# Patient Record
Sex: Female | Born: 1975
Health system: Southern US, Community
[De-identification: ages and names within clinical notes are randomized; demographics above are authoritative.]

## PROBLEM LIST (undated history)

## (undated) ENCOUNTER — Emergency Department (HOSPITAL_COMMUNITY): Disposition: A | Discharge status: Still In Hospital | Payer: BC Managed Care – PPO

## (undated) DIAGNOSIS — L709 Acne, unspecified: Secondary | ICD-10-CM

## (undated) DIAGNOSIS — M2550 Pain in unspecified joint: Secondary | ICD-10-CM

## (undated) DIAGNOSIS — F419 Anxiety disorder, unspecified: Secondary | ICD-10-CM

## (undated) DIAGNOSIS — R51 Headache: Secondary | ICD-10-CM

## (undated) DIAGNOSIS — Z975 Presence of (intrauterine) contraceptive device: Secondary | ICD-10-CM

## (undated) DIAGNOSIS — G4700 Insomnia, unspecified: Secondary | ICD-10-CM

## (undated) DIAGNOSIS — D259 Leiomyoma of uterus, unspecified: Secondary | ICD-10-CM

## (undated) DIAGNOSIS — E669 Obesity, unspecified: Secondary | ICD-10-CM

## (undated) DIAGNOSIS — I10 Essential (primary) hypertension: Secondary | ICD-10-CM

## (undated) DIAGNOSIS — Z8709 Personal history of other diseases of the respiratory system: Secondary | ICD-10-CM

## (undated) HISTORY — PX: HERNIA REPAIR: SHX51

## (undated) HISTORY — DX: Anxiety disorder, unspecified: F41.9

## (undated) HISTORY — PX: WISDOM TOOTH EXTRACTION: SHX21

## (undated) HISTORY — DX: Pain in unspecified joint: M25.50

## (undated) HISTORY — DX: Personal history of other diseases of the respiratory system: Z87.09

## (undated) HISTORY — DX: Headache: R51

## (undated) HISTORY — DX: Presence of (intrauterine) contraceptive device: Z97.5

## (undated) HISTORY — DX: Insomnia, unspecified: G47.00

## (undated) HISTORY — PX: BREAST REDUCTION SURGERY: SHX8

## (undated) HISTORY — DX: Acne, unspecified: L70.9

## (undated) HISTORY — DX: Leiomyoma of uterus, unspecified: D25.9

## (undated) HISTORY — DX: Obesity, unspecified: E66.9

## (undated) HISTORY — DX: Essential (primary) hypertension: I10

---

## 1997-10-03 ENCOUNTER — Inpatient Hospital Stay (HOSPITAL_COMMUNITY): Admission: AD | Admit: 1997-10-03 | Discharge: 1997-10-03 | Payer: Self-pay | Admitting: Obstetrics & Gynecology

## 1997-12-02 ENCOUNTER — Inpatient Hospital Stay (HOSPITAL_COMMUNITY): Admission: AD | Admit: 1997-12-02 | Discharge: 1997-12-02 | Payer: Self-pay

## 1998-01-08 ENCOUNTER — Inpatient Hospital Stay (HOSPITAL_COMMUNITY): Admission: AD | Admit: 1998-01-08 | Discharge: 1998-01-08 | Payer: Self-pay | Admitting: Obstetrics & Gynecology

## 1998-03-17 ENCOUNTER — Inpatient Hospital Stay (HOSPITAL_COMMUNITY): Admission: AD | Admit: 1998-03-17 | Discharge: 1998-03-17 | Payer: Self-pay | Admitting: *Deleted

## 1998-03-22 ENCOUNTER — Emergency Department (HOSPITAL_COMMUNITY): Admission: EM | Admit: 1998-03-22 | Discharge: 1998-03-22 | Payer: Self-pay | Admitting: Emergency Medicine

## 1998-04-09 ENCOUNTER — Inpatient Hospital Stay (HOSPITAL_COMMUNITY): Admission: AD | Admit: 1998-04-09 | Discharge: 1998-04-09 | Payer: Self-pay | Admitting: Obstetrics & Gynecology

## 1998-05-05 ENCOUNTER — Inpatient Hospital Stay (HOSPITAL_COMMUNITY): Admission: AD | Admit: 1998-05-05 | Discharge: 1998-05-05 | Payer: Self-pay | Admitting: *Deleted

## 1998-05-08 ENCOUNTER — Inpatient Hospital Stay (HOSPITAL_COMMUNITY): Admission: AD | Admit: 1998-05-08 | Discharge: 1998-05-08 | Payer: Self-pay | Admitting: Obstetrics

## 1998-05-09 ENCOUNTER — Inpatient Hospital Stay (HOSPITAL_COMMUNITY): Admission: AD | Admit: 1998-05-09 | Discharge: 1998-05-11 | Payer: Self-pay | Admitting: Obstetrics

## 1998-08-29 HISTORY — PX: REDUCTION MAMMAPLASTY: SUR839

## 1998-12-22 ENCOUNTER — Emergency Department (HOSPITAL_COMMUNITY): Admission: EM | Admit: 1998-12-22 | Discharge: 1998-12-22 | Payer: Self-pay | Admitting: Emergency Medicine

## 1998-12-30 ENCOUNTER — Encounter: Payer: Self-pay | Admitting: Emergency Medicine

## 1998-12-30 ENCOUNTER — Emergency Department (HOSPITAL_COMMUNITY): Admission: EM | Admit: 1998-12-30 | Discharge: 1998-12-30 | Payer: Self-pay | Admitting: Emergency Medicine

## 1999-08-12 ENCOUNTER — Inpatient Hospital Stay (HOSPITAL_COMMUNITY): Admission: EM | Admit: 1999-08-12 | Discharge: 1999-08-12 | Payer: Self-pay | Admitting: Obstetrics & Gynecology

## 1999-08-19 ENCOUNTER — Encounter: Admission: RE | Admit: 1999-08-19 | Discharge: 1999-08-19 | Payer: Self-pay | Admitting: Obstetrics

## 1999-08-19 ENCOUNTER — Other Ambulatory Visit: Admission: RE | Admit: 1999-08-19 | Discharge: 1999-08-19 | Payer: Self-pay | Admitting: Obstetrics

## 1999-09-16 ENCOUNTER — Encounter: Admission: RE | Admit: 1999-09-16 | Discharge: 1999-09-16 | Payer: Self-pay | Admitting: Obstetrics

## 1999-10-12 ENCOUNTER — Inpatient Hospital Stay (HOSPITAL_COMMUNITY): Admission: EM | Admit: 1999-10-12 | Discharge: 1999-10-12 | Payer: Self-pay | Admitting: *Deleted

## 2000-06-24 ENCOUNTER — Encounter (INDEPENDENT_AMBULATORY_CARE_PROVIDER_SITE_OTHER): Payer: Self-pay | Admitting: Specialist

## 2000-06-24 ENCOUNTER — Observation Stay (HOSPITAL_COMMUNITY): Admission: RE | Admit: 2000-06-24 | Discharge: 2000-06-25 | Payer: Self-pay | Admitting: Specialist

## 2001-09-09 ENCOUNTER — Emergency Department (HOSPITAL_COMMUNITY): Admission: EM | Admit: 2001-09-09 | Discharge: 2001-09-09 | Payer: Self-pay | Admitting: Emergency Medicine

## 2002-03-14 ENCOUNTER — Emergency Department (HOSPITAL_COMMUNITY): Admission: EM | Admit: 2002-03-14 | Discharge: 2002-03-15 | Payer: Self-pay | Admitting: Emergency Medicine

## 2002-04-09 ENCOUNTER — Inpatient Hospital Stay (HOSPITAL_COMMUNITY): Admission: AD | Admit: 2002-04-09 | Discharge: 2002-04-09 | Payer: Self-pay | Admitting: *Deleted

## 2002-05-29 ENCOUNTER — Inpatient Hospital Stay (HOSPITAL_COMMUNITY): Admission: AD | Admit: 2002-05-29 | Discharge: 2002-05-29 | Payer: Self-pay | Admitting: Obstetrics and Gynecology

## 2002-05-31 ENCOUNTER — Inpatient Hospital Stay (HOSPITAL_COMMUNITY): Admission: AD | Admit: 2002-05-31 | Discharge: 2002-05-31 | Payer: Self-pay | Admitting: Obstetrics and Gynecology

## 2003-01-04 ENCOUNTER — Inpatient Hospital Stay: Admission: AD | Admit: 2003-01-04 | Discharge: 2003-01-04 | Payer: Self-pay | Admitting: Family Medicine

## 2003-04-17 ENCOUNTER — Emergency Department (HOSPITAL_COMMUNITY): Admission: EM | Admit: 2003-04-17 | Discharge: 2003-04-17 | Payer: Self-pay | Admitting: Emergency Medicine

## 2003-06-10 ENCOUNTER — Emergency Department (HOSPITAL_COMMUNITY): Admission: EM | Admit: 2003-06-10 | Discharge: 2003-06-10 | Payer: Self-pay | Admitting: Emergency Medicine

## 2003-07-04 ENCOUNTER — Emergency Department (HOSPITAL_COMMUNITY): Admission: EM | Admit: 2003-07-04 | Discharge: 2003-07-04 | Payer: Self-pay | Admitting: Emergency Medicine

## 2003-11-23 ENCOUNTER — Emergency Department (HOSPITAL_COMMUNITY): Admission: EM | Admit: 2003-11-23 | Discharge: 2003-11-23 | Payer: Self-pay | Admitting: Emergency Medicine

## 2004-01-13 ENCOUNTER — Emergency Department (HOSPITAL_COMMUNITY): Admission: EM | Admit: 2004-01-13 | Discharge: 2004-01-13 | Payer: Self-pay | Admitting: Emergency Medicine

## 2004-01-28 ENCOUNTER — Emergency Department (HOSPITAL_COMMUNITY): Admission: EM | Admit: 2004-01-28 | Discharge: 2004-01-28 | Payer: Self-pay | Admitting: Family Medicine

## 2004-03-10 ENCOUNTER — Emergency Department (HOSPITAL_COMMUNITY): Admission: EM | Admit: 2004-03-10 | Discharge: 2004-03-10 | Payer: Self-pay | Admitting: Emergency Medicine

## 2004-07-24 ENCOUNTER — Emergency Department (HOSPITAL_COMMUNITY): Admission: EM | Admit: 2004-07-24 | Discharge: 2004-07-24 | Payer: Self-pay | Admitting: Emergency Medicine

## 2004-08-02 ENCOUNTER — Emergency Department (HOSPITAL_COMMUNITY): Admission: EM | Admit: 2004-08-02 | Discharge: 2004-08-02 | Payer: Self-pay

## 2005-01-01 ENCOUNTER — Emergency Department (HOSPITAL_COMMUNITY): Admission: EM | Admit: 2005-01-01 | Discharge: 2005-01-01 | Payer: Self-pay | Admitting: Family Medicine

## 2005-01-03 ENCOUNTER — Emergency Department (HOSPITAL_COMMUNITY): Admission: EM | Admit: 2005-01-03 | Discharge: 2005-01-03 | Payer: Self-pay | Admitting: Family Medicine

## 2006-08-21 ENCOUNTER — Inpatient Hospital Stay (HOSPITAL_COMMUNITY): Admission: AD | Admit: 2006-08-21 | Discharge: 2006-08-21 | Payer: Self-pay | Admitting: Obstetrics

## 2007-09-20 ENCOUNTER — Emergency Department (HOSPITAL_COMMUNITY): Admission: EM | Admit: 2007-09-20 | Discharge: 2007-09-20 | Payer: Self-pay | Admitting: Family Medicine

## 2007-09-25 ENCOUNTER — Emergency Department (HOSPITAL_COMMUNITY): Admission: EM | Admit: 2007-09-25 | Discharge: 2007-09-25 | Payer: Self-pay | Admitting: Emergency Medicine

## 2007-12-28 HISTORY — PX: REDUCTION MAMMAPLASTY: SUR839

## 2007-12-29 ENCOUNTER — Emergency Department (HOSPITAL_COMMUNITY): Admission: EM | Admit: 2007-12-29 | Discharge: 2007-12-29 | Payer: Self-pay | Admitting: Emergency Medicine

## 2008-01-08 ENCOUNTER — Ambulatory Visit (HOSPITAL_BASED_OUTPATIENT_CLINIC_OR_DEPARTMENT_OTHER): Admission: RE | Admit: 2008-01-08 | Discharge: 2008-01-08 | Payer: Self-pay | Admitting: Plastic Surgery

## 2008-01-08 ENCOUNTER — Encounter (INDEPENDENT_AMBULATORY_CARE_PROVIDER_SITE_OTHER): Payer: Self-pay | Admitting: Plastic Surgery

## 2008-12-18 ENCOUNTER — Inpatient Hospital Stay (HOSPITAL_COMMUNITY): Admission: AD | Admit: 2008-12-18 | Discharge: 2008-12-18 | Payer: Self-pay | Admitting: Obstetrics & Gynecology

## 2008-12-19 ENCOUNTER — Inpatient Hospital Stay (HOSPITAL_COMMUNITY): Admission: AD | Admit: 2008-12-19 | Discharge: 2008-12-19 | Payer: Self-pay | Admitting: Obstetrics and Gynecology

## 2009-03-18 ENCOUNTER — Inpatient Hospital Stay (HOSPITAL_COMMUNITY): Admission: RE | Admit: 2009-03-18 | Discharge: 2009-03-20 | Payer: Self-pay | Admitting: Obstetrics and Gynecology

## 2010-12-05 LAB — CBC
HCT: 36.2 % (ref 36.0–46.0)
HCT: 38.3 % (ref 36.0–46.0)
Hemoglobin: 12.2 g/dL (ref 12.0–15.0)
Hemoglobin: 13.1 g/dL (ref 12.0–15.0)
MCHC: 33.6 g/dL (ref 30.0–36.0)
MCHC: 34.1 g/dL (ref 30.0–36.0)
MCV: 93.9 fL (ref 78.0–100.0)
MCV: 94.5 fL (ref 78.0–100.0)
Platelets: 150 10*3/uL (ref 150–400)
Platelets: 168 10*3/uL (ref 150–400)
RBC: 3.83 MIL/uL — ABNORMAL LOW (ref 3.87–5.11)
RBC: 4.08 MIL/uL (ref 3.87–5.11)
RDW: 14.7 % (ref 11.5–15.5)
RDW: 15.3 % (ref 11.5–15.5)
WBC: 5.8 10*3/uL (ref 4.0–10.5)
WBC: 8.9 10*3/uL (ref 4.0–10.5)

## 2010-12-05 LAB — RPR: RPR Ser Ql: NONREACTIVE

## 2011-01-11 NOTE — Op Note (Signed)
NAME:  Alisha Coffey, Alisha Coffey                ACCOUNT NO.:  [id number]   MEDICAL RECORD NO.:  [id number]          PATIENT TYPE:  AMB   LOCATION:  DSC                          FACILITY:  [facility]   PHYSICIAN:  Mary Contogiannis, M.D.DATE OF BIRTH:  1976/03/31   DATE OF PROCEDURE:  01/08/2008  DATE OF DISCHARGE:                               OPERATIVE REPORT   PREOPERATIVE DIAGNOSIS:  Recurrent bilateral macromastia.   POSTOPERATIVE DIAGNOSIS:  Recurrent bilateral macromastia.   PROCEDURE:  Bilateral reduction mammoplasties.   ANESTHESIA:  General endotracheal.   ANESTHESIOLOGIST:  Guadalupe Maple, MD   ESTIMATED BLOOD LOSS:  250 mL.   FLUID REPLACEMENT:  3800 mL of crystalloid.   URINE OUTPUT:  220 mL.   COMPLICATIONS:  None.   INDICATIONS FOR PROCEDURE:  The patient is a 35 year old African  American female who has had a previous bilateral reduction mammoplasty  surgery.  She has recurrent bilateral macromastia that is clinically  symptomatic.  She presents to undergo a repeat bilateral reduction  mammoplasty.  Operative note indicates that she has had a previous  inferior pedicle technique and we will repeat the same technique with  this procedure.   PROCEDURE:  The patient was marked in the preop holding area for the  future bilateral reduction mammoplasties.  She was then taken back to  the OR and placed on the table in the supine position.  After adequate  general endotracheal anesthesia was obtained, the chest was prepped with  Techni-Care and draped in sterile fashion.  The base of the breasts were  injected with 1% lidocaine with epinephrine.  After adequate hemostasis,  anesthesia taken effect, the procedure was begun.   Both the breast reductions were performed in the following similar  manner.  The nipple-areolar complex was marked with a 45-mm nipple  marker.  This was then incised and the skin was then de-epithelialized  around the nipple-areolar complex down to the  inframammary crease in the  inferior pedicle pattern.  Next, the medial, superior, and lateral skin  flaps were elevated down to the chest wall.  Some of the skin and  dissection was limited due to the previous incisions.  The nipple-  areolar complex was examined and found to be pink and viable.  The wound  was irrigated with saline irrigation.  Meticulous hemostasis was  obtained with the Bovie electrocautery.  The inferior pedicle was  centralized using 3-0 Prolene suture.  A #10 JP flat fully fluted drain  was placed into the wound.  The skin flaps were brought together  inverted T junction with a 2-0 Prolene suture.  The incisions were  stapled for temporary closure.  The dermal layer was tacked together  using a 3-0 Monocryl suture.  The breasts were compared and found to  have good shape and symmetry.  The incision was closed from the medial  aspect of the JP drain to the medial aspect of the [facility] incision using a  quill 2-0 PDO barbed suture to close both the dermal and cuticular layer  in a single stitch.  Lateral to the JP drain  incision was closed with 3-  0 Monocryl in the dermal layer followed by a 3-0 Monocryl running  intracuticular stitch on the skin.   The patient was placed in the upright position.  She had had a mastopexy  after the previous bilateral reduction mammoplasty surgery in an attempt  to provide better symmetry to her breasts and nipples by the previous  surgeon and sewn to a certain extent.  Some of the preoperative markings  were limited by the previous abnormally high location of her nipple-  areolar complexes preop.  However, I adjusted this as best as possible  to give her a more normal positioning to the nipple-areolar complexes as  they were marked on the breast mounds.  The 42-mm nipple marker was used  for marking the future nipple-areolar complexes.  The patient was then  placed back into the recumbent position.   Both of the nipple-areolar  complexes were brought onto the breast mound  in the following similar manner.  The skin was incised as marked full  thickness into the subcutaneous tissues and removed.  The nipple-areolar  complex was examined and found to be pink and viable and brought out  through this aperture and sewn in place using 4-0 Monocryl in the dermal  layer followed by a 5-0 Monocryl running intracuticular stitch on the  skin.  The dermal layer of the vertical limb of the wise pattern  incision had been closed with a 3-0 Monocryl suture and then the  cuticular layer was closed with a 5-0 Monocryl suture in continuity with  the nipple-areolar closure.  The JP drain was sewn in place using 3-0  nylon suture.  The base of the breasts and skin and soft tissues were  then infiltrated with 0.5% Marcaine with epinephrine additionally into  the axillary areas.  There were no complications.  The patient tolerated  the procedure well.   The patient was then extubated and taken to the recovery room in stable  condition.  She was recovered without complications.  She does have the  option of staying vernight if she would like in the [facility] for aggressive  pain control along with ambulation and monitoring of the nipples and  breast flaps.  However, additionally, she also has the option of going  home if she feels like it.  The patient will decide on this and may  leave accordingly with her family if she would like.   Followup appointment will be on Friday in the office.           ______________________________  Brantley Persons, M.D.     MC/MEDQ  D:  01/08/2008  T:  01/09/2008  Job:  [id number]

## 2011-01-11 NOTE — Discharge Summary (Signed)
NAME:  Alisha Coffey, Alisha Coffey                ACCOUNT NO.:  [id number]   MEDICAL RECORD NO.:  [id number]          PATIENT TYPE:  INP   LOCATION:  9126                          FACILITY:  [facility]   PHYSICIAN:  Malachi Pro. Ambrose Mantle, M.D. DATE OF BIRTH:  1976/07/27   DATE OF ADMISSION:  03/18/2009  DATE OF DISCHARGE:  03/20/2009                               DISCHARGE SUMMARY   HISTORY:  This is a 35 year old black female, para 1-0-1-1 gravida 3,  estimated gestational age [redacted] weeks by 7-week ultrasound with EDC on March 24, 2009, presented for elective induction with a borderline favorable  cervix.  RPR nonreactive, hepatitis B surface antigen negative, rubella  equivocal, HIV negative, B positive blood group and type with a negative  antibody, GC negative, Chlamydia positive with a negative test of cure,  1-hour Glucola 86, and group B strep positive.  Prenatal care was  complicated by premature dilatation of the cervix with a negative fetal  fibronectin.  She received betamethasone at 26 weeks.   Her past medical history revealed no known allergies.  She had had a  breast reduction and wisdom teeth extraction.  She was on no medication.  She has a history of leiomyomata uteri.   OBSTETRICAL HISTORY:  In 1999, she had a spontaneous vaginal delivery at  40 weeks with a 7 pounds 13 ounces infant with no complications.  She  also had a spontaneous abortion.   On admission, her vital signs were normal.  Heart and lungs were normal.  The abdomen was soft.  Estimated fetal weight, by Dr. Jackelyn Knife, was  about 8 pounds, the cervix was 3 cm, 50%, and vertex at a -3.  The  patient was placed on penicillin and Pitocin by 1:25 p.m. she was  feeling some contractions.  Contractions every 3-5 minutes.  Cervix was  3-4 cm, 50%, and vertex at a -2 to -3.  Artificial rupture of membranes  produced clear fluid.  The patient progressed in active labor, received  an epidural, reached complete dilatation and pushed well  and had a  spontaneous vaginal delivery of a living female infant 7 pounds 0  ounces, Apgars of 9 at 1 and 9 at 5 minutes, Dr. Jackelyn Knife was in  attendance.  Placenta was spontaneous and intact.  Cord blood donation  was obtained.  Small periurethral laceration repaired with 3-0 Vicryl  for hemostasis.  Blood loss about 500 mL.  Postpartum, the patient did  well and was discharged on the second postpartum day.  RPR was  nonreactive.  Hemoglobin on admission 13.1, hematocrit 33, white count  58, 100, and platelet count 168,000.  Followup hemoglobin 12.2 and  platelet count 150,000.   FINAL DIAGNOSIS:  Intrauterine pregnancy at 39 weeks, delivered vertex.   OPERATION:  Spontaneous delivery vertex, repair of periurethral  laceration.   FINAL CONDITION:  Improved.   INSTRUCTIONS:  Our regular discharge instruction booklet.  The patient  was given Darvocet-N 100, 30 tablets 1 every 4-6 hours as needed for  pain and Motrin 600 mg 30 tablets 1 every 6 hours as needed for  pain at  discharge.  She was advised to return to the office in 6 weeks for  followup examination.      Malachi Pro. Ambrose Mantle, M.D.  Electronically Signed     TFH/MEDQ  D:  03/20/2009  T:  03/20/2009  Job:  [id number]

## 2011-01-14 NOTE — Op Note (Signed)
[facility]  Patient:    Alisha Coffey, Alisha Coffey                       MRN: [id number] Proc. Date: 06/24/00 Adm. Date:  [id number] Disc. Date: [id number] Attending:  Gustavus Messing                           Operative Report  INDICATIONS:                  The patient has increased macromastia, back and shoulder pain, secondary to large pendulous breasts.  She also has increased accessory breast tissue.  PROCEDURE PLANNED:            Bilateral breast reduction using the inferior pedicle technique.  SURGEON:                      Yaakov Guthrie. Shon Hough, M.D.  DESCRIPTION OF PROCEDURE:     Preoperatively the patient was set up and drawn for the inferior pedicle technique, remarking the areolar-nipple complexes back to 21.0 cm from the nipple-areolar complex, to the suprasternal notch. She then underwent general anesthesia and was intubated orally.  A prep was done to the chest and breast areas in a routine fashion, using Betadine soap and solution, and walled off with sterile towels and drapes, so as to make a sterile field.  Xylocaine 0.5% with epinephrine was injected locally, a total of 50 cc per side.  After this was placed in for vasoconstriction, the wounds were scored with a #15 blade.  The skin over the inferior pedicle was de-epithelialized with a #20 blade.  The medial and lateral fatty dermal pedicles were excised down to the underlying fascia.  After this the new key hole area was also debulked out laterally.  More accessory breast tissue was removed, and improved symmetry.  We removed over 1150 g on the left side, and approximately 950 g on the right side.  The wound were brought back together with #2-0 and #3-0 Vicryl subcutaneously, and the skin edges were reapproximated with a subcuticular stitch of #3-0 Vicryl and #5-0 Vicryl throughout the inverted T.  The wounds were drained with #10 Blake drains, which were placed in the depths of the  wound and brought out through the lateral-most portion of the incisions and secured with #3-0 Prolene.  She withstood the procedures very well and was taken to the recovery room in excellent condition.  Sterile dressings were applied to all the areas.  The estimated blood loss was 150 cc.  The patient had good nutrition on the end of her vasculature to her nipple-areolar complexes postoperatively. DD:  07/29/00 TD:  07/29/00 Job: [id number]

## 2011-01-14 NOTE — Discharge Summary (Signed)
[facility]  Patient:    ODETTA, FORNESS                       MRN: [id number] Adm. Date:  [id number] Disc. Date: [id number] Attending:  Gustavus Messing CC:         Yaakov Guthrie. Shon Hough, M.D. (2)   Discharge Summary  HOSPITAL COURSE:              This 35 year old lady underwent bilateral breast reductions on June 24, 2000, and did well postoperatively.  She was discharged from the hospital on June 25, 2000.  CONDITION ON DISCHARGE:  Improved.   DISCHARGE DIAGNOSIS:  Severe macromastia.  DISCHARGE MEDICATIONS: 1. Tylox one tablet p.o. q.4h. p.r.n. pain #30. 2. Duricef 500 mg b.i.d. for five days.  FOLLOWUP:  Follow up in my office in three days.  The patient is to call me for any medical problems. DD:  07/29/00 TD:  07/29/00 Job: [id number]

## 2011-10-04 ENCOUNTER — Ambulatory Visit (INDEPENDENT_AMBULATORY_CARE_PROVIDER_SITE_OTHER): Payer: BC Managed Care – PPO | Admitting: Medical

## 2011-10-04 ENCOUNTER — Encounter: Payer: Self-pay | Admitting: Medical

## 2011-10-04 VITALS — BP 128/88 | HR 80 | Temp 99.0°F | Resp 16 | Ht 66.0 in | Wt 199.0 lb

## 2011-10-04 DIAGNOSIS — L708 Other acne: Secondary | ICD-10-CM

## 2011-10-04 DIAGNOSIS — J309 Allergic rhinitis, unspecified: Secondary | ICD-10-CM | POA: Insufficient documentation

## 2011-10-04 DIAGNOSIS — G8929 Other chronic pain: Secondary | ICD-10-CM | POA: Insufficient documentation

## 2011-10-04 DIAGNOSIS — R21 Rash and other nonspecific skin eruption: Secondary | ICD-10-CM

## 2011-10-04 DIAGNOSIS — R51 Headache: Secondary | ICD-10-CM

## 2011-10-04 DIAGNOSIS — R519 Headache, unspecified: Secondary | ICD-10-CM | POA: Insufficient documentation

## 2011-10-04 DIAGNOSIS — L709 Acne, unspecified: Secondary | ICD-10-CM | POA: Insufficient documentation

## 2011-10-04 DIAGNOSIS — Z0000 Encounter for general adult medical examination without abnormal findings: Secondary | ICD-10-CM | POA: Insufficient documentation

## 2011-10-04 LAB — CBC WITH DIFFERENTIAL/PLATELET
Basophils Absolute: 0 10*3/uL (ref 0.0–0.1)
Basophils Relative: 0 % (ref 0–1)
Eosinophils Absolute: 0.1 10*3/uL (ref 0.0–0.7)
Eosinophils Relative: 2 % (ref 0–5)
HCT: 40.6 % (ref 36.0–46.0)
Hemoglobin: 13.4 g/dL (ref 12.0–15.0)
Lymphocytes Relative: 40 % (ref 12–46)
Lymphs Abs: 1.4 10*3/uL (ref 0.7–4.0)
MCH: 30.6 pg (ref 26.0–34.0)
MCHC: 33 g/dL (ref 30.0–36.0)
MCV: 92.7 fL (ref 78.0–100.0)
Monocytes Absolute: 0.2 10*3/uL (ref 0.1–1.0)
Monocytes Relative: 4 % (ref 3–12)
Neutro Abs: 1.9 10*3/uL (ref 1.7–7.7)
Neutrophils Relative %: 54 % (ref 43–77)
Platelets: 213 10*3/uL (ref 150–400)
RBC: 4.38 MIL/uL (ref 3.87–5.11)
RDW: 12.7 % (ref 11.5–15.5)
WBC: 3.5 10*3/uL — ABNORMAL LOW (ref 4.0–10.5)

## 2011-10-04 LAB — TSH: TSH: 0.76 u[IU]/mL (ref 0.350–4.500)

## 2011-10-04 LAB — LIPID PANEL
Cholesterol: 211 mg/dL — ABNORMAL HIGH (ref 0–200)
HDL: 42 mg/dL (ref >39)
LDL Cholesterol: 155 mg/dL — ABNORMAL HIGH (ref 0–99)
Total CHOL/HDL Ratio: 5 Ratio
Triglycerides: 69 mg/dL (ref <150)
VLDL: 14 mg/dL (ref 0–40)

## 2011-10-04 LAB — POCT URINALYSIS DIPSTICK
Bilirubin, UA: NEGATIVE
Glucose, UA: NEGATIVE
Ketones, UA: NEGATIVE
Leukocytes, UA: NEGATIVE
Nitrite, UA: NEGATIVE
Protein, UA: NEGATIVE
Spec Grav, UA: 1.015
Urobilinogen, UA: NEGATIVE
pH, UA: 7

## 2011-10-04 LAB — COMPREHENSIVE METABOLIC PANEL
ALT: 20 U/L (ref 0–35)
AST: 19 U/L (ref 0–37)
Albumin: 4.4 g/dL (ref 3.5–5.2)
Alkaline Phosphatase: 55 U/L (ref 39–117)
BUN: 11 mg/dL (ref 6–23)
CO2: 24 mEq/L (ref 19–32)
Calcium: 9.5 mg/dL (ref 8.4–10.5)
Chloride: 107 mEq/L (ref 96–112)
Creat: 0.79 mg/dL (ref 0.50–1.10)
Glucose, Bld: 92 mg/dL (ref 70–99)
Potassium: 4.2 mEq/L (ref 3.5–5.3)
Sodium: 139 mEq/L (ref 135–145)
Total Bilirubin: 0.5 mg/dL (ref 0.3–1.2)
Total Protein: 7.4 g/dL (ref 6.0–8.3)

## 2011-10-04 MED ORDER — CLINDAMYCIN PHOSPHATE 1 % EX GEL: Freq: Two times a day (BID) | CUTANEOUS | Status: AC

## 2011-10-04 MED ORDER — TRIAMCINOLONE ACETONIDE 0.1 % EX CREA: TOPICAL_CREAM | Freq: Two times a day (BID) | CUTANEOUS | Status: AC

## 2011-10-04 NOTE — Patient Instructions (Signed)
Rash - use the Triamcinolone cream I prescribed for the next week.  If not improving let me know.    Headache - try reduce stress where possible in your daily routine/life.  Try and start exercising regularly.   Avoid too much caffeine, don't skip meals.   Keep a headache diary, and begin Zyrtec 10mg  daily at bedtime OTC for allergies as this could also be contributing to your headaches.   If not improving in 1 mo, return with yore headache diary.   Acne - continue twice daily mild soap and water facial washes, continue Benzoyl Peroxide facial wash daily, and begin Clindagel prescription twice daily for the next month.    We will call with lab results.   Preventative Care for Adults - Female      MAINTAIN REGULAR HEALTH EXAMS:  A routine yearly physical is a good way to check in with your primary care provider about your health and preventive screening. It is also an opportunity to share updates about your health and any concerns you have, and receive a thorough all-over exam.   Most health insurance companies pay for at least some preventative services.  Check with your health plan for specific coverages.  WHAT PREVENTATIVE SERVICES DO WOMEN NEED?  Adult women should have their weight and blood pressure checked regularly.   Women age 25 and older should have their cholesterol levels checked regularly.  Women should be screened for cervical cancer with a Pap smear and pelvic exam beginning at either age 79, or 3 years after they become sexually activity.    Breast cancer screening generally begins at age 33 with a mammogram and breast exam by your primary care provider.    Beginning at age 6 and continuing to age 85, women should be screened for colorectal cancer.  Certain people may need continued testing until age 6.  Updating vaccinations is part of preventative care.  Vaccinations help protect against diseases such as the flu.  Osteoporosis is a disease in which the bones lose  minerals and strength as we age. Women ages 17 and over should discuss this with their caregivers, as should women after menopause who have other risk factors.  Lab tests are generally done as part of preventative care to screen for anemia and blood disorders, to screen for problems with the kidneys and liver, to screen for bladder problems, to check blood sugar, and to check your cholesterol level.  Preventative services generally include counseling about diet, exercise, avoiding tobacco, drugs, excessive alcohol consumption, and sexually transmitted infections.    GENERAL RECOMMENDATIONS FOR GOOD HEALTH:  Healthy diet:  Eat a variety of foods, including fruit, vegetables, animal or vegetable protein, such as meat, fish, chicken, and eggs, or beans, lentils, tofu, and grains, such as rice.  Drink plenty of water daily.  Decrease saturated fat in the diet, avoid lots of red meat, processed foods, sweets, fast foods, and fried foods.  Exercise:  Aerobic exercise helps maintain good heart health. At least 30-40 minutes of moderate-intensity exercise is recommended. For example, a brisk walk that increases your heart rate and breathing. This should be done on most days of the week.   Find a type of exercise or a variety of exercises that you enjoy so that it becomes a part of your daily life.  Examples are running, walking, swimming, water aerobics, and biking.  For motivation and support, explore group exercise such as aerobic class, spin class, Zumba, Yoga,or  martial arts, etc.  Set exercise goals for yourself, such as a certain weight goal, walk or run in a race such as a 5k walk/run.  Speak to your primary care provider about exercise goals.  Disease prevention:  If you smoke or chew tobacco, find out from your caregiver how to quit. It can literally save your life, no matter how long you have been a tobacco user. If you do not use tobacco, never begin.   Maintain a healthy diet and  normal weight. Increased weight leads to problems with blood pressure and diabetes.   The Body Mass Index or BMI is a way of measuring how much of your body is fat. Having a BMI above 27 increases the risk of heart disease, diabetes, hypertension, stroke and other problems related to obesity. Your caregiver can help determine your BMI and based on it develop an exercise and dietary program to help you achieve or maintain this important measurement at a healthful level.  High blood pressure causes heart and blood vessel problems.  Persistent high blood pressure should be treated with medicine if weight loss and exercise do not work.   Fat and cholesterol leaves deposits in your arteries that can block them. This causes heart disease and vessel disease elsewhere in your body.  If your cholesterol is found to be high, or if you have heart disease or certain other medical conditions, then you may need to have your cholesterol monitored frequently and be treated with medication.   Ask if you should have a cardiac stress test if your history suggests this. A stress test is a test done on a treadmill that looks for heart disease. This test can find disease prior to there being a problem.  Menopause can be associated with physical symptoms and risks. Hormone replacement therapy is available to decrease these. You should talk to your caregiver about whether starting or continuing to take hormones is right for you.   Osteoporosis is a disease in which the bones lose minerals and strength as we age. This can result in serious bone fractures. Risk of osteoporosis can be identified using a bone density scan. Women ages 39 and over should discuss this with their caregivers, as should women after menopause who have other risk factors. Ask your caregiver whether you should be taking a calcium supplement and Vitamin D, to reduce the rate of osteoporosis.   Avoid drinking alcohol in excess (more than two drinks per  day).  Avoid use of street drugs. Do not share needles with anyone. Ask for professional help if you need assistance or instructions on stopping the use of alcohol, cigarettes, and/or drugs.  Brush your teeth twice a day with fluoride toothpaste, and floss once a day. Good oral hygiene prevents tooth decay and gum disease. The problems can be painful, unattractive, and can cause other health problems. Visit your dentist for a routine oral and dental check up and preventive care every 6-12 months.   Look at your skin regularly.  Use a mirror to look at your back. Notify your caregivers of changes in moles, especially if there are changes in shapes, colors, a size larger than a pencil eraser, an irregular border, or development of new moles.  Safety:  Use seatbelts 100% of the time, whether driving or as a passenger.  Use safety devices such as hearing protection if you work in environments with loud noise or significant background noise.  Use safety glasses when doing any work that could send debris in to the  eyes.  Use a helmet if you ride a bike or motorcycle.  Use appropriate safety gear for contact sports.  Talk to your caregiver about gun safety.  Use sunscreen with a SPF (or skin protection factor) of 15 or greater.  Lighter skinned people are at a greater risk of skin cancer. Don't forget to also wear sunglasses in order to protect your eyes from too much damaging sunlight. Damaging sunlight can accelerate cataract formation.   Practice safe sex. Use condoms. Condoms are used for birth control and to help reduce the spread of sexually transmitted infections (or STIs).  Some of the STIs are gonorrhea (the clap), chlamydia, syphilis, trichomonas, herpes, HPV (human papilloma virus) and HIV (human immunodeficiency virus) which causes AIDS. The herpes, HIV and HPV are viral illnesses that have no cure. These can result in disability, cancer and death.   Keep carbon monoxide and smoke detectors in  your home functioning at all times. Change the batteries every 6 months or use a model that plugs into the wall.   Vaccinations:  Stay up to date with your tetanus shots and other required immunizations. You should have a booster for tetanus every 10 years. Be sure to get your flu shot every year, since 5%-20% of the [place] population comes down with the flu. The flu vaccine changes each year, so being vaccinated once is not enough. Get your shot in the fall, before the flu season peaks.   Other vaccines to consider:  Human Papilloma Virus or HPV causes cancer of the cervix, and other infections that can be transmitted from person to person. There is a vaccine for HPV, and females should get immunized between the ages of 18 and 10. It requires a series of 3 shots.   Pneumococcal vaccine to protect against certain types of pneumonia.  This is normally recommended for adults age 31 or older.  However, adults younger than 34 years old with certain underlying conditions such as diabetes, heart or lung disease should also receive the vaccine.  Shingles vaccine to protect against Varicella Zoster if you are older than age 4, or younger than 34 years old with certain underlying illness.  Hepatitis A vaccine to protect against a form of infection of the liver by a virus acquired from food.  Hepatitis B vaccine to protect against a form of infection of the liver by a virus acquired from blood or body fluids, particularly if you work in health care.  If you plan to travel internationally, check with your local health department for specific vaccination recommendations.  Cancer Screening:  Breast cancer screening is essential to preventive care for women. All women age 17 and older should perform a breast self-exam every month. At age 74 and older, women should have their caregiver complete a breast exam each year. Women at ages 2 and older should have a mammogram (x-ray film) of the breasts. Your  caregiver can discuss how often you need mammograms.    Cervical cancer screening includes taking a Pap smear (sample of cells examined under a microscope) from the cervix (end of the uterus). It also includes testing for HPV (Human Papilloma Virus, which can cause cervical cancer). Screening and a pelvic exam should begin at age 33, or 3 years after a woman becomes sexually active. Screening should occur every year, with a Pap smear but no HPV testing, up to age 69. After age 32, you should have a Pap smear every 3 years with HPV testing, if no HPV  was found previously.   Most routine colon cancer screening begins at the age of 24. On a yearly basis, doctors may provide special easy to use take-home tests to check for hidden blood in the stool. Sigmoidoscopy or colonoscopy can detect the earliest forms of colon cancer and is life saving. These tests use a small camera at the end of a tube to directly examine the colon. Speak to your caregiver about this at age 72, when routine screening begins (and is repeated every 5 years unless early forms of pre-cancerous polyps or small growths are found).

## 2011-10-04 NOTE — Progress Notes (Signed)
Subjective:   HPI  Alisha Coffey is a 36 y.o. female who presents for a complete physical.  Here as a new patient today.  No primary care in 10+ years.  Just would go to hospital in past for acute illness.  She has multiple concerns today.     She notes being overweight, wants help with losing weight.  She notes rash on her left abdomen that comes and goes.  It does improve with hydrocortisone, but it will come back.  Denies blisters, it is itchy at times though, no particular triggers or exposures.  She notes fatigue for months.  She exercise about 15 minutes daily with walking her dog.   She notes headaches ongoing for 3 years.  Headaches started after her last pregnancy.  Gets headache about 1-2/wk, last 3-5 days, usually one sided, and at times gets blurry vision.  She does have some allergy symptoms with runny nose, nasal congestion, and some sneezing.   She drinks 2 cups of coffee daily.  She uses Ibuprofen or Excedrin for headaches.  She gets boils under her arms.  Has had to have some lanced in the past.  No hx/o MRSA.  She sees Dr. Jackelyn Knife for gynecology, going for pap smear today, has IUD in place.  Last eye exam 2009, last dental visit last month.    Reviewed their medical, surgical, family, social, medication, and allergy history and updated chart as appropriate.  Past Medical History  Diagnosis Date  . History of sinusitis   . Chronic headache   . Joint pain   . IUD (intrauterine device) in place     Mirena  . Uterine fibroid     Past Surgical History  Procedure Date  . Wisdom tooth extraction   . Breast reduction surgery 2009, 2000    Family History  Problem Relation Age of Onset  . Diabetes Mother     borderline  . Diabetes Father     borderline  . Depression Maternal Grandmother   . Hypertension Maternal Grandmother   . Diabetes Maternal Grandfather   . Hypertension Maternal Grandfather   . Stroke Maternal Aunt   . Cancer Neg Hx   . Heart disease Neg Hx      History   Social History  . Marital Status: Single    Spouse Name: N/A    Number of Children: N/A  . Years of Education: N/A   Occupational History  . retailer     [employer]   Social History Main Topics  . Smoking status: Never Smoker   . Smokeless tobacco: Not on file  . Alcohol Use: 0.6 oz/week    1 Glasses of wine per week  . Drug Use: No  . Sexually Active: Not on file   Other Topics Concern  . Not on file   Social History Narrative   2 daughters, Elizbeth Squires age 27, Timaria age 2yo    No current outpatient prescriptions on file prior to visit.    No Known Allergies  Review of Systems Constitutional: -fever, -chills, -sweats, -unexpected weight change, -anorexia, +fatigue Allergy: +sneezing, -itching, +congestion Dermatology: denies changing moles, rash, lumps, no new worrisome lesions ENT: -runny nose, -ear pain, -sore throat, -hoarseness, -sinus pain, -teeth pain, -tinnitus, -hearing loss, -epistaxis Cardiology:  -chest pain, -palpitations, -edema, -orthopnea, -paroxysmal nocturnal dyspnea Respiratory: -cough, -shortness of breath, -dyspnea on exertion, -wheezing, -hemoptysis Gastroenterology: -abdominal pain, -nausea, -vomiting, -diarrhea, -constipation, -blood in stool, -changes in bowel movement, -dysphagia Hematology: -bleeding or bruising problems  Musculoskeletal: -arthralgias, +myalgias, -joint swelling, -back pain, -neck pain, -cramping, -gait changes Ophthalmology: -vision changes, -eye redness, -itching, -discharge Urology: -dysuria, -difficulty urinating, -hematuria, -urinary frequency, -urgency, incontinence Neurology: +headache, -weakness, +tingling, +numbness, -speech abnormality, -memory loss, -falls, -dizziness Psychology:  -depressed mood, -agitation, +sleep problems     Objective:   Physical Exam  Filed Vitals:   10/04/11 0859  BP: 128/88  Pulse: 80  Temp: 99 F (37.2 C)  Resp: 16    General appearance: alert, no distress, WD/WN,  overweight black female, pleasant Skin: tattoos on right wrist, upper arm, behind left ear, left lower abdomen/flank with large patch of raised papular rough skin without induration, warmth, or fluctuance, otherwise no worrisome lesions HEENT: normocephalic, conjunctiva/corneas normal, sclerae anicteric, PERRLA, EOMi, nares patent, no discharge or erythema, pharynx normal Oral cavity: MMM, tongue normal, teeth in good repair Neck: supple, no lymphadenopathy, no thyromegaly, no masses, normal ROM, no bruits Chest: non tender, normal shape and expansion Heart: RRR, normal S1, S2, no murmurs Lungs: CTA bilaterally, no wheezes, rhonchi, or rales Abdomen: +bs, soft, non tender, non distended, no masses, no hepatomegaly, no splenomegaly, no bruits Back: non tender, normal ROM, no scoliosis Musculoskeletal: upper extremities non tender, no obvious deformity, normal ROM throughout, lower extremities non tender, no obvious deformity, normal ROM throughout Extremities: no edema, no cyanosis, no clubbing Pulses: 2+ symmetric, upper and lower extremities, normal cap refill Neurological: alert, oriented x 3, CN2-12 intact, strength normal upper extremities and lower extremities, sensation normal throughout, DTRs 2+ throughout, no cerebellar signs, gait normal Psychiatric: normal affect, behavior normal, pleasant  Breast, gyn, rectal - deferred to gyn   Assessment and Plan :    Encounter Diagnoses  Name Primary?  . Routine general medical examination at a health care facility Yes  . Acne   . Rash   . Chronic headaches   . Allergic rhinitis    Physical exam - discussed healthy lifestyle, diet, exercise, preventative care, vaccinations, and addressed their concerns.  Handout given.  Acne - BID facial wash with mild soap and water, dailiy OTC benzoyl peroxide cleanser, and begin Clindamycin gel x 38mo.  Recheck 38mo.  Rash - script for triamcinolone, rash appears inflammatory from unknown  trigger.  Chronic headaches - begin Zyrtec QHS, keep headache diary, discussed stress reduction, increasing exercise, avoid too much caffeine, and recheck 38mo.  Allergic rhinitis - begin Zyrtec QHS.  Follow-up pending labs, RTC 38mo.

## 2011-11-04 ENCOUNTER — Other Ambulatory Visit: Payer: Self-pay | Admitting: Medical

## 2011-11-04 ENCOUNTER — Telehealth: Payer: Self-pay | Admitting: Medical

## 2011-11-04 MED ORDER — OSELTAMIVIR PHOSPHATE 75 MG PO CAPS: 75.0000 mg | ORAL_CAPSULE | Freq: Two times a day (BID) | ORAL | Status: AC

## 2011-11-04 NOTE — Telephone Encounter (Signed)
PT'S 2 KIDS JUST GOT OVER THE FLU. THEY WERE ON TAMIFLU FOR IT. [facility] CAN VERIFY THIS INFO PER PT. ONE OF THE KIDS HAVE BEEN SLEEPING WITH HER. YESTERDAY STARTED SNEEZING. 3:00 AM THIS MORNING FEVER STARTED. NOW HAS RUNNY NOSE, CHILLS, BODY ACHES ALL OVER. PT [place] AND UNABLE TO GET OFF WORK FOR APPT UNTIL 6:00 TODAY AND REALIZE WE WILL BE CLOSED. WANT TO KNOW IF [facility] CAN AUTHORIZE TAMIFLU MED BASED ON SYMPTOMS JUST STARTING TODAY AND KNOWING KIDS HAD FLU. PEDIATRIC OFFICE CAN VERIFY THAT INFO IF [facility] NEED TO DO SO.            [email or web address] [address]

## 2011-11-04 NOTE — Telephone Encounter (Signed)
tamiflu sent

## 2012-08-25 ENCOUNTER — Encounter (HOSPITAL_COMMUNITY): Payer: Self-pay | Admitting: Emergency Medicine

## 2012-08-25 ENCOUNTER — Emergency Department (HOSPITAL_COMMUNITY)
Admission: EM | Admit: 2012-08-25 | Discharge: 2012-08-25 | Disposition: A | Discharge status: Home / Self Care | Payer: BC Managed Care – PPO | Attending: Emergency Medicine | Admitting: Emergency Medicine

## 2012-08-25 DIAGNOSIS — Z309 Encounter for contraceptive management, unspecified: Secondary | ICD-10-CM | POA: Insufficient documentation

## 2012-08-25 DIAGNOSIS — R51 Headache: Secondary | ICD-10-CM | POA: Insufficient documentation

## 2012-08-25 DIAGNOSIS — Z975 Presence of (intrauterine) contraceptive device: Secondary | ICD-10-CM | POA: Insufficient documentation

## 2012-08-25 DIAGNOSIS — Z8709 Personal history of other diseases of the respiratory system: Secondary | ICD-10-CM | POA: Insufficient documentation

## 2012-08-25 DIAGNOSIS — M25519 Pain in unspecified shoulder: Secondary | ICD-10-CM | POA: Insufficient documentation

## 2012-08-25 DIAGNOSIS — T148XXA Other injury of unspecified body region, initial encounter: Secondary | ICD-10-CM

## 2012-08-25 DIAGNOSIS — Z8742 Personal history of other diseases of the female genital tract: Secondary | ICD-10-CM | POA: Insufficient documentation

## 2012-08-25 DIAGNOSIS — Y929 Unspecified place or not applicable: Secondary | ICD-10-CM | POA: Insufficient documentation

## 2012-08-25 DIAGNOSIS — M2550 Pain in unspecified joint: Secondary | ICD-10-CM | POA: Insufficient documentation

## 2012-08-25 DIAGNOSIS — Z791 Long term (current) use of non-steroidal anti-inflammatories (NSAID): Secondary | ICD-10-CM | POA: Insufficient documentation

## 2012-08-25 DIAGNOSIS — S139XXA Sprain of joints and ligaments of unspecified parts of neck, initial encounter: Secondary | ICD-10-CM | POA: Insufficient documentation

## 2012-08-25 DIAGNOSIS — Y33XXXA Other specified events, undetermined intent, initial encounter: Secondary | ICD-10-CM | POA: Insufficient documentation

## 2012-08-25 DIAGNOSIS — IMO0002 Reserved for concepts with insufficient information to code with codable children: Secondary | ICD-10-CM | POA: Insufficient documentation

## 2012-08-25 DIAGNOSIS — Y939 Activity, unspecified: Secondary | ICD-10-CM | POA: Insufficient documentation

## 2012-08-25 DIAGNOSIS — Z978 Presence of other specified devices: Secondary | ICD-10-CM | POA: Insufficient documentation

## 2012-08-25 MED ORDER — DIAZEPAM 5 MG PO TABS: 5.0000 mg | ORAL_TABLET | Freq: Two times a day (BID) | ORAL | Status: DC

## 2012-08-25 NOTE — ED Provider Notes (Signed)
History     CSN: [id number]  Arrival date & time 08/25/12  1903   First MD Initiated Contact with Patient 08/25/12 1933      Chief Complaint  Patient presents with  . Neck Pain  . Shoulder Pain    (Consider location/radiation/quality/duration/timing/severity/associated sxs/prior treatment) HPI Comments: Patient reports that she has been having pain over the right trapezius for the past 5 days.  She reports that she has been under increased stress.  The pain radiates to the right side of her neck.  Pain worse with movement.  She has applied ice and heat to the area, which she feels helps.  She has also been taking Ibuprofen, which she does not feel helps.  She denies acute injury.  Denies numbness or tingling.  Denies chest pain.  Denies fever or chills.  Denies weakness.    Patient is a 36 y.o. female presenting with neck pain and shoulder pain. The history is provided by the patient.  Neck Pain  Pertinent negatives include no numbness.  Shoulder Pain Associated symptoms include neck pain. Pertinent negatives include no chills, fever, nausea, numbness, rash or vomiting.    Past Medical History  Diagnosis Date  . History of sinusitis   . Chronic headache   . Joint pain   . IUD (intrauterine device) in place     Mirena  . Uterine fibroid     Past Surgical History  Procedure Date  . Wisdom tooth extraction   . Breast reduction surgery 2009, 2000    Family History  Problem Relation Age of Onset  . Diabetes Mother     borderline  . Diabetes Father     borderline  . Depression Maternal Grandmother   . Hypertension Maternal Grandmother   . Diabetes Maternal Grandfather   . Hypertension Maternal Grandfather   . Stroke Maternal Aunt   . Cancer Neg Hx   . Heart disease Neg Hx     History  Substance Use Topics  . Smoking status: Never Smoker   . Smokeless tobacco: Not on file  . Alcohol Use: 0.6 oz/week    1 Glasses of wine per week    OB History    Grav Para  Term Preterm Abortions TAB SAB Ect Mult Living                  Review of Systems  Constitutional: Negative for fever and chills.  HENT: Positive for neck pain.   Gastrointestinal: Negative for nausea and vomiting.  Musculoskeletal:       Right upper back pain  Skin: Negative for color change and rash.  Neurological: Negative for numbness.    Allergies  Darvocet  Home Medications   Current Outpatient Rx  Name  Route  Sig  Dispense  Refill  . IBUPROFEN 200 MG PO TABS   Oral   Take 800 mg by mouth every 6 (six) hours as needed. Pain         . CLINDAMYCIN PHOSPHATE 1 % EX GEL   Topical   Apply topically 2 (two) times daily.   30 g   0   . TRIAMCINOLONE ACETONIDE 0.1 % EX CREA   Topical   Apply topically 2 (two) times daily.   30 g   0     BP 130/77  Pulse 77  Temp 99.3 F (37.4 C) (Oral)  Resp 14  SpO2 100%  LMP 07/29/2012  Physical Exam  Nursing note and vitals reviewed. Constitutional: She appears well-developed  and well-nourished. No distress.  HENT:  Head: Normocephalic and atraumatic.  Neck: Normal range of motion. Neck supple.       Pain of the right upper back with ROM of the neck  Cardiovascular: Normal rate, regular rhythm and normal heart sounds.   Pulmonary/Chest: Effort normal and breath sounds normal.  Musculoskeletal:       Tightness and knots of the right trapezius muscle palpated.  Neurological: She is alert. No sensory deficit. Gait normal.       Grip strength 5/5 bilaterally  Skin: Skin is warm and dry. She is not diaphoretic.  Psychiatric: She has a normal mood and affect.    ED Course  Procedures (including critical care time)  Labs Reviewed - No data to display No results found.   No diagnosis found.    MDM  Signs and symptoms consistent with muscle strain.  Patient encouraged to continue using heat and ice.  Patient instructed to continue using NSAIDs and given prescription for muscle relaxer.          Pascal Lux [place], PA-C 08/25/12 2116

## 2012-08-25 NOTE — ED Notes (Signed)
Pt states she has neck and shoulder pain that started on Monday  Pt states she had a headache to start with  Pt states she has had sharp shooting pain from her shoulder up to her ear on the left side  Pt states it hurts worse if she bends down to pick something up and her shoulder area looks swollen to her  Denies injury

## 2012-08-26 NOTE — ED Provider Notes (Signed)
Medical screening examination/treatment/procedure(s) were performed by non-physician practitioner and as supervising physician I was immediately available for consultation/collaboration.   Bradely Rudin T Kyandra Mcclaine, MD 08/26/12 0011 

## 2012-12-10 ENCOUNTER — Encounter: Payer: Self-pay | Admitting: Medical

## 2013-02-14 ENCOUNTER — Emergency Department (HOSPITAL_COMMUNITY)
Admission: EM | Admit: 2013-02-14 | Discharge: 2013-02-14 | Disposition: A | Discharge status: Home / Self Care | Payer: BC Managed Care – PPO | Attending: Emergency Medicine | Admitting: Emergency Medicine

## 2013-02-14 ENCOUNTER — Encounter (HOSPITAL_COMMUNITY): Payer: Self-pay | Admitting: *Deleted

## 2013-02-14 DIAGNOSIS — Z8742 Personal history of other diseases of the female genital tract: Secondary | ICD-10-CM | POA: Insufficient documentation

## 2013-02-14 DIAGNOSIS — M62838 Other muscle spasm: Secondary | ICD-10-CM

## 2013-02-14 DIAGNOSIS — Z8679 Personal history of other diseases of the circulatory system: Secondary | ICD-10-CM | POA: Insufficient documentation

## 2013-02-14 DIAGNOSIS — Z79899 Other long term (current) drug therapy: Secondary | ICD-10-CM | POA: Insufficient documentation

## 2013-02-14 DIAGNOSIS — Y92410 Unspecified street and highway as the place of occurrence of the external cause: Secondary | ICD-10-CM | POA: Insufficient documentation

## 2013-02-14 DIAGNOSIS — Y9389 Activity, other specified: Secondary | ICD-10-CM | POA: Insufficient documentation

## 2013-02-14 DIAGNOSIS — M5380 Other specified dorsopathies, site unspecified: Secondary | ICD-10-CM | POA: Insufficient documentation

## 2013-02-14 DIAGNOSIS — Z8709 Personal history of other diseases of the respiratory system: Secondary | ICD-10-CM | POA: Insufficient documentation

## 2013-02-14 MED ORDER — IBUPROFEN 600 MG PO TABS: 600.0000 mg | ORAL_TABLET | Freq: Four times a day (QID) | ORAL | Status: DC | PRN

## 2013-02-14 MED ORDER — METHOCARBAMOL 500 MG PO TABS: 1000.0000 mg | ORAL_TABLET | Freq: Four times a day (QID) | ORAL | Status: DC

## 2013-02-14 MED ORDER — IBUPROFEN 400 MG PO TABS
400.0000 mg | ORAL_TABLET | Freq: Once | ORAL | Status: AC
  Administered 2013-02-14: 400 mg via ORAL
  Filled 2013-02-14: qty 1
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
## 2013-02-14 NOTE — ED Provider Notes (Signed)
Medical screening examination/treatment/procedure(s) were performed by non-physician practitioner and as supervising physician I was immediately available for consultation/collaboration.  Lundon Rosier, MD 02/14/13 1544 

## 2013-02-14 NOTE — ED Notes (Addendum)
C/o "hurting all over" especially neck and arms. Pt in no distress.
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
## 2013-02-14 NOTE — ED Notes (Signed)
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

## 2013-05-18 ENCOUNTER — Emergency Department (HOSPITAL_COMMUNITY)
Admission: EM | Admit: 2013-05-18 | Discharge: 2013-05-18 | Disposition: A | Discharge status: Home / Self Care | Payer: BC Managed Care – PPO | Attending: Emergency Medicine | Admitting: Emergency Medicine

## 2013-05-18 ENCOUNTER — Encounter (HOSPITAL_COMMUNITY): Payer: Self-pay | Admitting: Emergency Medicine

## 2013-05-18 DIAGNOSIS — IMO0002 Reserved for concepts with insufficient information to code with codable children: Secondary | ICD-10-CM | POA: Insufficient documentation

## 2013-05-18 DIAGNOSIS — Z8709 Personal history of other diseases of the respiratory system: Secondary | ICD-10-CM | POA: Insufficient documentation

## 2013-05-18 DIAGNOSIS — Z8742 Personal history of other diseases of the female genital tract: Secondary | ICD-10-CM | POA: Insufficient documentation

## 2013-05-18 DIAGNOSIS — L02412 Cutaneous abscess of left axilla: Secondary | ICD-10-CM

## 2013-05-18 DIAGNOSIS — M2550 Pain in unspecified joint: Secondary | ICD-10-CM | POA: Insufficient documentation

## 2013-05-18 DIAGNOSIS — Z975 Presence of (intrauterine) contraceptive device: Secondary | ICD-10-CM | POA: Insufficient documentation

## 2013-05-18 DIAGNOSIS — G8929 Other chronic pain: Secondary | ICD-10-CM | POA: Insufficient documentation

## 2013-05-18 MED ORDER — SULFAMETHOXAZOLE-TMP DS 800-160 MG PO TABS: 1.0000 | ORAL_TABLET | Freq: Two times a day (BID) | ORAL | Status: DC

## 2013-05-18 MED ORDER — OXYCODONE-ACETAMINOPHEN 5-325 MG PO TABS: 1.0000 | ORAL_TABLET | Freq: Four times a day (QID) | ORAL | Status: DC | PRN

## 2013-05-18 NOTE — ED Provider Notes (Signed)
CSN: [id number]     Arrival date & time 05/18/13  1545 History  This chart was scribed for non-physician practitioner Marlon Pel working with Gilda Crease, by Carl Best, ED Scribe. This patient was seen in room WTR6/WTR6 and the patient's care was started at 3:55PM    Chief Complaint  Patient presents with  . Abscess    Patient is a 37 y.o. female presenting with abscess. The history is provided by the patient. No language interpreter was used.  Abscess Associated symptoms: no fever and no vomiting    HPI Comments: Temika D Peeler is a 37 y.o. female who presents to the Emergency Department complaining of an abscess located under her left armpit that has persisted for the past 3 weeks.  Patient states that the area is very tender to palpation and feels as though it is pulsating.  She states that the pain radiates through her entire arm.  She denies emesis and fever as associated symptoms.  Patient states she has a family and personal history of abscesses and has had an abscess in the same location about 10 years ago.     Past Medical History  Diagnosis Date  . History of sinusitis   . Chronic headache   . Joint pain   . IUD (intrauterine device) in place     Mirena  . Uterine fibroid    Past Surgical History  Procedure Laterality Date  . Wisdom tooth extraction    . Breast reduction surgery  2009, 2000   Family History  Problem Relation Age of Onset  . Diabetes Mother     borderline  . Diabetes Father     borderline  . Depression Maternal Grandmother   . Hypertension Maternal Grandmother   . Diabetes Maternal Grandfather   . Hypertension Maternal Grandfather   . Stroke Maternal Aunt   . Cancer Neg Hx   . Heart disease Neg Hx    History  Substance Use Topics  . Smoking status: Never Smoker   . Smokeless tobacco: Not on file  . Alcohol Use: 0.6 oz/week    1 Glasses of wine per week   OB History   Grav Para Term Preterm Abortions TAB SAB Ect Mult  Living                 Review of Systems  Constitutional: Negative for fever.  Gastrointestinal: Negative for vomiting.  Musculoskeletal:       Abscess located in the left armpit.   All other systems reviewed and are negative.    Allergies  Darvocet and Valium  Home Medications   Current Outpatient Rx  Name  Route  Sig  Dispense  Refill  . ALPRAZolam (XANAX) 0.5 MG tablet   Oral   Take 0.5 mg by mouth at bedtime as needed.         Marland Kitchen levonorgestrel (MIRENA) 20 MCG/24HR IUD   Intrauterine   1 each by Intrauterine route once.         Marland Kitchen oxyCODONE-acetaminophen (PERCOCET/ROXICET) 5-325 MG per tablet   Oral   Take 1 tablet by mouth every 6 (six) hours as needed for pain.   15 tablet   0   . sulfamethoxazole-trimethoprim (BACTRIM DS) 800-160 MG per tablet   Oral   Take 1 tablet by mouth 2 (two) times daily.   10 tablet   0    Triage Vitals: BP 155/94  Pulse 76  Temp(Src) 98.9 F (37.2 C) (Oral)  Resp 17  SpO2 100%  LMP 05/18/2013 Physical Exam  Nursing note and vitals reviewed. Constitutional: She is oriented to person, place, and time. She appears well-developed and well-nourished. No distress.  HENT:  Head: Normocephalic and atraumatic.  Eyes: EOM are normal.  Neck: Neck supple. No tracheal deviation present.  Cardiovascular: Normal rate.   Pulmonary/Chest: Effort normal. No respiratory distress.  Musculoskeletal: Normal range of motion.  Neurological: She is alert and oriented to person, place, and time.  Skin: Skin is warm and dry.     Psychiatric: She has a normal mood and affect. Her behavior is normal.    ED Course  Procedures (including critical care time)  DIAGNOSTIC STUDIES: Oxygen Saturation is 100% on room air, normal by my interpretation.    COORDINATION OF CARE: 3:58 PM- Discussed numbing the area and draining the abscess with the patient at the patient's bedside and patient agreed to the treatment plan.  Discussed a clinical  suspicion of a genetic condition that causes a swelling in the sweat glands.   4:10 PM- Drained the abscess and discussed discharging the patient with a prescription for antibiotics, Vicodin, and a referral to surgery.  Patient agreed to treatment plan.    Medications - No data to display  Labs Review Labs Reviewed - No data to display Imaging Review No results found.  MDM   1. Abscess of axilla, left     INCISION AND DRAINAGE Performed by: Dorthula Matas Consent: Verbal consent obtained. Risks and benefits: risks, benefits and alternatives were discussed Type: abscess  Body area: left axilla  Anesthesia: local infiltration  Incision was made with a scalpel.  Local anesthetic: lidocaine 2 % with epinephrine  Anesthetic total: 3 ml  Complexity: complex Blunt dissection to break up loculations  Drainage: purulent  Drainage amount:    Patient tolerance: Patient tolerated the procedure well with no immediate complications.    37 y.o.Ryana D Stairs's evaluation in the Emergency Department is complete. It has been determined that no acute conditions requiring further emergency intervention are present at this time. The patient/guardian have been advised of the diagnosis and plan. We have discussed signs and symptoms that warrant return to the ED, such as changes or worsening in symptoms.  Vital signs are stable at discharge. Filed Vitals:   05/18/13 1557  BP: 155/94  Pulse: 76  Temp: 98.9 F (37.2 C)  Resp: 17    Patient/guardian has voiced understanding and agreed to follow-up with the PCP or specialist.  I personally performed the services described in this documentation, which was scribed in my presence. The recorded information has been reviewed and is accurate.   Dorthula Matas, PA-C 05/18/13 1906

## 2013-05-18 NOTE — ED Notes (Signed)
Pt c/o abscess under lt arm x 3 wks.

## 2013-05-18 NOTE — ED Provider Notes (Signed)
Medical screening examination/treatment/procedure(s) were performed by non-physician practitioner and as supervising physician I was immediately available for consultation/collaboration.    Nocole Zammit J. Makena Murdock, MD 05/18/13 2220 

## 2013-07-20 ENCOUNTER — Encounter (HOSPITAL_COMMUNITY): Payer: Self-pay | Admitting: Emergency Medicine

## 2013-07-20 ENCOUNTER — Emergency Department (HOSPITAL_COMMUNITY)
Admission: EM | Admit: 2013-07-20 | Discharge: 2013-07-20 | Disposition: A | Discharge status: Home / Self Care | Payer: BC Managed Care – PPO | Attending: Emergency Medicine | Admitting: Emergency Medicine

## 2013-07-20 DIAGNOSIS — L02412 Cutaneous abscess of left axilla: Secondary | ICD-10-CM

## 2013-07-20 DIAGNOSIS — IMO0002 Reserved for concepts with insufficient information to code with codable children: Secondary | ICD-10-CM | POA: Insufficient documentation

## 2013-07-20 DIAGNOSIS — Z79899 Other long term (current) drug therapy: Secondary | ICD-10-CM | POA: Insufficient documentation

## 2013-07-20 MED ORDER — DOXYCYCLINE HYCLATE 100 MG PO CAPS: 100.0000 mg | ORAL_CAPSULE | Freq: Two times a day (BID) | ORAL | Status: DC

## 2013-07-20 MED ORDER — OXYCODONE-ACETAMINOPHEN 5-325 MG PO TABS: 1.0000 | ORAL_TABLET | ORAL | Status: DC | PRN

## 2013-07-20 NOTE — ED Provider Notes (Signed)
CSN: [id number]     Arrival date & time 07/20/13  1638 History   None     This chart was scribed for non-physician practitioner, Jaynie Crumble, PA-C, working with Roney Marion, MD by Arlan Organ, ED Scribe. This patient was seen in room WTR8/WTR8 and the patient's care was started at 5:36 PM.   Chief Complaint  Patient presents with  . Abscess    left axilla   The history is provided by the patient. No language interpreter was used.   HPI Comments: Alisha Coffey is a 37 y.o. female with a hx of abscesses who presents to the Emergency Department complaining of a new gradual onset, gradually worsening, constant abscess to the left axilla that first appeared 3 days ago. She describes the pain as "throbbing", and feels she may have more than 1 abscess to the area. She states on Thursday she noticed swelling to the area, and attempted to try a warm compress with no relief. Pt says she was treated 2 months ago for an abscess in the same area, and was put on antibiotics. She states she has made an appointment with a dermatologist, but is not due to be seen until February.  Past Medical History  Diagnosis Date  . History of sinusitis   . Chronic headache   . Joint pain   . IUD (intrauterine device) in place     Mirena  . Uterine fibroid    Past Surgical History  Procedure Laterality Date  . Wisdom tooth extraction    . Breast reduction surgery  2009, 2000   Family History  Problem Relation Age of Onset  . Diabetes Mother     borderline  . Diabetes Father     borderline  . Depression Maternal Grandmother   . Hypertension Maternal Grandmother   . Diabetes Maternal Grandfather   . Hypertension Maternal Grandfather   . Stroke Maternal Aunt   . Cancer Neg Hx   . Heart disease Neg Hx    History  Substance Use Topics  . Smoking status: Never Smoker   . Smokeless tobacco: Never Used  . Alcohol Use: 0.6 oz/week    1 Glasses of wine per week   OB History   Grav Para Term  Preterm Abortions TAB SAB Ect Mult Living                 Review of Systems  Constitutional: Negative for fever and chills.  Skin: Positive for wound (abscess ).  All other systems reviewed and are negative.    Allergies  Darvocet and Valium  Home Medications   Current Outpatient Rx  Name  Route  Sig  Dispense  Refill  . ALPRAZolam (XANAX) 0.5 MG tablet   Oral   Take 0.5 mg by mouth at bedtime as needed.         Marland Kitchen levonorgestrel (MIRENA) 20 MCG/24HR IUD   Intrauterine   1 each by Intrauterine route once.         Marland Kitchen oxyCODONE-acetaminophen (PERCOCET/ROXICET) 5-325 MG per tablet   Oral   Take 1 tablet by mouth every 6 (six) hours as needed for pain.   15 tablet   0   . sulfamethoxazole-trimethoprim (BACTRIM DS) 800-160 MG per tablet   Oral   Take 1 tablet by mouth 2 (two) times daily.   10 tablet   0    Triage Vitals: BP 129/90  Pulse 94  Temp(Src) 98.5 F (36.9 C) (Oral)  Resp 16  SpO2 100%  LMP 07/15/2013  Physical Exam  Nursing note and vitals reviewed. Constitutional: She is oriented to person, place, and time. She appears well-developed and well-nourished.  HENT:  Head: Normocephalic and atraumatic.  Eyes: EOM are normal.  Neck: Normal range of motion.  Cardiovascular: Normal rate.   Pulmonary/Chest: Effort normal.  Musculoskeletal: Normal range of motion.  Neurological: She is alert and oriented to person, place, and time.  Skin: Skin is warm and dry.  4x4cm abscess to the left axilla with surrounding induration, erythema. No drainage.   Psychiatric: She has a normal mood and affect. Her behavior is normal.    ED Course  Procedures (including critical care time)  DIAGNOSTIC STUDIES: Oxygen Saturation is 100% on RA, Normal by my interpretation.    COORDINATION OF CARE: 5:54 PM-Discussed treatment plan with pt at bedside and pt agreed to plan.     Labs Review Labs Reviewed - No data to display Imaging Review No results found.  INCISION  AND DRAINAGE Performed by: Jaynie Crumble A Consent: Verbal consent obtained. Risks and benefits: risks, benefits and alternatives were discussed Type: abscess  Body area: left axilla  Anesthesia: local infiltration  Incision was made with a scalpel.  Local anesthetic: lidocaine 2% wo epinephrine  Anesthetic total: 4 ml  Complexity: complex Blunt dissection to break up loculations  Drainage: purulent  Drainage amount: large  Packing material: 1/4 in iodoform gauze  Patient tolerance: Patient tolerated the procedure well with no immediate complications.    EKG Interpretation   None       MDM   1. Abscess of left axilla     Pt with left axilla abscess, recurrent. I&Ded in ED with large drainage. Pt is otherwise afebrile. Non toxic. Will start on doxycycline for infection. Home with follow up with general surgery per request. Return in two days as needed or if worsening.   Filed Vitals:   07/20/13 1644  BP: 129/90  Pulse: 94  Temp: 98.5 F (36.9 C)  TempSrc: Oral  Resp: 16  SpO2: 100%    I personally performed the services described in this documentation, which was scribed in my presence. The recorded information has been reviewed and is accurate.   Lottie Mussel, PA-C 07/20/13 1935

## 2013-07-20 NOTE — ED Notes (Signed)
Pt states she started having pain under left arm Wednesday past.  Pt states on Thursday she noticed swelling to area and she applied warm compresses and Draw Out Salve that she got at a pharmacy.  Pt has been treated for abscess in same area 2 months ago.  She has an appt with a dermatologist in February as that was the first available appt.  Pt's PCP recommends that pt have her sweat glands removed so as to avoid future abscesses.  Pt has raised red area under left arm.  Pt describes pain as throbbing.  Pt endorses chills, but has not measured temperature.  Pt endorses N/V today.

## 2013-07-24 NOTE — ED Provider Notes (Signed)
Medical screening examination/treatment/procedure(s) were performed by non-physician practitioner and as supervising physician I was immediately available for consultation/collaboration.  EKG Interpretation   None         Demeisha Geraghty J Katlin Ciszewski, MD 07/24/13 0726 

## 2013-12-02 ENCOUNTER — Encounter: Payer: Self-pay | Admitting: Medical

## 2013-12-02 ENCOUNTER — Ambulatory Visit (INDEPENDENT_AMBULATORY_CARE_PROVIDER_SITE_OTHER): Payer: BC Managed Care – PPO | Admitting: Medical

## 2013-12-02 VITALS — BP 132/90 | HR 88 | Temp 98.4°F | Resp 16 | Ht 65.0 in | Wt 204.0 lb

## 2013-12-02 DIAGNOSIS — Z0000 Encounter for general adult medical examination without abnormal findings: Secondary | ICD-10-CM

## 2013-12-02 DIAGNOSIS — L708 Other acne: Secondary | ICD-10-CM

## 2013-12-02 DIAGNOSIS — R51 Headache: Secondary | ICD-10-CM

## 2013-12-02 DIAGNOSIS — G4700 Insomnia, unspecified: Secondary | ICD-10-CM

## 2013-12-02 DIAGNOSIS — R030 Elevated blood-pressure reading, without diagnosis of hypertension: Secondary | ICD-10-CM

## 2013-12-02 DIAGNOSIS — R519 Headache, unspecified: Secondary | ICD-10-CM

## 2013-12-02 DIAGNOSIS — G8929 Other chronic pain: Secondary | ICD-10-CM

## 2013-12-02 DIAGNOSIS — L709 Acne, unspecified: Secondary | ICD-10-CM

## 2013-12-02 DIAGNOSIS — E669 Obesity, unspecified: Secondary | ICD-10-CM

## 2013-12-02 LAB — POCT URINALYSIS DIPSTICK
Bilirubin, UA: NEGATIVE
Glucose, UA: NEGATIVE
Ketones, UA: NEGATIVE
Nitrite, UA: NEGATIVE
Protein, UA: NEGATIVE
Spec Grav, UA: 1.01
Urobilinogen, UA: NEGATIVE
pH, UA: 7

## 2013-12-02 LAB — CBC WITH DIFFERENTIAL/PLATELET
Basophils Absolute: 0 10*3/uL (ref 0.0–0.1)
Basophils Relative: 1 % (ref 0–1)
Eosinophils Absolute: 0.1 10*3/uL (ref 0.0–0.7)
Eosinophils Relative: 2 % (ref 0–5)
HCT: 41.8 % (ref 36.0–46.0)
Hemoglobin: 13.6 g/dL (ref 12.0–15.0)
Lymphocytes Relative: 40 % (ref 12–46)
Lymphs Abs: 1.4 10*3/uL (ref 0.7–4.0)
MCH: 30.6 pg (ref 26.0–34.0)
MCHC: 32.5 g/dL (ref 30.0–36.0)
MCV: 93.9 fL (ref 78.0–100.0)
Monocytes Absolute: 0.1 10*3/uL (ref 0.1–1.0)
Monocytes Relative: 4 % (ref 3–12)
Neutro Abs: 1.8 10*3/uL (ref 1.7–7.7)
Neutrophils Relative %: 53 % (ref 43–77)
Platelets: 206 10*3/uL (ref 150–400)
RBC: 4.45 MIL/uL (ref 3.87–5.11)
RDW: 13.3 % (ref 11.5–15.5)
WBC: 3.4 10*3/uL — ABNORMAL LOW (ref 4.0–10.5)

## 2013-12-02 LAB — COMPREHENSIVE METABOLIC PANEL
ALT: 24 U/L (ref 0–35)
AST: 19 U/L (ref 0–37)
Albumin: 4.2 g/dL (ref 3.5–5.2)
Alkaline Phosphatase: 46 U/L (ref 39–117)
BUN: 12 mg/dL (ref 6–23)
CO2: 24 mEq/L (ref 19–32)
Calcium: 9.1 mg/dL (ref 8.4–10.5)
Chloride: 105 mEq/L (ref 96–112)
Creat: 0.77 mg/dL (ref 0.50–1.10)
Glucose, Bld: 93 mg/dL (ref 70–99)
Potassium: 4.5 mEq/L (ref 3.5–5.3)
Sodium: 136 mEq/L (ref 135–145)
Total Bilirubin: 0.4 mg/dL (ref 0.2–1.2)
Total Protein: 7 g/dL (ref 6.0–8.3)

## 2013-12-02 LAB — LIPID PANEL
Cholesterol: 185 mg/dL (ref 0–200)
HDL: 54 mg/dL (ref >39)
LDL Cholesterol: 116 mg/dL — ABNORMAL HIGH (ref 0–99)
Total CHOL/HDL Ratio: 3.4 Ratio
Triglycerides: 77 mg/dL (ref <150)
VLDL: 15 mg/dL (ref 0–40)

## 2013-12-02 MED ORDER — SUVOREXANT 10 MG PO TABS: 1.0000 | ORAL_TABLET | Freq: Every evening | ORAL | Status: DC | PRN

## 2013-12-02 MED ORDER — CLINDAMYCIN PHOSPHATE 1 % EX GEL: Freq: Two times a day (BID) | CUTANEOUS | Status: DC

## 2013-12-02 NOTE — Progress Notes (Signed)
Subjective:   HPI  Alisha Coffey is a 38 y.o. female who presents for a complete physical.  Medical care team includes:  Dorothea Ogle, PA-C here for primary care  [facility]ne[facility]    Preventative care: Last ophthalmology visit:yes- [facility] Last dental visit:yes- [facility]. Last colonoscopy:n/a Last mammogram:2 years ago Last gynecological exam: appt. 12/03/2013 Last EKG:n/a Last labs:09/2011  Prior vaccinations: TD or Tdap:2011 with birth of last child Influenza:yes Pneumococcal:n/a Shingles/Zostavaxn/a:  Advanced directive:n/a Health care power of attorney:n/a Living will:n/a  Concerns: Headaches onging - still having frequent headaches, still several per week.    Sleep problems - mind won't shut down at bedtime, mulls over the activities of the day.  Takes hours sometimes to get to sleep.  Doesn't sleep well in general.  No prior sleep aid medication  Obesity - despite eating healthy diet, exercise several days per week, can't seem to lose weight  Acne - still has ongoing problem with moderate acne of face.  Reviewed their medical, surgical, family, social, medication, and allergy history and updated chart as appropriate.    Review of Systems Constitutional: -fever, -chills, -sweats, +unexpected weight change, -decreased appetite, -fatigue Allergy: -sneezing, -itching, -congestion Dermatology: +changing moles, --rash, -lumps ENT: -runny nose, -ear pain, -sore throat, -hoarseness, -sinus pain, -teeth pain, - ringing in ears, -hearing loss, -nosebleeds Cardiology: -chest pain, -palpitations, -swelling, -difficulty breathing when lying flat, -waking up short of breath Respiratory: -cough, -shortness of breath, -difficulty breathing with exercise or exertion, -wheezing, -coughing up blood Gastroenterology: -abdominal pain, -nausea, -vomiting, -diarrhea, -constipation, -blood in stool, -changes in bowel movement, -difficulty swallowing or eating Hematology:  -bleeding, -bruising  Musculoskeletal: +joint aches, -muscle aches, -joint swelling, -back pain, -neck pain, -cramping, -changes in gait Ophthalmology: denies vision changes, eye redness, itching, discharge Urology: -burning with urination, -difficulty urinating, -blood in urine, -urinary frequency, -urgency, -incontinence Neurology: +headache, -weakness, +tingling, -numbness, -memory loss, -falls, -dizziness Psychology: -depressed mood, -agitation, +sleep problems     Objective:   Physical Exam  BP 132/90  Pulse 88  Temp(Src) 98.4 F (36.9 C) (Oral)  Resp 16  Ht 5\' 5"  (1.651 m)  Wt 204 lb (92.534 kg)  BMI 33.95 kg/m2  General appearance: alert, no distress, WD/WN, overweight black female, pleasant  Skin: tattoos on right wrist and forearm, right lateral upper arm, star tattoo x 3 on left posterior ear lobe, butterfly tattoo posterior lower neck/upper back, few scattered macules, moderate facial acne, no other worrisome lesions  HEENT: normocephalic, conjunctiva/corneas normal, sclerae anicteric, PERRLA, EOMi, nares patent, no discharge or erythema, pharynx normal  Oral cavity: MMM, tongue normal, teeth in good repair  Neck: supple, no lymphadenopathy, no thyromegaly, no masses, normal ROM, no bruits  Chest: non tender, normal shape and expansion  Heart: RRR, normal S1, S2, no murmurs  Lungs: CTA bilaterally, no wheezes, rhonchi, or rales  Abdomen: +bs, soft, non tender, non distended, no masses, no hepatomegaly, no splenomegaly, no bruits  Back: non tender, normal ROM, no scoliosis  Musculoskeletal: upper extremities non tender, no obvious deformity, normal ROM throughout, lower extremities non tender, no obvious deformity, normal ROM throughout  Extremities: no edema, no cyanosis, no clubbing  Pulses: 2+ symmetric, upper and lower extremities, normal cap refill  Neurological: alert, oriented x 3, CN2-12 intact, strength normal upper extremities and lower extremities, sensation  normal throughout, DTRs 2+ throughout, no cerebellar signs, gait normal  Psychiatric: normal affect, behavior normal, pleasant  Breast, gyn, rectal - deferred to gyn    Assessment  and Plan :    Encounter Diagnoses  Name Primary?  . Routine general medical examination at a health care facility Yes  . Insomnia   . Obesity, unspecified   . Chronic headache   . Acne       Physical exam - discussed healthy lifestyle, diet, exercise, preventative care, vaccinations, and addressed their concerns.  See gynecology as usual/yearly. Insomnia - disucssed stress reduction, sleep hygiene, exercise, night time routine.  Begin samples of Belsomra as sleep aid obesity - discussed diet, exercise, possible Qsymia medication to help, working on sleep, good water intake Chronic headache - again will focus on improving sleep and stress reduction.  Consider Qsymia for both weight loss and the benefit of Topamax for headache control Acne - restart Clindagel. Elevated BP - monitor. Follow-up pending labs

## 2013-12-03 LAB — TSH: TSH: 1.082 u[IU]/mL (ref 0.350–4.500)

## 2013-12-09 ENCOUNTER — Telehealth: Payer: Self-pay | Admitting: Family Medicine

## 2013-12-09 NOTE — Telephone Encounter (Signed)
Patient states that the sleep medication did not work and she tried all of the samples. She said it had her up and down all night. She said she doesn't want to try this medication any more but she would try something. She said you talk about some medication that would with HA, sleep, and weigh loss. CLS

## 2013-12-10 ENCOUNTER — Other Ambulatory Visit: Payer: Self-pay | Admitting: Medical

## 2013-12-10 MED ORDER — ESZOPICLONE 2 MG PO TABS: 2.0000 mg | ORAL_TABLET | Freq: Every evening | ORAL | Status: DC | PRN

## 2013-12-10 NOTE — Telephone Encounter (Signed)
See if we have samples of Ambien 10mg  or Lunesta 2mg  she can try instead of Belsomra.  Give 4-5 samples tablets if we have them.    Have her f/u OV in 2wk to recheck on sleep and discuss Qysmia and sleep aid.

## 2013-12-10 NOTE — Telephone Encounter (Signed)
Patient states that she is still having those Headaches. Can you give her something for that? CLS  Patient is aware of your message and she would like to [place]. There was not any samples in the closet. Will print a Rx so I can mail it to her she can a 7 day supply for free. CLS

## 2014-02-24 ENCOUNTER — Telehealth: Payer: Self-pay | Admitting: Medical

## 2014-02-24 ENCOUNTER — Other Ambulatory Visit: Payer: Self-pay | Admitting: Medical

## 2014-02-24 NOTE — Telephone Encounter (Signed)
Pt scheduled an follow up appt for early July. Needs refills on Lunesta and clindagel sent to [facility] [address].

## 2014-02-24 NOTE — Telephone Encounter (Signed)
Call out both.

## 2014-02-25 ENCOUNTER — Other Ambulatory Visit: Payer: Self-pay | Admitting: Family Medicine

## 2014-02-25 MED ORDER — CLINDAMYCIN PHOSPHATE 1 % EX GEL: Freq: Two times a day (BID) | CUTANEOUS | Status: DC

## 2014-02-25 NOTE — Telephone Encounter (Signed)
Is this okay to refill? 

## 2014-02-25 NOTE — Telephone Encounter (Signed)
I called out Lunesta to her pharmacy per Chana Bode PAC. CLS

## 2014-02-25 NOTE — Telephone Encounter (Signed)
I called out Lunesta to her pharmacy and I sent the refill into her pharmacy for the gel. CLS

## 2014-02-25 NOTE — Telephone Encounter (Signed)
Call it in 

## 2014-03-06 ENCOUNTER — Encounter: Payer: Self-pay | Admitting: Medical

## 2014-03-06 ENCOUNTER — Ambulatory Visit (INDEPENDENT_AMBULATORY_CARE_PROVIDER_SITE_OTHER): Payer: BC Managed Care – PPO | Admitting: Medical

## 2014-03-06 VITALS — BP 128/70 | HR 74 | Temp 97.9°F | Resp 16 | Wt 199.0 lb

## 2014-03-06 DIAGNOSIS — R030 Elevated blood-pressure reading, without diagnosis of hypertension: Secondary | ICD-10-CM

## 2014-03-06 DIAGNOSIS — R5383 Other fatigue: Secondary | ICD-10-CM

## 2014-03-06 DIAGNOSIS — E669 Obesity, unspecified: Secondary | ICD-10-CM

## 2014-03-06 DIAGNOSIS — L708 Other acne: Secondary | ICD-10-CM

## 2014-03-06 DIAGNOSIS — G8929 Other chronic pain: Secondary | ICD-10-CM

## 2014-03-06 DIAGNOSIS — R5381 Other malaise: Secondary | ICD-10-CM

## 2014-03-06 DIAGNOSIS — L709 Acne, unspecified: Secondary | ICD-10-CM

## 2014-03-06 DIAGNOSIS — R51 Headache: Secondary | ICD-10-CM

## 2014-03-06 DIAGNOSIS — R519 Headache, unspecified: Secondary | ICD-10-CM

## 2014-03-06 DIAGNOSIS — G4700 Insomnia, unspecified: Secondary | ICD-10-CM

## 2014-03-06 MED ORDER — PHENTERMINE-TOPIRAMATE ER 3.75-23 MG PO CP24: 1.0000 | ORAL_CAPSULE | ORAL | Status: DC

## 2014-03-06 MED ORDER — PHENTERMINE-TOPIRAMATE ER 7.5-46 MG PO CP24: 1.0000 | ORAL_CAPSULE | ORAL | Status: DC

## 2014-03-06 MED ORDER — TRAZODONE HCL 50 MG PO TABS: ORAL_TABLET | ORAL | Status: DC

## 2014-03-06 MED ORDER — CLINDAMYCIN PHOSPHATE 1 % EX GEL: Freq: Two times a day (BID) | CUTANEOUS | Status: DC

## 2014-03-06 MED ORDER — ESZOPICLONE 2 MG PO TABS: 2.0000 mg | ORAL_TABLET | Freq: Every day | ORAL | Status: DC

## 2014-03-06 NOTE — Patient Instructions (Signed)
  Thank you for giving me the opportunity to serve you today.    Specific recommendations today include: Insomnia - begin Trazodone for sleep, 1/2-1 tablet at bedtime daily.  Acne - continue daily facial washes and continue Clindagel twice daily  Diet  Increase your water intake, get at least 64 ounces of water daily  Eat 3-4 fruits daily  Eat plenty of vegetables throughout the day, preferably each meal  Eat good sources of grains such as oatmeal, barley, whole grain pasta, whole grain bread, but limit the serving size to 1 cup of oatmeal or pasta per meal or 2 slices of bread per meal  We don't need to meat at each meal, however if you do eat meat, limit serving size to the size of your palm, and eat chicken fish or [place], lean cuts of meat  Eat beans every day as this is a good nutrient source and helps to curb appetite  Consider using a program such as Weight Watchers  Consider using a Smart phone app such as My [employer] or [employer] to track your calories and progress   Things to limit or avoid:  Avoid fast food, fried foods, fatty foods  Limit sweets, ice cream, cake and other baked goods  Avoid soda, beer, alcohol, sweet tea  Exercise  You need to be exercising most days of the week for 30-45 minutes or more  Good forms of exercise include walking, hiking, stationary bike or bicycling outside, lap swimming, aerobics class, dance, Zumba  Consider getting a trainer at a gym to help with exercise  Medication  Begin Qsymia weight loss medication  Start by taking the Qsymia 3.75/23 mg dose, once daily in the morning before breakfast for the first 2 weeks  Then increase to the Qsymia 7.5/46mg  dose, once daily in the morning before breakfast  You will need to use the coupon card to call and activate the medication  If your insurance does not cover the weight loss medicine listed above, check on the insurance coverage for the following  medications:  Belviq  Contrave  Consider weighing yourself daily to keep track of your weight   Recheck in 2 months, sooner if needed.

## 2014-03-06 NOTE — Progress Notes (Signed)
Subjective:    Patient ID: Alisha Coffey, female    DOB: 17-Jul-1976, 38 y.o.   MRN: [id number]  HPI  Patient is presenting for 3 month follow up from last complete physical exam.  Obesity - patient exercises regularly, 4 days a week, walking on treadmill, plays volleyball, walks her dog around neighborhood and occasionally jogs. She feels stuck at her weight and is frustrated with not seeing her weight go down despite exercise. 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 Her diet is "a lot better" than last visit - eats chicken, fish, vegetables, avoids starchy foods like potatoes, pasta. Portions are well controlled - no larger than the size of her fist. She eats 3 meals a day with healthy snacks in between.   Headaches - still having headaches intermittently and last 3-4 days. She last had 2 headaches in June. They are typically constant, sharp pain, usually left-sided, intermittent floaters, some photophobia. Denies aura, n/v, phonophobia, ringing in the ears, dizziness, difficulty with balance. Drinks up to 1 cup of coffee when she has her headaches with some relief. Drinks 9 bottles of water per day, drinks 1 alcoholic beverage per month, does not smoke. She has these types of headaches for the last 5 years and feels that they have progressed in nature over the years. However, she admits that with her recent changes in diet, exercise, sleep and using Excedrin Migraine have decreased the frequency as of late.    Acne - patient has been using clindagel, initially worked for 2 weeks but thereafter felt her acne returned. Continued use of the clindagel has not affected her breakouts.   Insomnia - patient did not tolerate Belsomra and was started on Lunesta. She has been taking it for the last 2 months. Patient reports that it helps her fall asleep but she wakes up after 4-5 hours. Reports good sleep hygiene including no electronics, dark cool room, no eating or drinking fluids 2 hours before  going to bed, exercises before 6:00pm.  Patient denies any other questions or concerns.   Review of Systems As in subjective.    Objective:   Physical Exam  BP 128/70  Pulse 74  Temp(Src) 97.9 F (36.6 C) (Oral)  Resp 16  Wt 199 lb (90.266 kg)  Wt Readings from Last 3 Encounters:  03/06/14 199 lb (90.266 kg)  12/02/13 204 lb (92.534 kg)  02/14/13 190 lb (86.183 kg)   BP Readings from Last 3 Encounters:  03/06/14 128/70  12/02/13 132/90  07/20/13 129/90   General appearence: alert, no distress, WD/WN,  HEENT: normocephalic, sclerae anicteric, PERRLA, EOMi, nares patent, no discharge or erythema, pharynx normal Oral cavity: MMM, no lesions Neck: supple, no lymphadenopathy, no thyromegaly, no masses Heart: RRR, normal S1, S2, no murmurs Lungs: CTA bilaterally, no wheezes, rhonchi, or rales Extremities: no edema, no cyanosis, no clubbing Pulses: 2+ symmetric, upper and lower extremities, normal cap refill Neurological: alert, oriented x 3, CN2-12 intact, strength normal upper extremities and lower extremities, sensation normal throughout, DTRs 2+ throughout, no cerebellar signs, gait normal Psychiatric: normal affect, behavior normal, pleasant        Assessment & Plan:   Encounter Diagnoses  Name Primary?  . Obesity, unspecified Yes  . Chronic nonintractable headache, unspecified headache type   . Insomnia   . Acne, unspecified acne type   . Other malaise and fatigue   . Elevated blood pressure reading without diagnosis of hypertension    Obesity - discussed medication  options, discussed risk and benefits of Qsymia, begin Qsymia.  C/t healthy diet and exercise as discussed.  Recheck 1-2 mo.  Chronic headaches - glad to see there has been some improvmeent.  C/t healthier diet, exericse, and begin Qsymia which includes Topamax.   Insomnia - stop lunesta.  Didn't tolerate Belsomra.  Begin Trazodone.  Discussed risks/benefits of the medication  Acne - continue  Clindagel for now, consider Duac or minocycline going forward if needed  Fatigue - c/t healthy diet, exercise, work on getting better sleep.  Elevated BP at last visit, normal today.  C/t monitor on future visits.  Patient was seen in conjunction with PA student Jaynee Eagles, and I have also evaluated and examined patient, agree with student's notes, student supervised by me.

## 2014-03-30 ENCOUNTER — Emergency Department (HOSPITAL_COMMUNITY)
Admission: EM | Admit: 2014-03-30 | Discharge: 2014-03-30 | Disposition: A | Discharge status: Home / Self Care | Payer: BC Managed Care – PPO | Attending: Emergency Medicine | Admitting: Emergency Medicine

## 2014-03-30 ENCOUNTER — Encounter (HOSPITAL_COMMUNITY): Payer: Self-pay | Admitting: Emergency Medicine

## 2014-03-30 DIAGNOSIS — Z872 Personal history of diseases of the skin and subcutaneous tissue: Secondary | ICD-10-CM | POA: Insufficient documentation

## 2014-03-30 DIAGNOSIS — K089 Disorder of teeth and supporting structures, unspecified: Secondary | ICD-10-CM | POA: Insufficient documentation

## 2014-03-30 DIAGNOSIS — Z8739 Personal history of other diseases of the musculoskeletal system and connective tissue: Secondary | ICD-10-CM | POA: Insufficient documentation

## 2014-03-30 DIAGNOSIS — K029 Dental caries, unspecified: Secondary | ICD-10-CM

## 2014-03-30 DIAGNOSIS — E669 Obesity, unspecified: Secondary | ICD-10-CM | POA: Insufficient documentation

## 2014-03-30 DIAGNOSIS — Z8709 Personal history of other diseases of the respiratory system: Secondary | ICD-10-CM | POA: Insufficient documentation

## 2014-03-30 DIAGNOSIS — Z975 Presence of (intrauterine) contraceptive device: Secondary | ICD-10-CM | POA: Insufficient documentation

## 2014-03-30 DIAGNOSIS — G8929 Other chronic pain: Secondary | ICD-10-CM | POA: Insufficient documentation

## 2014-03-30 MED ORDER — PENICILLIN V POTASSIUM 500 MG PO TABS: 500.0000 mg | ORAL_TABLET | Freq: Three times a day (TID) | ORAL | Status: DC

## 2014-03-30 MED ORDER — HYDROCODONE-ACETAMINOPHEN 5-325 MG PO TABS: 1.0000 | ORAL_TABLET | ORAL | Status: DC | PRN

## 2014-03-30 MED ORDER — HYDROCODONE-ACETAMINOPHEN 5-325 MG PO TABS
2.0000 | ORAL_TABLET | Freq: Once | ORAL | Status: AC
  Administered 2014-03-30: 2 via ORAL
  Filled 2014-03-30: qty 2

## 2014-03-30 NOTE — ED Notes (Signed)
The pt is c/o a toothache since last Saturday.  Swelling of the rt side of her face also

## 2014-03-30 NOTE — ED Provider Notes (Signed)
CSN: [id number]     Arrival date & time 03/30/14  0303 History   First MD Initiated Contact with Patient 03/30/14 [phone]     Chief Complaint  Patient presents with  . Dental Pain     (Consider location/radiation/quality/duration/timing/severity/associated sxs/prior Treatment) Patient is a 38 y.o. female presenting with tooth pain. The history is provided by the patient and medical records. No language interpreter was used.  Dental Pain Associated symptoms: facial swelling (subjective) and headaches ( right-sided)   Associated symptoms: no drooling, no fever and no neck pain     Nigel D Zendejas is a 38 y.o. female  With no major medical Hx presents to the Emergency Department complaining of gradual, persistent, progressively worsening right upper dental pain onset approx 24 hours ago. Associated symptoms include feeling of swelling in her right face, right otalgia.  Pt is taking Goody powder without relief.  Pt denies URI symptoms, including rhinorrhea, nasal congestion.  Nothing makes it better and eating, drinking makes it worse.  Pt denies fever, chills, nausea, vomiting, changes in vision.     Past Medical History  Diagnosis Date  . History of sinusitis   . Chronic headache   . Joint pain   . IUD (intrauterine device) in place     Mirena  . Uterine fibroid   . Obesity   . Insomnia   . Acne    Past Surgical History  Procedure Laterality Date  . Wisdom tooth extraction    . Breast reduction surgery  2009, 2000   Family History  Problem Relation Age of Onset  . Diabetes Mother     borderline  . Diabetes Father     borderline  . Depression Maternal Grandmother   . Hypertension Maternal Grandmother   . Diabetes Maternal Grandfather   . Hypertension Maternal Grandfather   . Stroke Maternal Aunt   . Cancer Neg Hx   . Heart disease Neg Hx    History  Substance Use Topics  . Smoking status: Never Smoker   . Smokeless tobacco: Never Used  . Alcohol Use: 0.6 oz/week    1  Glasses of wine per week   OB History   Grav Para Term Preterm Abortions TAB SAB Ect Mult Living                 Review of Systems  Constitutional: Negative for fever, chills and appetite change.  HENT: Positive for dental problem, ear pain and facial swelling (subjective). Negative for drooling, nosebleeds, postnasal drip, rhinorrhea and trouble swallowing.   Eyes: Negative for pain and redness.  Respiratory: Negative for cough and wheezing.   Cardiovascular: Negative for chest pain.  Gastrointestinal: Negative for nausea, vomiting and abdominal pain.  Musculoskeletal: Negative for neck pain and neck stiffness.  Skin: Negative for color change and rash.  Neurological: Positive for headaches ( right-sided). Negative for weakness and light-headedness.  All other systems reviewed and are negative.     Allergies  Belsomra; Propoxyphene and methadone; and Valium  Home Medications   Prior to Admission medications   Medication Sig Start Date End Date Taking? Authorizing Provider  aspirin-acetaminophen-caffeine (EXCEDRIN MIGRAINE) [phone] MG per tablet Take by mouth every 6 (six) hours as needed for headache.   Yes Historical Provider, MD  HYDROcodone-acetaminophen (NORCO/VICODIN) 5-325 MG per tablet Take 1-2 tablets by mouth every 4 (four) hours as needed for moderate pain or severe pain. 03/30/14   Aaleigha Bozza, PA-C  penicillin v potassium (VEETID) 500 MG tablet Take 1  tablet (500 mg total) by mouth 3 (three) times daily. 03/30/14   Joyice Magda, PA-C   BP 142/84  Pulse 88  Temp(Src) 98.2 F (36.8 C) (Oral)  SpO2 98% Physical Exam  Nursing note and vitals reviewed. Constitutional: She appears well-developed and well-nourished.  HENT:  Head: Normocephalic.  Right Ear: Tympanic membrane, external ear and ear canal normal.  Left Ear: Tympanic membrane, external ear and ear canal normal.  Nose: Nose normal. Right sinus exhibits no maxillary sinus tenderness and no  frontal sinus tenderness. Left sinus exhibits no maxillary sinus tenderness and no frontal sinus tenderness.  Mouth/Throat: Uvula is midline, oropharynx is clear and moist and mucous membranes are normal. No oral lesions. Abnormal dentition. Dental caries present. No uvula swelling or lacerations. No oropharyngeal exudate, posterior oropharyngeal edema, posterior oropharyngeal erythema or tonsillar abscesses.    Pain to Palpation of tooth #3 No gross abscess No erythema or induration of the gumline  Eyes: Conjunctivae are normal. Pupils are equal, round, and reactive to light. Right eye exhibits no discharge. Left eye exhibits no discharge.  Neck: Normal range of motion. Neck supple.  Cardiovascular: Normal rate, regular rhythm and normal heart sounds.   Pulmonary/Chest: Effort normal and breath sounds normal. No respiratory distress. She has no wheezes.  Abdominal: Soft. Bowel sounds are normal. She exhibits no distension. There is no tenderness.  Lymphadenopathy:    She has no cervical adenopathy.  Neurological: She is alert.  Skin: Skin is warm and dry.  Psychiatric: She has a normal mood and affect.    ED Course  Procedures (including critical care time) Labs Review Labs Reviewed - No data to display  Imaging Review No results found.   EKG Interpretation None      MDM   Final diagnoses:  Pain due to dental caries   Jeriyah D Landowski presents with dental pain.  Patient with toothache.  No gross abscess.  Exam unconcerning for Ludwig's angina or spread of infection.  Will treat with penicillin and pain medicine.  Urged patient to follow-up with dentist.     BP 142/84  Pulse 88  Temp(Src) 98.2 F (36.8 C) (Oral)  SpO2 98%   Abigail Butts, PA-C 03/30/14 [phone]

## 2014-03-30 NOTE — Discharge Instructions (Signed)

## 2014-03-30 NOTE — ED Provider Notes (Signed)
Medical screening examination/treatment/procedure(s) were performed by non-physician practitioner and as supervising physician I was immediately available for consultation/collaboration.   EKG Interpretation None       Kalman Drape, MD 03/30/14 [phone]

## 2014-07-21 ENCOUNTER — Ambulatory Visit (INDEPENDENT_AMBULATORY_CARE_PROVIDER_SITE_OTHER): Payer: BC Managed Care – PPO | Admitting: Family Medicine

## 2014-07-21 ENCOUNTER — Encounter: Payer: Self-pay | Admitting: Family Medicine

## 2014-07-21 VITALS — BP 134/82 | HR 80 | Temp 99.6°F | Ht 66.25 in | Wt 199.0 lb

## 2014-07-21 DIAGNOSIS — B349 Viral infection, unspecified: Secondary | ICD-10-CM

## 2014-07-21 DIAGNOSIS — J069 Acute upper respiratory infection, unspecified: Secondary | ICD-10-CM

## 2014-07-21 DIAGNOSIS — J029 Acute pharyngitis, unspecified: Secondary | ICD-10-CM

## 2014-07-21 LAB — POCT RAPID STREP A (OFFICE): Rapid Strep A Screen: NEGATIVE

## 2014-07-21 NOTE — Progress Notes (Signed)
Chief Complaint  Patient presents with  . Sore Throat    started Saturday. Hurts to swallow. Has body aches, chills. Unsure if she has had a temp.    2 days ago she started with painful swallowing, more on the left.  Yesterday she started with sweats, chills, sore throat was worse yesterday morning.  She has body aches.  She has pressure in both cheeks; she noticed some blood when she would blow her nose.  She has postnasal drainage, which just started this morning, along with some chest congestion. 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  She only has a slight cough, nonproductive.  Her 36 yo daughter had similar symptoms last week, and was diagnosed with a viral illness (had negative strep).  She also has sick contacts at work.  She took ibuprofen last night (800mg ), and some benadryl over the weekend.  PMH, PSH and SH reviewed.  Outpatient Encounter Prescriptions as of 07/21/2014  Medication Sig Note  . levonorgestrel (MIRENA) 20 MCG/24HR IUD 1 each by Intrauterine route once.   Marland Kitchen ibuprofen (ADVIL,MOTRIN) 200 MG tablet Take 800 mg by mouth every 6 (six) hours as needed. 07/21/2014: Last dose last night.  . [DISCONTINUED] aspirin-acetaminophen-caffeine (EXCEDRIN MIGRAINE) [phone] MG per tablet Take by mouth every 6 (six) hours as needed for headache.   . [DISCONTINUED] HYDROcodone-acetaminophen (NORCO/VICODIN) 5-325 MG per tablet Take 1-2 tablets by mouth every 4 (four) hours as needed for moderate pain or severe pain.   . [DISCONTINUED] penicillin v potassium (VEETID) 500 MG tablet Take 1 tablet (500 mg total) by mouth 3 (three) times daily.    Allergies  Allergen Reactions  . Belsomra [Suvorexant]     Didn't tolerate  . Propoxyphene Itching  . Valium [Diazepam] Nausea And Vomiting   ROS:  +headache, fever; no fainting spells.  Some dizziness when she stood up quickly.  Some chest congestion and pain only with a very deep breath.  No exertional  chest pain. Denies palpitations.  Denies nausea, vomiting, diarrhea, urinary complaints.  Denies bleeding/bruising, rashes. See HPI  PHYSICAL EXAM: BP 134/82 mmHg  Pulse 80  Temp(Src) 99.6 F (37.6 C) (Tympanic)  Ht 5' 6.25" (1.683 m)  Wt 199 lb (90.266 kg)  BMI 31.87 kg/m2  LMP 07/14/2014   Well developed, pleasant female, in no distress, in good spirits HEENT: PERRL, EOMI, conjunctiva clear. TM's and EAC's normal. Nasal mucosa is moderately edematous with white mucus bilaterally.  Tender over bilateral maxillary sinuses. OP is clear--no significant erythema, no ulcerations, no exudates Neck: she is tender posteriorly (bilaterally), and tender along the muscles on the left (SCM),  Mildly tender lymphadenopathy noted L>R Heart: regular rate and rhythm without murmur Lungs: clear bilaterally. No wheezes, rales, ronchi Skin: no rashes Neuro: alert and oriented. Cranial nerves intact  ASSESSMENT/PLAN:  Acute upper respiratory infection  Sore throat - Plan: Rapid Strep A  Viral syndrome  Drink plenty of fluids. Continue to use ibuprofen (up to 800mg  three times daily with food) along with tylenol, if needed for pain or fever. I recommend using a decongestant such as sudafed to help with sinus pain. I recommend using expectorant (guaifenesin--ie found in Mucinex or Robitussin). For your throat pain--you can try salt water gargles, chloraseptic spray  Call or return if symptoms persist or worsen (ie fevers continue or get higher) Expect symptoms to take another 3-5 days before they start to improve, possibly up to another 7-10 days. Call if you  have gotten better, but then acutely get worse (recurrent fever, discolored mucus, sinus pain)--you will need an antibiotic at that point. Return for evaluation if having shortness of breath, chest pain.

## 2014-07-21 NOTE — Patient Instructions (Signed)
  Drink plenty of fluids. Continue to use ibuprofen (up to 800mg  three times daily with food) along with tylenol, if needed for pain or fever. I recommend using a decongestant such as sudafed to help with sinus pain. I recommend using expectorant (guaifenesin--ie found in Mucinex or Robitussin). For your throat pain--you can try salt water gargles, chloraseptic spray  Call or return if symptoms persist or worsen (ie fevers continue or get higher) Expect symptoms to take another 3-5 days before they start to improve, possibly up to another 7-10 days. Call if you have gotten better, but then acutely get worse (recurrent fever, discolored mucus, sinus pain)--you will need an antibiotic at that point. Return for evaluation if having shortness of breath, chest pain.

## 2014-07-22 ENCOUNTER — Telehealth: Payer: Self-pay | Admitting: Family Medicine

## 2014-07-22 ENCOUNTER — Other Ambulatory Visit: Payer: Self-pay

## 2014-07-22 MED ORDER — PHENYLEPH-PROMETHAZINE-COD 5-6.25-10 MG/5ML PO SYRP: 5.0000 mL | ORAL_SOLUTION | Freq: Four times a day (QID) | ORAL | Status: DC

## 2014-07-22 NOTE — Telephone Encounter (Signed)
Pt called and stated that she is coughing really bad and would like to have a cough med called in for her. Pt uses [facility] [address] and can be reached at [phone]

## 2014-07-22 NOTE — Telephone Encounter (Signed)
Patient wanted the one we could call in so i have called this in for her

## 2014-07-22 NOTE — Telephone Encounter (Signed)
Need to know if needs cough meds for during day or at night.  We can using sedating ones at night, but I can't prescribe anything with hydrocodone because it requires a written prescription.  Would have to be a codeine containing syrup if she wants it called in tonight vs waiting to pick up from the office tomorrow.  If cough is bad during the day, and wants to be able to take meds during the day and not fall asleep (and be able to drive), then okay to prescribe benzonatate 200mg  every 8 hours prn cough #30, no refill.  If she wants it for bedtime, then okay to call in promethazine/codeine 6.25/10 per 39mL.  32mL every 6 hours prn cough (mainly to use at bedtime, as it will be sedating) #75 mL, no refill

## 2014-07-22 NOTE — Telephone Encounter (Signed)
Please document it in meds and orders as "phone in" (so it will be clear in future which med/quantity you called in). Thanks

## 2014-07-30 ENCOUNTER — Other Ambulatory Visit: Payer: BC Managed Care – PPO

## 2014-09-04 ENCOUNTER — Other Ambulatory Visit (INDEPENDENT_AMBULATORY_CARE_PROVIDER_SITE_OTHER): Payer: BLUE CROSS/BLUE SHIELD

## 2014-09-04 ENCOUNTER — Other Ambulatory Visit: Payer: Self-pay | Admitting: Medical

## 2014-09-04 DIAGNOSIS — Z23 Encounter for immunization: Secondary | ICD-10-CM

## 2014-09-04 NOTE — Telephone Encounter (Signed)
Audelia Acton, Ok to RF?

## 2014-09-10 ENCOUNTER — Other Ambulatory Visit: Payer: Self-pay

## 2014-09-18 ENCOUNTER — Telehealth: Payer: Self-pay | Admitting: Internal Medicine

## 2014-09-18 NOTE — Telephone Encounter (Signed)
Pt needs a refill on her clindamycin as she just ran out of refills. Please send to [address]. If you need to get in touch with patient and she doesn't answer you can leave her a message

## 2014-09-18 NOTE — Telephone Encounter (Signed)
Needs appt

## 2014-09-22 ENCOUNTER — Telehealth: Payer: Self-pay | Admitting: Medical

## 2014-09-22 NOTE — Telephone Encounter (Signed)
Left message for pt to call, needs an appt per shane

## 2014-10-07 ENCOUNTER — Ambulatory Visit: Payer: BLUE CROSS/BLUE SHIELD | Admitting: Medical

## 2014-11-25 ENCOUNTER — Ambulatory Visit (INDEPENDENT_AMBULATORY_CARE_PROVIDER_SITE_OTHER): Payer: BLUE CROSS/BLUE SHIELD | Admitting: Medical

## 2014-11-25 ENCOUNTER — Encounter: Payer: Self-pay | Admitting: Medical

## 2014-11-25 VITALS — BP 122/80 | HR 78 | Resp 15 | Wt 205.0 lb

## 2014-11-25 DIAGNOSIS — E669 Obesity, unspecified: Secondary | ICD-10-CM | POA: Diagnosis not present

## 2014-11-25 DIAGNOSIS — R030 Elevated blood-pressure reading, without diagnosis of hypertension: Secondary | ICD-10-CM | POA: Diagnosis not present

## 2014-11-25 DIAGNOSIS — L709 Acne, unspecified: Secondary | ICD-10-CM

## 2014-11-25 MED ORDER — PHENTERMINE-TOPIRAMATE ER 3.75-23 MG PO CP24: 1.0000 | ORAL_CAPSULE | ORAL | Status: DC

## 2014-11-25 MED ORDER — PHENTERMINE-TOPIRAMATE ER 7.5-46 MG PO CP24: 1.0000 | ORAL_CAPSULE | ORAL | Status: DC

## 2014-11-25 MED ORDER — BENZOYL PEROXIDE 5 % EX LIQD: Freq: Two times a day (BID) | CUTANEOUS | Status: DC

## 2014-11-25 NOTE — Patient Instructions (Signed)
  Thank you for giving me the opportunity to serve you today.    Your diagnosis today includes: Encounter Diagnoses  Name Primary?  . Obesity Yes  . Acne, unspecified acne type      Specific recommendations today include: Diet  Increase your water intake, get at least 64 ounces of water daily  Eat 3-4 fruits daily  Eat plenty of vegetables throughout the day, preferably each meal  Eat good sources of grains such as oatmeal, barley, whole grain pasta, whole grain bread, but limit the serving size to 1 cup of oatmeal or pasta per meal or 2 slices of bread per meal  We don't need to meat at each meal, however if you do eat meat, limit serving size to the size of your palm, and eat chicken fish or [place], lean cuts of meat  Eat beans every day as this is a good nutrient source and helps to curb appetite  Consider using a program such as Weight Watchers  Consider using a Smart phone app such as My Fitness [employer] or [employer] to track your calories and progress   Things to limit or avoid:  Avoid fast food, fried foods, fatty foods  Limit sweets, ice cream, cake and other baked goods  Avoid soda, beer, alcohol, sweet tea  Exercise  You need to be exercising most days of the week for 30-45 minutes or more  Good forms of exercise include walking, hiking, stationary bike or bicycling outside, lap swimming, aerobics class, dance, Zumba  Consider getting a trainer at a gym to help with exercise  Medication  Begin Qsymia weight loss medication  Start by taking the Qsymia 3.75/23 mg dose, once daily in the morning before breakfast for the first 2 weeks  Then increase to the Qsymia 7.5/46mg  dose, once daily in the morning before breakfast  You will need to use the coupon card to call and activate the medication  If your insurance does not cover the weight loss medicine listed above, check on the insurance coverage for the following  medications:  Belviq  Contrave  Consider weighing yourself daily to keep track of your weight   Recheck in 4-6 weeks

## 2014-11-25 NOTE — Progress Notes (Signed)
Subjective: Here for concerns of obesity, desire to lose weight and acne.   Has acne of cheeks.  In the past clindagel works so so.  Didn't see to get that great of improvement.    After last visit in 02/2014, ended up changing positions on her job.  Feels extra heavy currently, not able to lose weight despite exercise.   Feels tired even though exercise.   Currently goes to the [facility], walks the indoor track, runs the curves, sometimes does bike for 30 minutes.  Exercises 4 days per week.   Drinks a lot of water.   Feels stuck at her current weight.  Had tried belsomra last year without improvement.   Never got Qsymia since she was focusing on stress reduction, medication for sleep and acne and the Qsymia prescription ran out.     Diet - cooks a lot at home, lots of fish, chicken, spinach, eating out less than prior, less fried.      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  At last visit in 02/2014 had issues with headaches and insomnia, and since she has been working this job and reduced stress, headaches and sleep are much improved.    Past Medical History  Diagnosis Date  . History of sinusitis   . Chronic headache   . Joint pain   . IUD (intrauterine device) in place     Mirena  . Uterine fibroid   . Obesity   . Insomnia   . Acne    ROS as in subjective  Objective: BP 122/80 mmHg  Pulse 78  Resp 15  Wt 205 lb (92.987 kg)  General appearance: alert, no distress, WD/WN Skin moderate facial acne Oral cavity: MMM, no lesions Neck: supple, no lymphadenopathy, no thyromegaly, no masses Heart: RRR, normal S1, S2, no murmurs Lungs: CTA bilaterally, no wheezes, rhonchi, or rales Abdomen: +bs, soft, non tender, non distended, no masses, no hepatomegaly, no splenomegaly Pulses: 2+ symmetric, upper and lower extremities, normal cap refill Ext: no edema   Assessment: Encounter Diagnoses  Name Primary?  . Obesity Yes  . Acne, unspecified acne type   .  Elevated blood pressure reading without diagnosis of hypertension    Plan: Obesity, elevated BPs in the past - begin trial of Qsymia, discused risks/benefits   Discussed diet, exercise recommendations, using My Fitness Pal to track calories, water intake, f/u 4-6 wk.  Acne - advised BID facial washes, begin Benzoyl peroxide BID facial washes, f/u 4-6 wk.

## 2015-11-04 ENCOUNTER — Other Ambulatory Visit: Payer: Self-pay | Admitting: Medical

## 2015-11-05 NOTE — Telephone Encounter (Signed)
Is this ok to refill?  

## 2016-08-29 DIAGNOSIS — R519 Headache, unspecified: Secondary | ICD-10-CM

## 2016-08-29 DIAGNOSIS — G8929 Other chronic pain: Secondary | ICD-10-CM

## 2016-08-29 HISTORY — DX: Other chronic pain: G89.29

## 2016-08-29 HISTORY — DX: Headache, unspecified: R51.9

## 2017-05-04 DIAGNOSIS — H0011 Chalazion right upper eyelid: Secondary | ICD-10-CM | POA: Diagnosis not present

## 2017-08-11 DIAGNOSIS — M531 Cervicobrachial syndrome: Secondary | ICD-10-CM | POA: Diagnosis not present

## 2017-08-11 DIAGNOSIS — M5386 Other specified dorsopathies, lumbar region: Secondary | ICD-10-CM | POA: Diagnosis not present

## 2017-08-11 DIAGNOSIS — M9903 Segmental and somatic dysfunction of lumbar region: Secondary | ICD-10-CM | POA: Diagnosis not present

## 2017-08-11 DIAGNOSIS — M9904 Segmental and somatic dysfunction of sacral region: Secondary | ICD-10-CM | POA: Diagnosis not present

## 2017-09-04 DIAGNOSIS — M531 Cervicobrachial syndrome: Secondary | ICD-10-CM | POA: Diagnosis not present

## 2017-09-04 DIAGNOSIS — M9903 Segmental and somatic dysfunction of lumbar region: Secondary | ICD-10-CM | POA: Diagnosis not present

## 2017-09-04 DIAGNOSIS — M9904 Segmental and somatic dysfunction of sacral region: Secondary | ICD-10-CM | POA: Diagnosis not present

## 2017-09-04 DIAGNOSIS — M5386 Other specified dorsopathies, lumbar region: Secondary | ICD-10-CM | POA: Diagnosis not present

## 2017-09-06 DIAGNOSIS — M9903 Segmental and somatic dysfunction of lumbar region: Secondary | ICD-10-CM | POA: Diagnosis not present

## 2017-09-06 DIAGNOSIS — M531 Cervicobrachial syndrome: Secondary | ICD-10-CM | POA: Diagnosis not present

## 2017-09-06 DIAGNOSIS — M9904 Segmental and somatic dysfunction of sacral region: Secondary | ICD-10-CM | POA: Diagnosis not present

## 2017-09-06 DIAGNOSIS — M5386 Other specified dorsopathies, lumbar region: Secondary | ICD-10-CM | POA: Diagnosis not present

## 2017-09-11 DIAGNOSIS — M5386 Other specified dorsopathies, lumbar region: Secondary | ICD-10-CM | POA: Diagnosis not present

## 2017-09-11 DIAGNOSIS — M531 Cervicobrachial syndrome: Secondary | ICD-10-CM | POA: Diagnosis not present

## 2017-09-11 DIAGNOSIS — M9903 Segmental and somatic dysfunction of lumbar region: Secondary | ICD-10-CM | POA: Diagnosis not present

## 2017-09-11 DIAGNOSIS — M9904 Segmental and somatic dysfunction of sacral region: Secondary | ICD-10-CM | POA: Diagnosis not present

## 2017-10-16 ENCOUNTER — Ambulatory Visit: Payer: BLUE CROSS/BLUE SHIELD | Admitting: Medical

## 2017-10-28 DIAGNOSIS — F4321 Adjustment disorder with depressed mood: Secondary | ICD-10-CM | POA: Diagnosis not present

## 2017-10-28 DIAGNOSIS — F410 Panic disorder [episodic paroxysmal anxiety] without agoraphobia: Secondary | ICD-10-CM | POA: Diagnosis not present

## 2017-11-01 DIAGNOSIS — F4323 Adjustment disorder with mixed anxiety and depressed mood: Secondary | ICD-10-CM | POA: Diagnosis not present

## 2017-11-24 ENCOUNTER — Encounter: Payer: Self-pay | Admitting: Medical

## 2017-11-24 ENCOUNTER — Ambulatory Visit (INDEPENDENT_AMBULATORY_CARE_PROVIDER_SITE_OTHER): Payer: BLUE CROSS/BLUE SHIELD | Admitting: Medical

## 2017-11-24 VITALS — BP 124/84 | HR 92 | Ht 67.0 in | Wt 206.2 lb

## 2017-11-24 DIAGNOSIS — Z7189 Other specified counseling: Secondary | ICD-10-CM

## 2017-11-24 DIAGNOSIS — R1912 Hyperactive bowel sounds: Secondary | ICD-10-CM | POA: Diagnosis not present

## 2017-11-24 DIAGNOSIS — Z23 Encounter for immunization: Secondary | ICD-10-CM | POA: Insufficient documentation

## 2017-11-24 DIAGNOSIS — Z0000 Encounter for general adult medical examination without abnormal findings: Secondary | ICD-10-CM | POA: Diagnosis not present

## 2017-11-24 DIAGNOSIS — R5383 Other fatigue: Secondary | ICD-10-CM | POA: Insufficient documentation

## 2017-11-24 DIAGNOSIS — Z7185 Encounter for immunization safety counseling: Secondary | ICD-10-CM | POA: Insufficient documentation

## 2017-11-24 NOTE — Progress Notes (Signed)
Subjective:   HPI  Alisha Coffey is a 42 y.o. female who presents for Chief Complaint  Patient presents with  . Annual Exam    stomach noises bothering her    Medical care team includes: Ted Goodner, Camelia Eng, PA-C here for primary care Dentist Eye doctor Gynecology - [facility] OB/Gyn Dr. Chucky May, psychiatry  Concerns: Was having chronic headaches - saw Dr. Toy Care psychiatry, was prescribed medication to help with stress and headaches.  Was also using OTC magnesium as well.  Doing fine.  Job is stressful  Stomach makes a lot of gurgles, no problems with bowels, blood in stool, diarrhea, nausea.     Using OTC medication for allergies.    Sees gyn.  Has IUD, periods lately heavy and bad menstrual cramps.  Reviewed their medical, surgical, family, social, medication, and allergy history and updated chart as appropriate.  Past Medical History:  Diagnosis Date  . Acne   . Anxiety   . Chronic headache 2018   improved after treatment for anxiety 2018  . History of sinusitis   . Insomnia   . IUD (intrauterine device) in place    Mirena  . Joint pain   . Obesity   . Uterine fibroid     Past Surgical History:  Procedure Laterality Date  . BREAST REDUCTION SURGERY  2009, 2000  . WISDOM TOOTH EXTRACTION      Social History   Socioeconomic History  . Marital status: Single    Spouse name: Not on file  . Number of children: Not on file  . Years of education: Not on file  . Highest education level: Not on file  Occupational History  . Occupation: Medical sales representative: [employer]    Comment: [employer]  Social Needs  . Financial resource strain: Not on file  . Food insecurity:    Worry: Not on file    Inability: Not on file  . Transportation needs:    Medical: Not on file    Non-medical: Not on file  Tobacco Use  . Smoking status: Never Smoker  . Smokeless tobacco: Never Used  Substance and Sexual Activity  . Alcohol use: Yes    Alcohol/week: 0.6 oz     Types: 1 Glasses of wine per week    Comment: occasional  . Drug use: No  . Sexual activity: Yes    Birth control/protection: IUD  Lifestyle  . Physical activity:    Days per week: Not on file    Minutes per session: Not on file  . Stress: Not on file  Relationships  . Social connections:    Talks on phone: Not on file    Gets together: Not on file    Attends religious service: Not on file    Active member of club or organization: Not on file    Attends meetings of clubs or organizations: Not on file    Relationship status: Not on file  . Intimate partner violence:    Fear of current or ex partner: Not on file    Emotionally abused: Not on file    Physically abused: Not on file    Forced sexual activity: Not on file  Other Topics Concern  . Not on file  Social History Narrative   Single.  2 daughters, Tajah age 75, Hollis age 14yo.  Working at [employer], Therapist, sports.  Exercise  - not much.   10/2017.    Family History  Problem Relation Age of Onset  . Hypertension Mother   . Depression Maternal Grandmother   . Hypertension Maternal Grandmother   . Diabetes Maternal Grandfather   . Hypertension Maternal Grandfather   . Stroke Maternal Aunt   . Cancer Neg Hx   . Heart disease Neg Hx      Current Outpatient Medications:  .  Aspirin-Acetaminophen-Caffeine (TGT MIGRAINE RELIEF PO), Take by mouth., Disp: , Rfl:  .  Biotin 10 MG CAPS, Take by mouth., Disp: , Rfl:  .  FLUoxetine (PROZAC) 20 MG capsule, Take by mouth daily., Disp: , Rfl: 12 .  traZODone (DESYREL) 50 MG tablet, Take 50 mg by mouth at bedtime., Disp: , Rfl: 12 .  ALPRAZolam (XANAX) 1 MG tablet, Take 1 mg by mouth at bedtime as needed for anxiety., Disp: , Rfl:  .  benzoyl peroxide 5 % external liquid, Apply topically 2 (two) times daily. (Patient not taking: Reported on 11/24/2017), Disp: 142 g, Rfl: 5 .  Phentermine-Topiramate (QSYMIA) 3.75-23 MG CP24, Take 1 capsule by mouth every  morning. (Patient not taking: Reported on 11/24/2017), Disp: 14 capsule, Rfl: 0 .  Phentermine-Topiramate (QSYMIA) 7.5-46 MG CP24, Take 1 capsule by mouth every morning. (Patient not taking: Reported on 11/24/2017), Disp: 30 capsule, Rfl: 1  Allergies  Allergen Reactions  . Belsomra [Suvorexant]     Didn't tolerate  . Propoxyphene Itching  . Valium [Diazepam] Nausea And Vomiting     Review of Systems Constitutional: -fever, -chills, -sweats, -unexpected weight change, -decreased appetite, -fatigue Allergy: -sneezing, -itching, -congestion Dermatology: -changing moles, --rash, -lumps ENT: -runny nose, -ear pain, -sore throat, -hoarseness, -sinus pain, -teeth pain, - ringing in ears, -hearing loss, -nosebleeds Cardiology: -chest pain, -palpitations, -swelling, -difficulty breathing when lying flat, -waking up short of breath Respiratory: -cough, -shortness of breath, -difficulty breathing with exercise or exertion, -wheezing, -coughing up blood Gastroenterology: -abdominal pain, -nausea, -vomiting, -diarrhea, -constipation, -blood in stool, -changes in bowel movement, -difficulty swallowing or eating Hematology: -bleeding, -bruising  Musculoskeletal: -joint aches, -muscle aches, -joint swelling, -back pain, -neck pain, -cramping, -changes in gait Ophthalmology: denies vision changes, eye redness, itching, discharge Urology: -burning with urination, -difficulty urinating, -blood in urine, -urinary frequency, -urgency, -incontinence Neurology: -headache, -weakness, -tingling, -numbness, -memory loss, -falls, -dizziness Psychology: -depressed mood, -agitation, +sleep problems Breast/gyn: -breast tenderness, -discharge, -lumps, -vaginal discharge,- irregular periods, +heavy periods     Objective:  BP 124/84 (BP Location: Right Arm, Patient Position: Sitting, Cuff Size: Normal)   Pulse 92   Ht 5\' 7"  (1.702 m)   Wt 206 lb 3.2 oz (93.5 kg)   LMP 11/06/2017 (Approximate)   SpO2 98%   BMI  32.30 kg/m   General appearance: alert, no distress, WD/WN, African American female Skin: star tattoos left posterior ear lobe, butterfly tattoo upper back, lower posterior neck, no worrisome findings. HEENT: normocephalic, conjunctiva/corneas normal, sclerae anicteric, PERRLA, EOMi, nares patent, no discharge or erythema, pharynx normal Oral cavity: MMM, tongue normal, teeth in good repair, braces Neck: supple, no lymphadenopathy, no thyromegaly, no masses, normal ROM, no bruits Chest: non tender, normal shape and expansion Heart: RRR, normal S1, S2, no murmurs Lungs: CTA bilaterally, no wheezes, rhonchi, or rales Abdomen: +increased bs, soft, non tender, non distended, no masses, no hepatomegaly, no splenomegaly, no bruits Back: non tender, normal ROM, no scoliosis Musculoskeletal: upper extremities non tender, no obvious deformity, normal ROM throughout, lower extremities non tender, no obvious deformity, normal ROM throughout Extremities: no edema, no cyanosis, no clubbing Pulses: 2+ symmetric, upper  and lower extremities, normal cap refill Neurological: alert, oriented x 3, CN2-12 intact, strength normal upper extremities and lower extremities, sensation normal throughout, DTRs 2+ throughout, no cerebellar signs, gait normal Psychiatric: normal affect, behavior normal, pleasant  Breast/gyn/rectal - deferred to gynecology  Assessment and Plan :   Encounter Diagnoses  Name Primary?  . Encounter for health maintenance examination in adult Yes  . Need for Tdap vaccination   . Fatigue, unspecified type   . Vaccine counseling   . Increased bowel sounds     Physical exam - discussed and counseled on healthy lifestyle, diet, exercise, preventative care, vaccinations, sick and well care, proper use of emergency dept and after hours care, and addressed their concerns.    Health screening: See your eye doctor yearly for routine vision care. See your dentist yearly for routine dental care  including hygiene visits twice yearly.  Discussed STD testing, declines, no current concerns  Cancer screening Discussed and advised monthly self breast exams Discussed mammogram, advised mammogram age 56 Discussed pap smear recommendations.   Pap smear per gyn  Colonoscopy:  Age 53  Vaccinations: Advised yearly influenza vaccine Counseled on the Tdap (tetanus, diptheria, and acellular pertussis) vaccine.  Vaccine information sheet given. Tdap vaccine given after consent obtained.   Acute issues discussed: Increase bowel sounds - advised food diary, avoid trigger foods, can use antacids and gas x prn  Separate significant chronic issues discussed: Obesity - counseled on diet, exercise, goals  Ellia was seen today for annual exam.  Diagnoses and all orders for this visit:  Encounter for health maintenance examination in adult -     Comprehensive metabolic panel -     CBC with Differential/Platelet -     Lipid panel -     Hemoglobin A1c -     TSH -     VITAMIN D 25 Hydroxy (Vit-D Deficiency, Fractures)  Need for Tdap vaccination  Fatigue, unspecified type  Vaccine counseling  Increased bowel sounds   Follow-up pending labs, yearly for physical

## 2017-11-24 NOTE — Addendum Note (Signed)
Addended by: Caprice Beaver T on: 11/24/2017 09:26 AM   Modules accepted: Orders

## 2017-11-25 LAB — TSH: TSH: 1.21 u[IU]/mL (ref 0.450–4.500)

## 2017-11-25 LAB — CBC WITH DIFFERENTIAL/PLATELET
Basophils Absolute: 0 10*3/uL (ref 0.0–0.2)
Basos: 1 %
EOS (ABSOLUTE): 0 10*3/uL (ref 0.0–0.4)
Eos: 1 %
Hematocrit: 36.6 % (ref 34.0–46.6)
Hemoglobin: 12.6 g/dL (ref 11.1–15.9)
Immature Grans (Abs): 0 10*3/uL (ref 0.0–0.1)
Immature Granulocytes: 0 %
Lymphocytes Absolute: 1.3 10*3/uL (ref 0.7–3.1)
Lymphs: 43 %
MCH: 30.4 pg (ref 26.6–33.0)
MCHC: 34.4 g/dL (ref 31.5–35.7)
MCV: 88 fL (ref 79–97)
Monocytes Absolute: 0.2 10*3/uL (ref 0.1–0.9)
Monocytes: 5 %
Neutrophils Absolute: 1.5 10*3/uL (ref 1.4–7.0)
Neutrophils: 50 %
Platelets: 237 10*3/uL (ref 150–379)
RBC: 4.15 x10E6/uL (ref 3.77–5.28)
RDW: 13.8 % (ref 12.3–15.4)
WBC: 3 10*3/uL — ABNORMAL LOW (ref 3.4–10.8)

## 2017-11-25 LAB — COMPREHENSIVE METABOLIC PANEL
ALT: 16 IU/L (ref 0–32)
AST: 18 IU/L (ref 0–40)
Albumin/Globulin Ratio: 1.4 (ref 1.2–2.2)
Albumin: 4.2 g/dL (ref 3.5–5.5)
Alkaline Phosphatase: 66 IU/L (ref 39–117)
BUN/Creatinine Ratio: 13 (ref 9–23)
BUN: 10 mg/dL (ref 6–20)
Bilirubin Total: 0.3 mg/dL (ref 0.0–1.2)
CO2: 21 mmol/L (ref 20–29)
Calcium: 9.1 mg/dL (ref 8.7–10.2)
Chloride: 105 mmol/L (ref 96–106)
Creatinine, Ser: 0.79 mg/dL (ref 0.57–1.00)
GFR calc Af Amer: 109 mL/min/{1.73_m2} (ref >59)
GFR calc non Af Amer: 95 mL/min/{1.73_m2} (ref >59)
Globulin, Total: 3 g/dL (ref 1.5–4.5)
Glucose: 89 mg/dL (ref 65–99)
Potassium: 4.2 mmol/L (ref 3.5–5.2)
Sodium: 139 mmol/L (ref 134–144)
Total Protein: 7.2 g/dL (ref 6.0–8.5)

## 2017-11-25 LAB — LIPID PANEL
Chol/HDL Ratio: 4.5 ratio — ABNORMAL HIGH (ref 0.0–4.4)
Cholesterol, Total: 223 mg/dL — ABNORMAL HIGH (ref 100–199)
HDL: 50 mg/dL (ref >39)
LDL Calculated: 159 mg/dL — ABNORMAL HIGH (ref 0–99)
Triglycerides: 70 mg/dL (ref 0–149)
VLDL Cholesterol Cal: 14 mg/dL (ref 5–40)

## 2017-11-25 LAB — HEMOGLOBIN A1C
Est. average glucose Bld gHb Est-mCnc: 103 mg/dL
Hgb A1c MFr Bld: 5.2 % (ref 4.8–5.6)

## 2017-11-25 LAB — VITAMIN D 25 HYDROXY (VIT D DEFICIENCY, FRACTURES): Vit D, 25-Hydroxy: 7 ng/mL — ABNORMAL LOW (ref 30.0–100.0)

## 2017-11-27 ENCOUNTER — Telehealth: Payer: Self-pay

## 2017-11-27 ENCOUNTER — Other Ambulatory Visit: Payer: Self-pay | Admitting: Medical

## 2017-11-27 MED ORDER — VITAMIN D (ERGOCALCIFEROL) 1.25 MG (50000 UNIT) PO CAPS: 50000.0000 [IU] | ORAL_CAPSULE | ORAL | 0 refills | Status: DC

## 2017-11-27 NOTE — Telephone Encounter (Signed)
Called patient and gave lab results. Patient had no questions or concerns.  

## 2017-12-09 DIAGNOSIS — F410 Panic disorder [episodic paroxysmal anxiety] without agoraphobia: Secondary | ICD-10-CM | POA: Diagnosis not present

## 2017-12-19 DIAGNOSIS — Z113 Encounter for screening for infections with a predominantly sexual mode of transmission: Secondary | ICD-10-CM | POA: Diagnosis not present

## 2017-12-19 DIAGNOSIS — Z6833 Body mass index (BMI) 33.0-33.9, adult: Secondary | ICD-10-CM | POA: Diagnosis not present

## 2017-12-19 DIAGNOSIS — N760 Acute vaginitis: Secondary | ICD-10-CM | POA: Diagnosis not present

## 2017-12-19 DIAGNOSIS — Z01419 Encounter for gynecological examination (general) (routine) without abnormal findings: Secondary | ICD-10-CM | POA: Diagnosis not present

## 2017-12-19 DIAGNOSIS — Z124 Encounter for screening for malignant neoplasm of cervix: Secondary | ICD-10-CM | POA: Diagnosis not present

## 2018-01-11 DIAGNOSIS — Z6833 Body mass index (BMI) 33.0-33.9, adult: Secondary | ICD-10-CM | POA: Diagnosis not present

## 2018-01-11 DIAGNOSIS — Z30431 Encounter for routine checking of intrauterine contraceptive device: Secondary | ICD-10-CM | POA: Diagnosis not present

## 2018-02-05 ENCOUNTER — Other Ambulatory Visit: Payer: Self-pay | Admitting: Obstetrics

## 2018-02-05 ENCOUNTER — Ambulatory Visit
Admission: RE | Admit: 2018-02-05 | Discharge: 2018-02-05 | Disposition: A | Discharge status: Home / Self Care | Payer: Self-pay | Source: Ambulatory Visit | Attending: Obstetrics | Admitting: Obstetrics

## 2018-02-05 DIAGNOSIS — Z975 Presence of (intrauterine) contraceptive device: Secondary | ICD-10-CM

## 2018-02-16 ENCOUNTER — Other Ambulatory Visit: Payer: Self-pay | Admitting: Medical

## 2018-02-21 ENCOUNTER — Other Ambulatory Visit: Payer: Self-pay | Admitting: Medical

## 2018-02-21 DIAGNOSIS — F332 Major depressive disorder, recurrent severe without psychotic features: Secondary | ICD-10-CM | POA: Diagnosis not present

## 2018-02-21 DIAGNOSIS — F410 Panic disorder [episodic paroxysmal anxiety] without agoraphobia: Secondary | ICD-10-CM | POA: Diagnosis not present

## 2018-02-21 NOTE — Telephone Encounter (Signed)
Is this ok to refill?  

## 2018-02-22 DIAGNOSIS — Z3202 Encounter for pregnancy test, result negative: Secondary | ICD-10-CM | POA: Diagnosis not present

## 2018-02-22 DIAGNOSIS — Z6833 Body mass index (BMI) 33.0-33.9, adult: Secondary | ICD-10-CM | POA: Diagnosis not present

## 2018-02-22 DIAGNOSIS — Z30430 Encounter for insertion of intrauterine contraceptive device: Secondary | ICD-10-CM | POA: Diagnosis not present

## 2018-03-24 DIAGNOSIS — F3181 Bipolar II disorder: Secondary | ICD-10-CM | POA: Diagnosis not present

## 2018-03-24 DIAGNOSIS — F410 Panic disorder [episodic paroxysmal anxiety] without agoraphobia: Secondary | ICD-10-CM | POA: Diagnosis not present

## 2018-04-20 ENCOUNTER — Ambulatory Visit: Payer: BLUE CROSS/BLUE SHIELD | Admitting: Medical

## 2018-04-20 VITALS — BP 140/98 | HR 98 | Temp 99.4°F | Resp 16 | Ht 66.5 in | Wt 208.0 lb

## 2018-04-20 DIAGNOSIS — R5383 Other fatigue: Secondary | ICD-10-CM | POA: Diagnosis not present

## 2018-04-20 DIAGNOSIS — M791 Myalgia, unspecified site: Secondary | ICD-10-CM | POA: Diagnosis not present

## 2018-04-20 DIAGNOSIS — R0602 Shortness of breath: Secondary | ICD-10-CM

## 2018-04-20 DIAGNOSIS — M2550 Pain in unspecified joint: Secondary | ICD-10-CM | POA: Diagnosis not present

## 2018-04-20 DIAGNOSIS — R9431 Abnormal electrocardiogram [ECG] [EKG]: Secondary | ICD-10-CM

## 2018-04-20 NOTE — Progress Notes (Signed)
Subjective: Chief Complaint  Patient presents with  . chest pain    chest pain, joint pain, tired, X 2 weeks   Here for concerns.   Doesn't feel well.  She notes joints hurt everywhere in bending areas.  Gets sharp pains in ankles, knees hurt, hurts to get out of a chair, arm hurts in elbows.  She notes having some chest pains, sharp at times.  Feels breaths are short at times.  Skin feels itchy under the skin.  Was on prozac but takes something else now, thinks its Depakote 500mg  for mood. This was just changed in last month by Dr. Toy Care, psychiatry.  No other aggravating or relieving factors. No other complaint.   Past Medical History:  Diagnosis Date  . Acne   . Anxiety   . Chronic headache 2018   improved after treatment for anxiety 2018  . History of sinusitis   . Insomnia   . IUD (intrauterine device) in place    Mirena  . Joint pain   . Obesity   . Uterine fibroid    Current Outpatient Medications on File Prior to Visit  Medication Sig Dispense Refill  . ALPRAZolam (XANAX) 1 MG tablet Take 1 mg by mouth at bedtime as needed for anxiety.    . divalproex (DEPAKOTE ER) 500 MG 24 hr tablet Take 500 mg by mouth at bedtime.    . traZODone (DESYREL) 50 MG tablet Take 50 mg by mouth at bedtime.  12  . Aspirin-Acetaminophen-Caffeine (TGT MIGRAINE RELIEF PO) Take by mouth.    . benzoyl peroxide 5 % external liquid Apply topically 2 (two) times daily. (Patient not taking: Reported on 11/24/2017) 142 g 5  . Biotin 10 MG CAPS Take by mouth.    . Vitamin D, Ergocalciferol, (DRISDOL) 50000 units CAPS capsule TAKE 1 CAPSULE (50,000 UNITS TOTAL) BY MOUTH EVERY 7 (SEVEN) DAYS. (Patient not taking: Reported on 04/20/2018) 12 capsule 0   No current facility-administered medications on file prior to visit.    Family History  Problem Relation Age of Onset  . Hypertension Mother   . Depression Maternal Grandmother   . Hypertension Maternal Grandmother   . Diabetes Maternal Grandfather   .  Hypertension Maternal Grandfather   . Stroke Maternal Aunt   . Cancer Neg Hx   . Heart disease Neg Hx      ROS as in subjective   Objective: BP (!) 140/98   Pulse 98   Temp 99.4 F (37.4 C) (Oral)   Resp 16   Ht 5' 6.5" (1.689 m)   Wt 208 lb (94.3 kg)   SpO2 97%   BMI 33.07 kg/m   BP Readings from Last 3 Encounters:  04/20/18 (!) 140/98  11/24/17 124/84  11/25/14 122/80   Wt Readings from Last 3 Encounters:  04/20/18 208 lb (94.3 kg)  11/24/17 206 lb 3.2 oz (93.5 kg)  11/25/14 205 lb (93 kg)     General appearence: alert, no distress, WD/WN,  HEENT: normocephalic, sclerae anicteric, PERRLA, EOMi, nares patent, no discharge or erythema, pharynx normal Oral cavity: MMM, no lesions Neck: supple, no lymphadenopathy, no thyromegaly, no masses Heart: RRR, normal S1, S2, no murmurs Lungs: CTA bilaterally, no wheezes, rhonchi, or rales Abdomen: +bs, soft, non tender, non distended, no masses, no hepatomegaly, no splenomegaly Back: non tender Musculoskeletal: nontender, no swelling, no obvious deformity Extremities: no edema, no cyanosis, no clubbing Pulses: 2+ symmetric, upper and lower extremities, normal cap refill Neurological: alert, oriented x 3, CN2-12  intact, strength normal upper extremities and lower extremities, sensation normal throughout, DTRs 2+ throughout, no cerebellar signs, gait normal Psychiatric: normal affect, behavior normal, pleasant    Adult ECG Report  Indication: SOB  Rate: 79 bpm  Rhythm: normal sinus rhythm  QRS Axis: 7 degrees  PR Interval: 144 ms  QRS Duration: 72ms  QTc: 435ms  Conduction Disturbances: T wave abnormality  Other Abnormalities: none  Patient's cardiac risk factors are: none.  EKG comparison: no prior to compare  Narrative Interpretation: abnormal EKG, possible ischemia    Assessment: Encounter Diagnoses  Name Primary?  . SOB (shortness of breath) Yes  . Other fatigue   . Abnormal EKG   . Polyarthralgia   .  Myalgia      Plan: Discussed her symptoms, possible differnetial for polyarthralgia and differential for SOB  Labs and EKG today  Discussed abnormal EKG findings and likely referral to cardiology  Alisha Coffey was seen today for chest pain.  Diagnoses and all orders for this visit:  SOB (shortness of breath) -     TSH -     CBC -     Sedimentation rate -     EKG 12-Lead -     Basic metabolic panel -     CK  Other fatigue -     TSH -     CBC -     Sedimentation rate -     EKG 12-Lead -     Basic metabolic panel -     CK  Abnormal EKG -     TSH -     CBC -     Sedimentation rate -     EKG 12-Lead -     Basic metabolic panel -     CK  Polyarthralgia -     TSH -     CBC -     Sedimentation rate -     EKG 12-Lead -     Basic metabolic panel -     CK  Myalgia -     TSH -     CBC -     Sedimentation rate -     EKG 12-Lead -     Basic metabolic panel -     CK

## 2018-04-21 LAB — BASIC METABOLIC PANEL
BUN/Creatinine Ratio: 15 (ref 9–23)
BUN: 13 mg/dL (ref 6–24)
CO2: 23 mmol/L (ref 20–29)
Calcium: 9.2 mg/dL (ref 8.7–10.2)
Chloride: 102 mmol/L (ref 96–106)
Creatinine, Ser: 0.84 mg/dL (ref 0.57–1.00)
GFR calc Af Amer: 101 mL/min/{1.73_m2} (ref >59)
GFR calc non Af Amer: 87 mL/min/{1.73_m2} (ref >59)
Glucose: 77 mg/dL (ref 65–99)
Potassium: 4.2 mmol/L (ref 3.5–5.2)
Sodium: 139 mmol/L (ref 134–144)

## 2018-04-21 LAB — CBC
Hematocrit: 36.6 % (ref 34.0–46.6)
Hemoglobin: 11.6 g/dL (ref 11.1–15.9)
MCH: 27.2 pg (ref 26.6–33.0)
MCHC: 31.7 g/dL (ref 31.5–35.7)
MCV: 86 fL (ref 79–97)
Platelets: 259 10*3/uL (ref 150–450)
RBC: 4.26 x10E6/uL (ref 3.77–5.28)
RDW: 14.9 % (ref 12.3–15.4)
WBC: 4.2 10*3/uL (ref 3.4–10.8)

## 2018-04-21 LAB — TSH: TSH: 1.55 u[IU]/mL (ref 0.450–4.500)

## 2018-04-21 LAB — CK: Total CK: 152 U/L (ref 24–173)

## 2018-04-21 LAB — SEDIMENTATION RATE: Sed Rate: 15 mm/hr (ref 0–32)

## 2018-04-23 ENCOUNTER — Other Ambulatory Visit: Payer: Self-pay

## 2018-04-23 DIAGNOSIS — R9431 Abnormal electrocardiogram [ECG] [EKG]: Secondary | ICD-10-CM

## 2018-04-26 DIAGNOSIS — F3132 Bipolar disorder, current episode depressed, moderate: Secondary | ICD-10-CM | POA: Diagnosis not present

## 2018-04-26 DIAGNOSIS — F3111 Bipolar disorder, current episode manic without psychotic features, mild: Secondary | ICD-10-CM | POA: Diagnosis not present

## 2018-04-27 DIAGNOSIS — F419 Anxiety disorder, unspecified: Secondary | ICD-10-CM | POA: Diagnosis not present

## 2018-04-27 DIAGNOSIS — R0789 Other chest pain: Secondary | ICD-10-CM | POA: Diagnosis not present

## 2018-04-27 DIAGNOSIS — R9431 Abnormal electrocardiogram [ECG] [EKG]: Secondary | ICD-10-CM | POA: Diagnosis not present

## 2018-04-27 DIAGNOSIS — I10 Essential (primary) hypertension: Secondary | ICD-10-CM | POA: Diagnosis not present

## 2018-04-28 DIAGNOSIS — F330 Major depressive disorder, recurrent, mild: Secondary | ICD-10-CM | POA: Diagnosis not present

## 2018-04-28 DIAGNOSIS — F411 Generalized anxiety disorder: Secondary | ICD-10-CM | POA: Diagnosis not present

## 2018-05-25 DIAGNOSIS — F23 Brief psychotic disorder: Secondary | ICD-10-CM | POA: Diagnosis not present

## 2018-05-25 DIAGNOSIS — F410 Panic disorder [episodic paroxysmal anxiety] without agoraphobia: Secondary | ICD-10-CM | POA: Diagnosis not present

## 2018-06-06 DIAGNOSIS — F23 Brief psychotic disorder: Secondary | ICD-10-CM | POA: Diagnosis not present

## 2018-06-06 DIAGNOSIS — F3176 Bipolar disorder, in full remission, most recent episode depressed: Secondary | ICD-10-CM | POA: Diagnosis not present

## 2018-06-06 DIAGNOSIS — F3174 Bipolar disorder, in full remission, most recent episode manic: Secondary | ICD-10-CM | POA: Diagnosis not present

## 2018-10-20 DIAGNOSIS — J029 Acute pharyngitis, unspecified: Secondary | ICD-10-CM | POA: Diagnosis not present

## 2018-10-20 DIAGNOSIS — R509 Fever, unspecified: Secondary | ICD-10-CM | POA: Diagnosis not present

## 2018-10-20 DIAGNOSIS — R05 Cough: Secondary | ICD-10-CM | POA: Diagnosis not present

## 2018-10-20 DIAGNOSIS — R6889 Other general symptoms and signs: Secondary | ICD-10-CM | POA: Diagnosis not present

## 2018-10-23 ENCOUNTER — Ambulatory Visit
Admission: RE | Admit: 2018-10-23 | Discharge: 2018-10-23 | Disposition: A | Discharge status: Home / Self Care | Payer: Self-pay | Source: Ambulatory Visit | Attending: Family Medicine | Admitting: Family Medicine

## 2018-10-23 ENCOUNTER — Ambulatory Visit (INDEPENDENT_AMBULATORY_CARE_PROVIDER_SITE_OTHER): Payer: BLUE CROSS/BLUE SHIELD | Admitting: Family Medicine

## 2018-10-23 ENCOUNTER — Encounter: Payer: Self-pay | Admitting: Family Medicine

## 2018-10-23 VITALS — BP 130/80 | HR 71 | Temp 98.5°F | Resp 16 | Wt 197.0 lb

## 2018-10-23 DIAGNOSIS — R0602 Shortness of breath: Secondary | ICD-10-CM

## 2018-10-23 DIAGNOSIS — R0989 Other specified symptoms and signs involving the circulatory and respiratory systems: Secondary | ICD-10-CM

## 2018-10-23 DIAGNOSIS — R6889 Other general symptoms and signs: Secondary | ICD-10-CM

## 2018-10-23 DIAGNOSIS — R058 Other specified cough: Secondary | ICD-10-CM

## 2018-10-23 DIAGNOSIS — R05 Cough: Secondary | ICD-10-CM

## 2018-10-23 NOTE — Progress Notes (Signed)
Chief Complaint  Patient presents with  . possible flu    flu dx on saturday at ER, given tamiflu, cough med, but having SOB, and coughing up brown stuff,  has a rattling noise in chest.     Subjective:  Alisha Coffey is a 43 y.o. female who states she was diagnosed with influenza 4 days ago at an UC presents today with complaints of chest congestion, occasional productive cough, shortness of breath.  States she is concerned that she might have pneumonia.  She has not taken anything today for fever, pain.   No longer having body aches, chills, headache, dizziness, abdominal pain, N/V/D.   Flu swab was negative but she has been taking Tamiflu for the past 4 days.   Denies sick contacts.  No other aggravating or relieving factors.  No other c/o.  ROS as in subjective.   Objective: Vitals:   10/23/18 1107  BP: 130/80  Pulse: 71  Resp: 16  Temp: 98.5 F (36.9 C)  SpO2: 98%    General appearance: Alert, WD/WN, no distress, mildly ill appearing                             Skin: warm, no rash                           Head: no sinus tenderness                            Eyes: conjunctiva normal, corneas clear, PERRLA                            Ears: pearly TMs, external ear canals normal                          Nose: septum midline, turbinates swollen, with erythema and clear discharge             Mouth/throat: MMM, tongue normal, mild pharyngeal erythema                           Neck: supple, no adenopathy, no thyromegaly, nontender                          Heart: RRR, normal S1, S2, no murmurs                         Lungs: CTA bilaterally, no wheezes, rales, or rhonchi      Assessment: Productive cough - Plan: DG Chest 2 View  Flu-like symptoms  Shortness of breath - Plan: DG Chest 2 View  Chest congestion - Plan: DG Chest 2 View    Plan: Discussed diagnosis and treatment of mildly intermittent productive cough. Chest XR ordered. Based on exam, I have a low  suspicion for pneumonia. Will hold off on antibiotic.   Suggested symptomatic OTC remedies including Mucinex. Nasal saline spray for congestion.  Tylenol or Ibuprofen OTC for fever and malaise.  Call/return if worsening pending chest XR.

## 2018-10-23 NOTE — Patient Instructions (Signed)
Go to [facility] imaging for your chest x-ray.  Complete the Tamiflu as discussed.  Make sure you are drinking plenty of water.  Try the plain Mucinex today and tonight.  We will call you with your x-ray results.

## 2018-11-21 ENCOUNTER — Telehealth: Payer: Self-pay | Admitting: Family Medicine

## 2018-11-21 NOTE — Telephone Encounter (Signed)
Set up virtual visit Thursday morning.   Need to get clarification on what she is requesting or concerned about?  I can't tell by the message.

## 2018-11-21 NOTE — Telephone Encounter (Signed)
Patient called and said her job is wanting to know if she is HIGH RISK with her history, heart, high bp.  Please let her know.   She is currently experiencing allergies, no fever, just got over flu.

## 2018-11-22 ENCOUNTER — Other Ambulatory Visit: Payer: Self-pay

## 2018-11-22 ENCOUNTER — Ambulatory Visit (INDEPENDENT_AMBULATORY_CARE_PROVIDER_SITE_OTHER): Payer: BLUE CROSS/BLUE SHIELD | Admitting: Medical

## 2018-11-22 VITALS — Temp 99.0°F | Ht 66.0 in | Wt 194.0 lb

## 2018-11-22 DIAGNOSIS — J4520 Mild intermittent asthma, uncomplicated: Secondary | ICD-10-CM | POA: Insufficient documentation

## 2018-11-22 DIAGNOSIS — R5383 Other fatigue: Secondary | ICD-10-CM

## 2018-11-22 DIAGNOSIS — R6889 Other general symptoms and signs: Secondary | ICD-10-CM | POA: Diagnosis not present

## 2018-11-22 DIAGNOSIS — J988 Other specified respiratory disorders: Secondary | ICD-10-CM

## 2018-11-22 DIAGNOSIS — R0602 Shortness of breath: Secondary | ICD-10-CM

## 2018-11-22 MED ORDER — HYDROCODONE-HOMATROPINE 5-1.5 MG/5ML PO SYRP: 5.0000 mL | ORAL_SOLUTION | Freq: Three times a day (TID) | ORAL | 0 refills | Status: AC | PRN

## 2018-11-22 MED ORDER — ALBUTEROL SULFATE HFA 108 (90 BASE) MCG/ACT IN AERS: 2.0000 | INHALATION_SPRAY | Freq: Four times a day (QID) | RESPIRATORY_TRACT | 0 refills | Status: DC | PRN

## 2018-11-22 MED ORDER — AZITHROMYCIN 250 MG PO TABS: ORAL_TABLET | ORAL | 0 refills | Status: DC

## 2018-11-22 NOTE — Telephone Encounter (Signed)
Pt called and appt  made 

## 2018-11-22 NOTE — Progress Notes (Signed)
Subjective Chief Complaint  Patient presents with  . allergies    allergies, itchy water eyes, cough, SOB x 2 weeks,  has flu 2 weeks ago and was on tamiflu     Documentation for Virtual face to face through WebEx encounter:  This virtual service is not related to other E/M service within previous 7 days.  Patient consented to the consult.  This virtual consult involved the patient and myself, Dorothea Ogle PA-C.   HPI She reports ongoing symptoms of cough.    Has a small child, doing school at home currently.  Environment at work is 3rd floor in [employer] with no windows, lots of sick people at work lately.     She recently had the flu about 2 weeks ago and hasn't fully recovered.  This past Friday 6 days ago started abruptly with flu symptoms.  Within hours had body aches, fever, not feeling well, and within a few hours went to [facility].   Was tested for flu, had negative flu test.  Was given tamiflu and cough syrup, but by the following Monday few days later symptoms changed.   She ended up seeing Harland Dingwall, NP here for eval .  Was sent for chest xray which was normal.   The worse SOB was at that time.    Currently she still reports dry cough, has dry throat, has some post nasal drainage ,has itchy and watery eyes.  Has some SOB, intermittent since catching the flu.   Can't breath lying flat this week.   No edema, no calve pain or swelling.  No fever.  Was out last week off while her employer was determining child care.   Works for [employer].  Had childhood asthma, and hasn't had a lot of problems as an adult until this illness.    This illness has felt way worse than typical flu.  Overall is some improved.    No recent travel  Past Medical History:  Diagnosis Date  . Acne   . Anxiety   . Chronic headache 2018   improved after treatment for anxiety 2018  . History of sinusitis   . Insomnia   . IUD (intrauterine device) in place    Mirena  . Joint  pain   . Obesity   . Uterine fibroid    Current Outpatient Medications on File Prior to Visit  Medication Sig Dispense Refill  . ALPRAZolam (XANAX) 1 MG tablet Take 1 mg by mouth at bedtime as needed for anxiety.    . Aspirin-Acetaminophen-Caffeine (TGT MIGRAINE RELIEF PO) Take by mouth.    Marland Kitchen BROMPHENIRAMINE-PHENYLEPHRINE PO Take by mouth. Cough medicine    . Oseltamivir Phosphate (TAMIFLU PO) Take by mouth.     No current facility-administered medications on file prior to visit.     Objective: Temp 99 F (37.2 C) (Oral)   Ht 5\' 6"  (1.676 m)   Wt 194 lb (88 kg)   LMP 11/12/2018 (Exact Date)   BMI 31.31 kg/m   Wt Readings from Last 3 Encounters:  11/22/18 194 lb (88 kg)  10/23/18 197 lb (89.4 kg)  04/20/18 208 lb (94.3 kg)   Gen: seems stable on video WebEx, but does have moderate cough at times.  Answers questions in complete sentences without dyspnea. Well appearing otherwise    Assessment: Encounter Diagnoses  Name Primary?  Marland Kitchen Respiratory tract infection Yes  . SOB (shortness of breath)   . Flu-like symptoms   . Other fatigue   .  Mild intermittent asthma, unspecified whether complicated      Plan: Reviewed 10/23/18 office note here with Vickie NP  Discussed diagnosis of flu or coronavirus type illness, lingering respiratory symptoms.  Begin medications below.  Discussed supportive care including rest, hydration, OTC Tylenol for fever, aches, and malaise.  Discussed period of contagion, self quarantine at home away from others to avoid spread of disease.  Discussed the need to be symptom free for 72 hours including fever before going back to work or interacting with others.    Discussed means of transmission, and possible complications including pneumonia and/or respiratory distress.  If worse or not improving within the next 4-5 days, then call or return.  Advised that if dehydration or respiratory distress or worsening shortness of breath over the next several  days then report to the emergency department  Patient voiced understanding of diagnosis, recommendations, and treatment plan.  Gave note for work.  Advised she call back in 3-4 days to give me update on symptoms.    Gilda was seen today for allergies.  Diagnoses and all orders for this visit:  Respiratory tract infection  SOB (shortness of breath)  Flu-like symptoms  Other fatigue  Mild intermittent asthma, unspecified whether complicated  Other orders -     albuterol (PROVENTIL HFA;VENTOLIN HFA) 108 (90 Base) MCG/ACT inhaler; Inhale 2 puffs into the lungs every 6 (six) hours as needed for wheezing or shortness of breath. -     azithromycin (ZITHROMAX) 250 MG tablet; 2 tablets day 1, then 1 tablet days 2-4 -     HYDROcodone-homatropine (HYCODAN) 5-1.5 MG/5ML syrup; Take 5 mLs by mouth every 8 (eight) hours as needed for up to 5 days.    Phone face to face time 30 min

## 2018-11-22 NOTE — Patient Instructions (Signed)
Recommendations?

## 2018-12-22 DIAGNOSIS — F3176 Bipolar disorder, in full remission, most recent episode depressed: Secondary | ICD-10-CM | POA: Diagnosis not present

## 2018-12-22 DIAGNOSIS — F3174 Bipolar disorder, in full remission, most recent episode manic: Secondary | ICD-10-CM | POA: Diagnosis not present

## 2018-12-22 DIAGNOSIS — F4322 Adjustment disorder with anxiety: Secondary | ICD-10-CM | POA: Diagnosis not present

## 2018-12-22 DIAGNOSIS — F900 Attention-deficit hyperactivity disorder, predominantly inattentive type: Secondary | ICD-10-CM | POA: Diagnosis not present

## 2019-07-26 ENCOUNTER — Other Ambulatory Visit: Payer: Self-pay | Admitting: Family Medicine

## 2019-07-26 ENCOUNTER — Other Ambulatory Visit: Payer: Self-pay | Admitting: Medical

## 2019-07-26 MED ORDER — ALBUTEROL SULFATE HFA 108 (90 BASE) MCG/ACT IN AERS: 2.0000 | INHALATION_SPRAY | Freq: Four times a day (QID) | RESPIRATORY_TRACT | 0 refills | Status: DC | PRN

## 2019-08-18 ENCOUNTER — Other Ambulatory Visit: Payer: Self-pay | Admitting: Family Medicine

## 2019-08-19 ENCOUNTER — Telehealth: Payer: Self-pay | Admitting: Medical

## 2019-08-19 NOTE — Telephone Encounter (Signed)
Patient has scheduled an appointment for tomorrow.

## 2019-08-19 NOTE — Telephone Encounter (Signed)
Please call patient in regards to their inhaler.  I received a refill request on albuterol rescue inhaler.  This medicine could go by the name Ventolin, ProAir, Proventil or just plain albuterol.  We monitor inhaler refill request to help determine if the patient is having problems with asthma.  If someone uses an inhaler frequently enough that refills are being requested regularly, then the consideration is that may be your asthma is not controlled and could be benefited by other therapy.    Please offer appointment to discuss asthma severity, triggers of asthma, and treatment recommendations.

## 2019-08-19 NOTE — Telephone Encounter (Signed)
[facility] is requesting to fill pt inhaler. Looks as if Dr. Redmond School filled it three weeks ago. Please advise . [place]

## 2019-08-20 ENCOUNTER — Ambulatory Visit: Payer: BC Managed Care – PPO | Admitting: Medical

## 2019-08-20 ENCOUNTER — Other Ambulatory Visit: Payer: Self-pay

## 2019-08-20 ENCOUNTER — Encounter: Payer: Self-pay | Admitting: Medical

## 2019-08-20 VITALS — BP 130/82 | HR 84 | Temp 97.6°F | Ht 66.0 in | Wt 189.2 lb

## 2019-08-20 DIAGNOSIS — Z7189 Other specified counseling: Secondary | ICD-10-CM | POA: Diagnosis not present

## 2019-08-20 DIAGNOSIS — Z1231 Encounter for screening mammogram for malignant neoplasm of breast: Secondary | ICD-10-CM

## 2019-08-20 DIAGNOSIS — R638 Other symptoms and signs concerning food and fluid intake: Secondary | ICD-10-CM

## 2019-08-20 DIAGNOSIS — D259 Leiomyoma of uterus, unspecified: Secondary | ICD-10-CM

## 2019-08-20 DIAGNOSIS — R9431 Abnormal electrocardiogram [ECG] [EKG]: Secondary | ICD-10-CM

## 2019-08-20 DIAGNOSIS — F419 Anxiety disorder, unspecified: Secondary | ICD-10-CM

## 2019-08-20 DIAGNOSIS — J4520 Mild intermittent asthma, uncomplicated: Secondary | ICD-10-CM | POA: Diagnosis not present

## 2019-08-20 DIAGNOSIS — Z0000 Encounter for general adult medical examination without abnormal findings: Secondary | ICD-10-CM

## 2019-08-20 DIAGNOSIS — Z7185 Encounter for immunization safety counseling: Secondary | ICD-10-CM

## 2019-08-20 DIAGNOSIS — Z23 Encounter for immunization: Secondary | ICD-10-CM

## 2019-08-20 DIAGNOSIS — R0602 Shortness of breath: Secondary | ICD-10-CM

## 2019-08-20 DIAGNOSIS — R5381 Other malaise: Secondary | ICD-10-CM | POA: Insufficient documentation

## 2019-08-20 MED ORDER — ALBUTEROL SULFATE HFA 108 (90 BASE) MCG/ACT IN AERS: 2.0000 | INHALATION_SPRAY | Freq: Four times a day (QID) | RESPIRATORY_TRACT | 0 refills | Status: DC | PRN

## 2019-08-20 MED ORDER — BREO ELLIPTA 100-25 MCG/INH IN AEPB: 1.0000 | INHALATION_SPRAY | Freq: Every day | RESPIRATORY_TRACT | 0 refills | Status: DC

## 2019-08-20 NOTE — Progress Notes (Signed)
Subjective Chief Complaint  Patient presents with  . Annual Exam   Here for yearly checkup.  Fasting.  Here for asthma f/u.  In recent months having more problems with asthma.   Using albuterol a lot in recent months.  Gets SOB at times talking.    Been exercising some.  If doing a lot of steps at home or at a business, has to stop and catch breath at times.   Sometimes gets a cough lying down.  Not exercising regularly and feels out of shape in general.  Gets anxious at times, uses xanax prn.  Sometimes anxiety can make her feel more SOB.    Working from home.  Works for [employer].   She has been seeing gynecology for uterine fibroids, would like referral to give her gynecologist.  She has a full count with that office, the lobby is always busy, and she just wanted to try different practice.    She actually had an IUD to fall out spontaneously a couple months ago.  She has had prior IUDs and tolerated them just fine  Past Medical History:  Diagnosis Date  . Acne   . Anxiety   . Chronic headache 2018   improved after treatment for anxiety 2018  . History of sinusitis   . Insomnia   . IUD (intrauterine device) in place    Mirena  . Joint pain   . Obesity   . Uterine fibroid    Current Outpatient Medications on File Prior to Visit  Medication Sig Dispense Refill  . ALPRAZolam (XANAX) 1 MG tablet Take 1 mg by mouth at bedtime as needed for anxiety.    . Aspirin-Acetaminophen-Caffeine (TGT MIGRAINE RELIEF PO) Take by mouth.     No current facility-administered medications on file prior to visit.   Family History  Problem Relation Age of Onset  . Hypertension Mother   . Depression Maternal Grandmother   . Hypertension Maternal Grandmother   . Diabetes Maternal Grandfather   . Hypertension Maternal Grandfather   . Stroke Maternal Aunt   . Cancer Neg Hx   . Heart disease Neg Hx    ROS as in subjective    Objective: BP 130/82   Pulse 84   Temp 97.6 F (36.4 C)   Ht  5\' 6"  (1.676 m)   Wt 189 lb 3.2 oz (85.8 kg)   SpO2 99%   BMI 30.54 kg/m   Wt Readings from Last 3 Encounters:  08/20/19 189 lb 3.2 oz (85.8 kg)  11/22/18 194 lb (88 kg)  10/23/18 197 lb (89.4 kg)   BP Readings from Last 3 Encounters:  08/20/19 130/82  10/23/18 130/80  04/20/18 (!) 140/98    General appearence: alert, no distress, WD/WN, African-American female HEENT: normocephalic, sclerae anicteric, PERRLA, EOMi, nares patent, no discharge or erythema, pharynx normal Oral cavity: MMM, no lesions Neck: supple, no lymphadenopathy, no thyromegaly, no masses, no bruits Heart: RRR, normal S1, S2, no murmurs Lungs: CTA bilaterally, no wheezes, rhonchi, or rales Abdomen: +bs, soft, there seems to be a firm seemingly moderate sized fibroid uterus, otherwise non tender, non distended, no masses, no hepatomegaly, no splenomegaly Back: non tender Musculoskeletal: nontender, no swelling, no obvious deformity Extremities: no edema, no cyanosis, no clubbing Pulses: 2+ symmetric, upper and lower extremities, normal cap refill Neurological: alert, oriented x 3, CN2-12 intact, strength normal upper extremities and lower extremities, sensation normal throughout, DTRs 2+ throughout, no cerebellar signs, gait normal Psychiatric: normal affect, behavior normal, pleasant  Breast, GYN, rectal deferred to gynecology   Assessment: Encounter Diagnoses  Name Primary?  . Routine general medical examination at a health care facility Yes  . Mild intermittent asthma without complication   . Vaccine counseling   . Need for pneumococcal vaccination   . Uterine leiomyoma, unspecified location   . SOB (shortness of breath)   . Abnormal EKG   . Abnormal craving   . Anxiety   . Physical deconditioning   . Encounter for screening mammogram for malignant neoplasm of breast     Plan: Physical exam - discussed and counseled on healthy lifestyle, diet, exercise, preventative care, vaccinations, sick and  well care, proper use of emergency dept and after hours care, and addressed their concerns.    Health screening: See your eye doctor yearly for routine vision care. See your dentist yearly for routine dental care including hygiene visits twice yearly.  Cancer screening We discussed self breast exams, yearly female exam, Pap smears  Referral to Earnstine Regal at [facility] for consult for a second opinion on fibroids  Advise baseline mammogram at this age     Vaccinations: Advised yearly influenza vaccine  Counseled on the pneumococcal vaccine.  Vaccine information sheet given.  Pneumococcal vaccine PPSV23 given after consent obtained.  She is up-to-date on tetanus diphtheria pertussis booster    Separate significant issues discussed: Shortness of breath, questionable asthma-PFT reviewed today.  Based on her symptoms she is needing to use albuterol regularly.  We will begin trial of Breo to see if that makes any difference.  Discussed proper use of medication.  We discussed other causes of shortness of breath.  I had sent her to cardiology last year and we will call and get a copy of the echocardiogram she had regarding abnormal EKG.  Her recollection was that it was normal.  Referral to gynecology for second opinion on fibroids, and due for baseline mammogram  Deconditioning-discussed need to gradually start back with regular exercise, counseled on healthy diet  Anxiety-consider coping skills, relaxation and other ways to deal with anxiety   Onda was seen today for annual exam.  Diagnoses and all orders for this visit:  Routine general medical examination at a health care facility -     Comprehensive metabolic panel -     CBC with Differential -     Iron and TIBC -     Lipid Panel -     MM Digital Screening; Future  Mild intermittent asthma without complication -     Spirometry with graph  Vaccine counseling  Need for pneumococcal vaccination  Uterine  leiomyoma, unspecified location -     CBC with Differential -     Iron and TIBC -     Ambulatory referral to Gynecology  SOB (shortness of breath)  Abnormal EKG  Abnormal craving -     CBC with Differential -     Iron and TIBC  Anxiety  Physical deconditioning  Encounter for screening mammogram for malignant neoplasm of breast -     MM Digital Screening; Future  Other orders -     Pneumococcal polysaccharide vaccine 23-valent greater than or equal to 2yo subcutaneous/IM -     fluticasone furoate-vilanterol (BREO ELLIPTA) 100-25 MCG/INH AEPB; Inhale 1 puff into the lungs daily. -     albuterol (VENTOLIN HFA) 108 (90 Base) MCG/ACT inhaler; Inhale 2 puffs into the lungs every 6 (six) hours as needed for wheezing or shortness of breath.   Follow-up pending  labs, yearly for physical

## 2019-08-21 ENCOUNTER — Other Ambulatory Visit: Payer: Self-pay | Admitting: Medical

## 2019-08-21 DIAGNOSIS — R5383 Other fatigue: Secondary | ICD-10-CM

## 2019-08-21 DIAGNOSIS — D500 Iron deficiency anemia secondary to blood loss (chronic): Secondary | ICD-10-CM

## 2019-08-21 LAB — CBC WITH DIFFERENTIAL/PLATELET
Basophils Absolute: 0.1 10*3/uL (ref 0.0–0.2)
Basos: 2 %
EOS (ABSOLUTE): 0.3 10*3/uL (ref 0.0–0.4)
Eos: 7 %
Hematocrit: 27.6 % — ABNORMAL LOW (ref 34.0–46.6)
Hemoglobin: 7.7 g/dL — ABNORMAL LOW (ref 11.1–15.9)
Immature Grans (Abs): 0 10*3/uL (ref 0.0–0.1)
Immature Granulocytes: 0 %
Lymphocytes Absolute: 1.5 10*3/uL (ref 0.7–3.1)
Lymphs: 39 %
MCH: 18.9 pg — ABNORMAL LOW (ref 26.6–33.0)
MCHC: 27.9 g/dL — ABNORMAL LOW (ref 31.5–35.7)
MCV: 68 fL — ABNORMAL LOW (ref 79–97)
Monocytes Absolute: 0.2 10*3/uL (ref 0.1–0.9)
Monocytes: 6 %
Neutrophils Absolute: 1.8 10*3/uL (ref 1.4–7.0)
Neutrophils: 46 %
Platelets: 270 10*3/uL (ref 150–450)
RBC: 4.07 x10E6/uL (ref 3.77–5.28)
RDW: 17.7 % — ABNORMAL HIGH (ref 11.7–15.4)
WBC: 3.9 10*3/uL (ref 3.4–10.8)

## 2019-08-21 LAB — LIPID PANEL
Chol/HDL Ratio: 2.9 ratio (ref 0.0–4.4)
Cholesterol, Total: 183 mg/dL (ref 100–199)
HDL: 63 mg/dL (ref >39)
LDL Chol Calc (NIH): 108 mg/dL — ABNORMAL HIGH (ref 0–99)
Triglycerides: 65 mg/dL (ref 0–149)
VLDL Cholesterol Cal: 12 mg/dL (ref 5–40)

## 2019-08-21 LAB — COMPREHENSIVE METABOLIC PANEL
ALT: 13 IU/L (ref 0–32)
AST: 17 IU/L (ref 0–40)
Albumin/Globulin Ratio: 1.5 (ref 1.2–2.2)
Albumin: 4.3 g/dL (ref 3.8–4.8)
Alkaline Phosphatase: 55 IU/L (ref 39–117)
BUN/Creatinine Ratio: 12 (ref 9–23)
BUN: 9 mg/dL (ref 6–24)
Bilirubin Total: 0.2 mg/dL (ref 0.0–1.2)
CO2: 20 mmol/L (ref 20–29)
Calcium: 9.2 mg/dL (ref 8.7–10.2)
Chloride: 108 mmol/L — ABNORMAL HIGH (ref 96–106)
Creatinine, Ser: 0.75 mg/dL (ref 0.57–1.00)
GFR calc Af Amer: 114 mL/min/{1.73_m2} (ref >59)
GFR calc non Af Amer: 99 mL/min/{1.73_m2} (ref >59)
Globulin, Total: 2.9 g/dL (ref 1.5–4.5)
Glucose: 86 mg/dL (ref 65–99)
Potassium: 4.2 mmol/L (ref 3.5–5.2)
Sodium: 139 mmol/L (ref 134–144)
Total Protein: 7.2 g/dL (ref 6.0–8.5)

## 2019-08-21 LAB — IRON AND TIBC
Iron Saturation: 3 % — CL (ref 15–55)
Iron: 13 ug/dL — ABNORMAL LOW (ref 27–159)
Total Iron Binding Capacity: 441 ug/dL (ref 250–450)
UIBC: 428 ug/dL — ABNORMAL HIGH (ref 131–425)

## 2019-08-21 MED ORDER — FERROUS GLUCONATE 324 (38 FE) MG PO TABS: 324.0000 mg | ORAL_TABLET | Freq: Two times a day (BID) | ORAL | 3 refills | Status: DC

## 2019-09-23 ENCOUNTER — Ambulatory Visit: Payer: BC Managed Care – PPO | Admitting: Medical

## 2019-09-30 ENCOUNTER — Encounter: Payer: Self-pay | Admitting: Medical

## 2019-09-30 ENCOUNTER — Ambulatory Visit (INDEPENDENT_AMBULATORY_CARE_PROVIDER_SITE_OTHER): Payer: BC Managed Care – PPO | Admitting: Medical

## 2019-09-30 ENCOUNTER — Other Ambulatory Visit: Payer: Self-pay

## 2019-09-30 VITALS — BP 142/88 | HR 89 | Temp 98.4°F | Ht 66.0 in | Wt 192.4 lb

## 2019-09-30 DIAGNOSIS — Z975 Presence of (intrauterine) contraceptive device: Secondary | ICD-10-CM | POA: Insufficient documentation

## 2019-09-30 DIAGNOSIS — D509 Iron deficiency anemia, unspecified: Secondary | ICD-10-CM | POA: Insufficient documentation

## 2019-09-30 DIAGNOSIS — D259 Leiomyoma of uterus, unspecified: Secondary | ICD-10-CM | POA: Insufficient documentation

## 2019-09-30 DIAGNOSIS — R102 Pelvic and perineal pain: Secondary | ICD-10-CM

## 2019-09-30 DIAGNOSIS — Z8489 Family history of other specified conditions: Secondary | ICD-10-CM | POA: Diagnosis not present

## 2019-09-30 LAB — POCT URINE PREGNANCY: Preg Test, Ur: NEGATIVE

## 2019-09-30 NOTE — Progress Notes (Signed)
Subjective:  Alisha Coffey is a 44 y.o. female who presents for Chief Complaint  Patient presents with  . Follow-up    iron levels      Here today for follow-up on anemia.  At her physical back in December her hemoglobin was found to be 7.7.  She started iron therapy and is taking this twice daily.  As long as she takes it with food she does fine.  We presume the anemia is due to longstanding heavy periods.  Her last menstrual period was 08/05/2019.  She notes this past year approximately 6 months ago she had several months in a row with heavy bleeding, then the next few months the bleeding was less but she had really bad cramping with her periods.  She had breakthrough bleeding back in November 2020.  She was feeling really fatigued and was craving ice.  She does feel improved in energy level since last visit a month ago.  She notes that she has had several IUDs fallout in the past.  She had one come out back in August and a new one was placed through gynecology.  She assumes the current IUD is in place.  Last visit she wanted a referral to a different gynecologist and she sees them this week.   Her last pregnancy was roughly 10 years ago.   She does have significant family history of uterine fibroids and has been told she has fibroids in the past.  She denies blood in the stool, no unusual  bleeding or bruising otherwise.  No other aggravating or relieving factors.    No other c/o.  The following portions of the patient's history were reviewed and updated as appropriate: allergies, current medications, past family history, past medical history, past social history, past surgical history and problem list.  ROS Otherwise as in subjective above   Objective: BP (!) 142/88   Pulse 89   Temp 98.4 F (36.9 C)   Ht 5\' 6"  (1.676 m)   Wt 192 lb 6.4 oz (87.3 kg)   SpO2 98%   BMI 31.05 kg/m   General appearance: alert, no distress, well developed, well nourished Neck: supple, no  lymphadenopathy, no thyromegaly, no masses Abdomen: +bs, soft, non tender, her uterus appears to be enlarged and palpated superiorly to about the umbilicus and it seems to be asymmetrical with majority of the palpable fullness on the right side and center but not the left, no hepatomegaly, no splenomegaly Pulses: 2+ radial pulses, 2+ pedal pulses, normal cap refill Ext: no edema   Assessment: Encounter Diagnoses  Name Primary?  . Iron deficiency anemia, unspecified iron deficiency anemia type Yes  . Uterine leiomyoma, unspecified location   . Tenderness of female pelvic organs   . Family history of uterine fibroid   . IUD (intrauterine device) in place      Plan: Recheck on labs today after starting iron about a month ago.  She is tolerating iron twice daily.  She has follow-up with gynecology this week.  Urine pregnancy negative today.  The presumption is heavy uterine bleeding as a cause of the anemia.  We may consider doing stool cards pending labs.  We discussed other potential causes of anemia.  We discussed the fibroids and possible treatment recommendations that gynecology may offer.   Alisha Coffey was seen today for follow-up.  Diagnoses and all orders for this visit:  Iron deficiency anemia, unspecified iron deficiency anemia type -     CBC with Differential/Platelet -  Iron  Uterine leiomyoma, unspecified location -     POCT urine pregnancy  Tenderness of female pelvic organs -     CBC with Differential/Platelet -     Iron -     POCT urine pregnancy  Family history of uterine fibroid -     POCT urine pregnancy  IUD (intrauterine device) in place   Follow up: pending labs

## 2019-10-01 LAB — CBC WITH DIFFERENTIAL/PLATELET
Basophils Absolute: 0.1 10*3/uL (ref 0.0–0.2)
Basos: 1 %
EOS (ABSOLUTE): 0.3 10*3/uL (ref 0.0–0.4)
Eos: 8 %
Hematocrit: 36.1 % (ref 34.0–46.6)
Hemoglobin: 10.6 g/dL — ABNORMAL LOW (ref 11.1–15.9)
Immature Grans (Abs): 0 10*3/uL (ref 0.0–0.1)
Immature Granulocytes: 0 %
Lymphocytes Absolute: 1.4 10*3/uL (ref 0.7–3.1)
Lymphs: 33 %
MCH: 22.6 pg — ABNORMAL LOW (ref 26.6–33.0)
MCHC: 29.4 g/dL — ABNORMAL LOW (ref 31.5–35.7)
MCV: 77 fL — ABNORMAL LOW (ref 79–97)
Monocytes Absolute: 0.3 10*3/uL (ref 0.1–0.9)
Monocytes: 7 %
Neutrophils Absolute: 2.1 10*3/uL (ref 1.4–7.0)
Neutrophils: 51 %
Platelets: 264 10*3/uL (ref 150–450)
RBC: 4.68 x10E6/uL (ref 3.77–5.28)
RDW: 26.1 % — ABNORMAL HIGH (ref 11.7–15.4)
WBC: 4.2 10*3/uL (ref 3.4–10.8)

## 2019-10-01 LAB — IRON: Iron: 265 ug/dL (ref 27–159)

## 2019-10-04 ENCOUNTER — Ambulatory Visit: Payer: Self-pay

## 2019-10-07 ENCOUNTER — Other Ambulatory Visit: Payer: Self-pay | Admitting: Obstetrics and Gynecology

## 2019-10-07 DIAGNOSIS — N920 Excessive and frequent menstruation with regular cycle: Secondary | ICD-10-CM | POA: Diagnosis not present

## 2019-10-07 DIAGNOSIS — Z6830 Body mass index (BMI) 30.0-30.9, adult: Secondary | ICD-10-CM | POA: Diagnosis not present

## 2019-10-07 DIAGNOSIS — Z124 Encounter for screening for malignant neoplasm of cervix: Secondary | ICD-10-CM | POA: Diagnosis not present

## 2019-10-07 DIAGNOSIS — Z01419 Encounter for gynecological examination (general) (routine) without abnormal findings: Secondary | ICD-10-CM | POA: Diagnosis not present

## 2019-10-07 DIAGNOSIS — Z113 Encounter for screening for infections with a predominantly sexual mode of transmission: Secondary | ICD-10-CM | POA: Diagnosis not present

## 2019-10-07 DIAGNOSIS — R399 Unspecified symptoms and signs involving the genitourinary system: Secondary | ICD-10-CM | POA: Diagnosis not present

## 2019-10-07 LAB — HM PAP SMEAR: HM Pap smear: NEGATIVE

## 2019-10-07 LAB — RESULTS CONSOLE HPV: CHL HPV: NEGATIVE

## 2019-10-08 ENCOUNTER — Other Ambulatory Visit: Payer: BC Managed Care – PPO

## 2019-10-14 ENCOUNTER — Ambulatory Visit
Admission: RE | Admit: 2019-10-14 | Discharge: 2019-10-14 | Disposition: A | Discharge status: Home / Self Care | Payer: BC Managed Care – PPO | Source: Ambulatory Visit | Attending: Obstetrics and Gynecology | Admitting: Obstetrics and Gynecology

## 2019-10-14 ENCOUNTER — Other Ambulatory Visit: Payer: BC Managed Care – PPO

## 2019-10-14 DIAGNOSIS — N920 Excessive and frequent menstruation with regular cycle: Secondary | ICD-10-CM | POA: Diagnosis not present

## 2019-10-14 DIAGNOSIS — N852 Hypertrophy of uterus: Secondary | ICD-10-CM | POA: Diagnosis not present

## 2019-10-16 ENCOUNTER — Other Ambulatory Visit: Payer: Self-pay

## 2019-10-16 DIAGNOSIS — N920 Excessive and frequent menstruation with regular cycle: Secondary | ICD-10-CM | POA: Diagnosis not present

## 2019-10-16 DIAGNOSIS — D259 Leiomyoma of uterus, unspecified: Secondary | ICD-10-CM | POA: Diagnosis not present

## 2019-10-16 DIAGNOSIS — D649 Anemia, unspecified: Secondary | ICD-10-CM | POA: Diagnosis not present

## 2019-10-16 DIAGNOSIS — N946 Dysmenorrhea, unspecified: Secondary | ICD-10-CM | POA: Diagnosis not present

## 2019-10-24 ENCOUNTER — Telehealth: Payer: Self-pay

## 2019-10-24 ENCOUNTER — Other Ambulatory Visit: Payer: Self-pay | Admitting: Medical

## 2019-10-24 MED ORDER — ALBUTEROL SULFATE HFA 108 (90 BASE) MCG/ACT IN AERS: 2.0000 | INHALATION_SPRAY | Freq: Four times a day (QID) | RESPIRATORY_TRACT | 0 refills | Status: DC | PRN

## 2019-10-24 MED ORDER — BREO ELLIPTA 200-25 MCG/INH IN AEPB: 1.0000 | INHALATION_SPRAY | Freq: Every day | RESPIRATORY_TRACT | 2 refills | Status: DC

## 2019-10-24 NOTE — Telephone Encounter (Signed)
I sent Breo as a prescription but higher dose, continue albuterol every 6 hours as needed.  Breo was once daily.  Let us go ahead and set up follow-up visit.  I have not received records from gynecology but I assume she has seen them recently since her last visit

## 2019-10-24 NOTE — Telephone Encounter (Signed)
I called the pt. And got her scheduled for a f/u apt. Tomorrow and she said that yes she has been to the obgyn office since her last visit here.

## 2019-10-24 NOTE — Telephone Encounter (Signed)
Pt. Called stating that she wasn't sure if she needed to come up here or if you could call her in prescriptions for her inhalers that you gave her last time she was here, she said it was Breo inhaler you gave her a sample of once in the a.m. and it was working, she has her rescue inhaler Ventolin but it doesn't seem to be working at all.

## 2019-10-25 ENCOUNTER — Ambulatory Visit: Payer: BC Managed Care – PPO | Admitting: Medical

## 2019-10-28 ENCOUNTER — Encounter: Payer: Self-pay | Admitting: Medical

## 2019-11-08 ENCOUNTER — Ambulatory Visit: Payer: BC Managed Care – PPO

## 2019-11-11 DIAGNOSIS — R519 Headache, unspecified: Secondary | ICD-10-CM | POA: Diagnosis not present

## 2019-11-11 DIAGNOSIS — R110 Nausea: Secondary | ICD-10-CM | POA: Diagnosis not present

## 2019-11-11 DIAGNOSIS — Z20828 Contact with and (suspected) exposure to other viral communicable diseases: Secondary | ICD-10-CM | POA: Diagnosis not present

## 2019-11-12 ENCOUNTER — Other Ambulatory Visit: Payer: Self-pay | Admitting: Medical

## 2019-11-14 DIAGNOSIS — D259 Leiomyoma of uterus, unspecified: Secondary | ICD-10-CM | POA: Diagnosis not present

## 2019-11-18 DIAGNOSIS — F3111 Bipolar disorder, current episode manic without psychotic features, mild: Secondary | ICD-10-CM | POA: Diagnosis not present

## 2019-11-18 DIAGNOSIS — F3131 Bipolar disorder, current episode depressed, mild: Secondary | ICD-10-CM | POA: Diagnosis not present

## 2019-11-18 DIAGNOSIS — F4322 Adjustment disorder with anxiety: Secondary | ICD-10-CM | POA: Diagnosis not present

## 2019-12-11 ENCOUNTER — Ambulatory Visit: Admit: 2019-12-11 | Payer: BC Managed Care – PPO | Admitting: Obstetrics & Gynecology

## 2019-12-11 SURGERY — HYSTERECTOMY, TOTAL, ABDOMINAL, WITH SALPINGECTOMY
Anesthesia: General | Laterality: Left

## 2020-02-06 ENCOUNTER — Other Ambulatory Visit: Payer: Self-pay | Admitting: Obstetrics & Gynecology

## 2020-02-19 ENCOUNTER — Telehealth: Payer: Self-pay | Admitting: Medical

## 2020-02-19 NOTE — Telephone Encounter (Signed)
Dismissal letter in guarantor snapshot  °

## 2020-03-13 NOTE — Progress Notes (Signed)
I spoke  to Alisha Coffey who said she notified the office last week by patient portal and spoke with someone in the office. Patient said that it is no longer showing on portal.  I called OR desk and spoke with Benjie Karvonen, she said she will place it in the reschedule section.

## 2020-03-16 ENCOUNTER — Inpatient Hospital Stay (HOSPITAL_COMMUNITY)
Admission: RE | Admit: 2020-03-16 | Payer: BC Managed Care – PPO | Source: Home / Self Care | Admitting: Obstetrics & Gynecology

## 2020-03-16 SURGERY — HYSTERECTOMY, TOTAL, ABDOMINAL, WITH SALPINGECTOMY
Anesthesia: General | Laterality: Left

## 2020-05-12 ENCOUNTER — Other Ambulatory Visit: Payer: Self-pay | Admitting: Medical

## 2020-05-21 ENCOUNTER — Other Ambulatory Visit: Payer: Self-pay | Admitting: Medical

## 2020-05-21 ENCOUNTER — Telehealth: Payer: Self-pay | Admitting: General Practice

## 2020-05-21 MED ORDER — ALBUTEROL SULFATE HFA 108 (90 BASE) MCG/ACT IN AERS: 2.0000 | INHALATION_SPRAY | Freq: Four times a day (QID) | RESPIRATORY_TRACT | 0 refills | Status: DC | PRN

## 2020-05-21 MED ORDER — BREO ELLIPTA 200-25 MCG/INH IN AEPB: 1.0000 | INHALATION_SPRAY | Freq: Every day | RESPIRATORY_TRACT | 1 refills | Status: DC

## 2020-05-21 NOTE — Telephone Encounter (Signed)
Called and left message for pt

## 2020-05-21 NOTE — Telephone Encounter (Signed)
Pt called and is requesting a refill on breo  She Is still a pt here she was ok to come back by shane 04/03/2020 please send to the [id number] [place], [place] - [address]

## 2020-05-21 NOTE — Telephone Encounter (Signed)
Pt has a CPE Nov the 2

## 2020-05-21 NOTE — Telephone Encounter (Signed)
Med sent.

## 2020-05-23 ENCOUNTER — Telehealth: Payer: Self-pay | Admitting: Medical

## 2020-05-23 NOTE — Telephone Encounter (Signed)
Recv'd [facility] from [facility] that Breo not covered by insurance preferred are fluticasone salmeterol, Symbicort, Dulera, Anoro Ellipta, Flovent HFA, Trelegy or Incruse.  Can pt be switched to either of these ? If so please send in alternative

## 2020-05-29 MED ORDER — FLUTICASONE-SALMETEROL 100-50 MCG/DOSE IN AEPB: 1.0000 | INHALATION_SPRAY | Freq: Two times a day (BID) | RESPIRATORY_TRACT | 1 refills | Status: DC

## 2020-06-01 ENCOUNTER — Other Ambulatory Visit: Payer: Self-pay | Admitting: Medical

## 2020-06-01 MED ORDER — ALBUTEROL SULFATE HFA 108 (90 BASE) MCG/ACT IN AERS: 2.0000 | INHALATION_SPRAY | Freq: Four times a day (QID) | RESPIRATORY_TRACT | 0 refills | Status: DC | PRN

## 2020-06-01 NOTE — Telephone Encounter (Signed)
I called and spoke to patient.  She will pick up Advair as an alternate to [facility].  She states she did really well on Breo but insurance co-pay is way more expensive.  She will switch to Advair prevention.  She will continue albuterol rescue as needed.  Advise follow-up in 1 month

## 2020-06-30 ENCOUNTER — Encounter: Payer: BC Managed Care – PPO | Admitting: Medical

## 2020-08-05 ENCOUNTER — Other Ambulatory Visit: Payer: Self-pay | Admitting: Obstetrics & Gynecology

## 2020-08-29 DIAGNOSIS — Z90710 Acquired absence of both cervix and uterus: Secondary | ICD-10-CM | POA: Insufficient documentation

## 2020-09-02 NOTE — Progress Notes (Signed)
[id number] [place], [place] - [address] [place] [address] Phone: [phone] Fax: [phone]      Your procedure is scheduled on January 10.  Report to [facility] Main Entrance "A" at 1015 A.M., and check in at the Admitting office.  Call this number if you have problems the morning of surgery:  [phone]  Call [phone] if you have any questions prior to your surgery date Monday-Friday 8am-4pm    Remember:  Do not eat after midnight the night before your surgery  You may drink clear liquids until 0915 am the morning of your surgery.   Clear liquids allowed are: Water, Non-Citrus Juices (without pulp), Carbonated Beverages, Clear Tea, Black Coffee Only, and Gatorade    Take these medicines the morning of surgery with A SIP OF WATER  acetaminophen (TYLENOL if needed ALPRAZolam Prudy Feeler) if needed Fluticasone-Salmeterol (ADVAIR)  If needed, Please bring all inhalers with you the day of surgery.  JUNEL FE loratadine (CLARITIN) if needed  As of today, STOP taking any Aspirin (unless otherwise instructed by your surgeon) Aleve, Naproxen, Ibuprofen, Motrin, Advil, Goody's, BC's, all herbal medications, fish oil, and all vitamins.                      Do not wear jewelry, make up, or nail polish            Do not wear lotions, powders, perfumes, or deodorant.            Do not shave 48 hours prior to surgery.              Do not bring valuables to the hospital.            [facility] is not responsible for any belongings or valuables.  Do NOT Smoke (Tobacco/Vaping) or drink Alcohol 24 hours prior to your procedure If you use a CPAP at night, you may bring all equipment for your overnight stay.   Contacts, glasses, dentures or bridgework may not be worn into surgery.      For patients admitted to the hospital, discharge time will be determined by your treatment team.   Patients discharged the day of  surgery will not be allowed to drive home, and someone needs to stay with them for 24 hours.    Special instructions:   [facility]- Preparing For Surgery  Before surgery, you can play an important role. Because skin is not sterile, your skin needs to be as free of germs as possible. You can reduce the number of germs on your skin by washing with CHG (chlorahexidine gluconate) Soap before surgery.  CHG is an antiseptic cleaner which kills germs and bonds with the skin to continue killing germs even after washing.    Oral Hygiene is also important to reduce your risk of infection.  Remember - BRUSH YOUR TEETH THE MORNING OF SURGERY WITH YOUR REGULAR TOOTHPASTE  Please do not use if you have an allergy to CHG or antibacterial soaps. If your skin becomes reddened/irritated stop using the CHG.  Do not shave (including legs and underarms) for at least 48 hours prior to first CHG shower. It is OK to shave your face.  Please follow these instructions carefully.   1. Shower the NIGHT BEFORE SURGERY and the MORNING OF SURGERY with CHG Soap.   2. If you chose to wash your hair, wash your hair first as usual with  your normal shampoo.  3. After you shampoo, rinse your hair and body thoroughly to remove the shampoo.  4. Use CHG as you would any other liquid soap. You can apply CHG directly to the skin and wash gently with a scrungie or a clean washcloth.   5. Apply the CHG Soap to your body ONLY FROM THE NECK DOWN.  Do not use on open wounds or open sores. Avoid contact with your eyes, ears, mouth and genitals (private parts). Wash Face and genitals (private parts)  with your normal soap.   6. Wash thoroughly, paying special attention to the area where your surgery will be performed.  7. Thoroughly rinse your body with warm water from the neck down.  8. DO NOT shower/wash with your normal soap after using and rinsing off the CHG Soap.  9. Pat yourself dry with a CLEAN TOWEL.  10. Wear CLEAN  PAJAMAS to bed the night before surgery  11. Place CLEAN SHEETS on your bed the night of your first shower and DO NOT SLEEP WITH PETS.   Day of Surgery: Wear Clean/Comfortable clothing the morning of surgery Do not apply any deodorants/lotions.   Remember to brush your teeth WITH YOUR REGULAR TOOTHPASTE.   Please read over the following fact sheets that you were given.

## 2020-09-03 ENCOUNTER — Other Ambulatory Visit (HOSPITAL_COMMUNITY)
Admission: RE | Admit: 2020-09-03 | Discharge: 2020-09-03 | Disposition: A | Discharge status: Home / Self Care | Payer: BC Managed Care – PPO | Source: Ambulatory Visit | Attending: Obstetrics & Gynecology | Admitting: Obstetrics & Gynecology

## 2020-09-03 ENCOUNTER — Encounter (HOSPITAL_COMMUNITY)
Admission: RE | Admit: 2020-09-03 | Discharge: 2020-09-03 | Disposition: A | Discharge status: Home / Self Care | Payer: BC Managed Care – PPO | Source: Ambulatory Visit | Attending: Obstetrics & Gynecology | Admitting: Obstetrics & Gynecology

## 2020-09-03 ENCOUNTER — Other Ambulatory Visit: Payer: Self-pay

## 2020-09-03 DIAGNOSIS — Z20822 Contact with and (suspected) exposure to covid-19: Secondary | ICD-10-CM | POA: Diagnosis not present

## 2020-09-03 DIAGNOSIS — Z01812 Encounter for preprocedural laboratory examination: Secondary | ICD-10-CM | POA: Diagnosis not present

## 2020-09-03 LAB — SARS CORONAVIRUS 2 (TAT 6-24 HRS): SARS Coronavirus 2: NEGATIVE

## 2020-09-03 NOTE — Progress Notes (Signed)
Patient's surgery date and time uncertain at this time.  Per Kennyth Arnold, at Dr. Madalyn Rob office patient can be a same day work-up.  Patient given instructions and CHG soap.

## 2020-09-04 NOTE — Progress Notes (Signed)
This pt was moved from [facility] main OR to [facility] today.  Per progress note by Jovita Kussmaul RN from 09-03-2020, stated pt was given printed instructions (they are in progress note's also) and was given CHG soap.Lab work to be done same day.  Called pt via phone and informed her that her surgery is at [facility].  Per verbalized understanding to arrive at 1000 , bring insurance card/ photo ID, address given.  Reviewed [facility] and visitor guidelines.  Answered all questions.

## 2020-09-07 ENCOUNTER — Encounter (HOSPITAL_BASED_OUTPATIENT_CLINIC_OR_DEPARTMENT_OTHER)
Admission: RE | Disposition: A | Discharge status: Home / Self Care | Payer: Self-pay | Source: Home / Self Care | Attending: Obstetrics & Gynecology

## 2020-09-07 ENCOUNTER — Observation Stay (HOSPITAL_BASED_OUTPATIENT_CLINIC_OR_DEPARTMENT_OTHER): Payer: BC Managed Care – PPO | Admitting: Physician Assistant

## 2020-09-07 ENCOUNTER — Ambulatory Visit (HOSPITAL_BASED_OUTPATIENT_CLINIC_OR_DEPARTMENT_OTHER)
Admission: RE | Admit: 2020-09-07 | Discharge: 2020-09-08 | Disposition: A | Discharge status: Home / Self Care | Payer: BC Managed Care – PPO | Attending: Obstetrics & Gynecology | Admitting: Obstetrics & Gynecology

## 2020-09-07 ENCOUNTER — Encounter (HOSPITAL_BASED_OUTPATIENT_CLINIC_OR_DEPARTMENT_OTHER): Payer: Self-pay | Admitting: Obstetrics & Gynecology

## 2020-09-07 DIAGNOSIS — N800 Endometriosis of uterus: Secondary | ICD-10-CM | POA: Insufficient documentation

## 2020-09-07 DIAGNOSIS — D219 Benign neoplasm of connective and other soft tissue, unspecified: Secondary | ICD-10-CM | POA: Diagnosis present

## 2020-09-07 DIAGNOSIS — Z885 Allergy status to narcotic agent status: Secondary | ICD-10-CM | POA: Insufficient documentation

## 2020-09-07 DIAGNOSIS — N72 Inflammatory disease of cervix uteri: Secondary | ICD-10-CM | POA: Insufficient documentation

## 2020-09-07 DIAGNOSIS — Z888 Allergy status to other drugs, medicaments and biological substances status: Secondary | ICD-10-CM | POA: Insufficient documentation

## 2020-09-07 DIAGNOSIS — D259 Leiomyoma of uterus, unspecified: Secondary | ICD-10-CM | POA: Diagnosis not present

## 2020-09-07 DIAGNOSIS — N920 Excessive and frequent menstruation with regular cycle: Secondary | ICD-10-CM | POA: Diagnosis not present

## 2020-09-07 DIAGNOSIS — R102 Pelvic and perineal pain: Secondary | ICD-10-CM | POA: Insufficient documentation

## 2020-09-07 HISTORY — PX: BILATERAL SALPINGECTOMY: SHX5743

## 2020-09-07 HISTORY — PX: ABDOMINAL HYSTERECTOMY: SHX81

## 2020-09-07 LAB — CBC
HCT: 34.3 % — ABNORMAL LOW (ref 36.0–46.0)
Hemoglobin: 10.6 g/dL — ABNORMAL LOW (ref 12.0–15.0)
MCH: 25.5 pg — ABNORMAL LOW (ref 26.0–34.0)
MCHC: 30.9 g/dL (ref 30.0–36.0)
MCV: 82.7 fL (ref 80.0–100.0)
Platelets: 376 10*3/uL (ref 150–400)
RBC: 4.15 MIL/uL (ref 3.87–5.11)
RDW: 17.6 % — ABNORMAL HIGH (ref 11.5–15.5)
WBC: 4.3 10*3/uL (ref 4.0–10.5)
nRBC: 0 % (ref 0.0–0.2)

## 2020-09-07 LAB — BASIC METABOLIC PANEL
Anion gap: 9 (ref 5–15)
BUN: 13 mg/dL (ref 6–20)
CO2: 19 mmol/L — ABNORMAL LOW (ref 22–32)
Calcium: 8.8 mg/dL — ABNORMAL LOW (ref 8.9–10.3)
Chloride: 107 mmol/L (ref 98–111)
Creatinine, Ser: 0.84 mg/dL (ref 0.44–1.00)
GFR, Estimated: >60 mL/min (ref >60)
Glucose, Bld: 95 mg/dL (ref 70–99)
Potassium: 3.7 mmol/L (ref 3.5–5.1)
Sodium: 135 mmol/L (ref 135–145)

## 2020-09-07 LAB — ABO/RH: ABO/RH(D): B POS

## 2020-09-07 LAB — TYPE AND SCREEN
ABO/RH(D): B POS
Antibody Screen: NEGATIVE

## 2020-09-07 LAB — POCT PREGNANCY, URINE: Preg Test, Ur: NEGATIVE

## 2020-09-07 SURGERY — HYSTERECTOMY, ABDOMINAL
Anesthesia: General

## 2020-09-07 MED ORDER — OXYCODONE-ACETAMINOPHEN 5-325 MG PO TABS
1.0000 | ORAL_TABLET | Freq: Four times a day (QID) | ORAL | Status: DC | PRN
  Administered 2020-09-07 – 2020-09-08 (×4): 2 via ORAL

## 2020-09-07 MED ORDER — CEFAZOLIN SODIUM-DEXTROSE 2-4 GM/100ML-% IV SOLN
INTRAVENOUS | Status: AC
  Filled 2020-09-07: qty 100

## 2020-09-07 MED ORDER — DEXAMETHASONE SODIUM PHOSPHATE 10 MG/ML IJ SOLN
INTRAMUSCULAR | Status: DC | PRN
  Administered 2020-09-07: 10 mg via INTRAVENOUS

## 2020-09-07 MED ORDER — POVIDONE-IODINE 10 % EX SWAB: 2.0000 "application " | Freq: Once | CUTANEOUS | Status: DC

## 2020-09-07 MED ORDER — MIDAZOLAM HCL 2 MG/2ML IJ SOLN
2.0000 mg | Freq: Once | INTRAMUSCULAR | Status: AC
  Administered 2020-09-07: 2 mg via INTRAVENOUS

## 2020-09-07 MED ORDER — PHENYLEPHRINE 40 MCG/ML (10ML) SYRINGE FOR IV PUSH (FOR BLOOD PRESSURE SUPPORT)
PREFILLED_SYRINGE | INTRAVENOUS | Status: AC
  Filled 2020-09-07: qty 10

## 2020-09-07 MED ORDER — PANTOPRAZOLE SODIUM 40 MG PO TBEC
DELAYED_RELEASE_TABLET | ORAL | Status: AC
  Filled 2020-09-07: qty 1

## 2020-09-07 MED ORDER — FENTANYL CITRATE (PF) 100 MCG/2ML IJ SOLN
INTRAMUSCULAR | Status: DC | PRN
  Administered 2020-09-07 (×5): 50 ug via INTRAVENOUS

## 2020-09-07 MED ORDER — FENTANYL CITRATE (PF) 100 MCG/2ML IJ SOLN
100.0000 ug | Freq: Once | INTRAMUSCULAR | Status: AC
  Administered 2020-09-07: 100 ug via INTRAVENOUS

## 2020-09-07 MED ORDER — OXYCODONE-ACETAMINOPHEN 5-325 MG PO TABS
ORAL_TABLET | ORAL | Status: AC
  Filled 2020-09-07: qty 2

## 2020-09-07 MED ORDER — HYDROMORPHONE HCL 1 MG/ML IJ SOLN
1.0000 mg | INTRAMUSCULAR | Status: DC | PRN
  Administered 2020-09-07: 1 mg via INTRAVENOUS

## 2020-09-07 MED ORDER — ONDANSETRON HCL 4 MG/2ML IJ SOLN
INTRAMUSCULAR | Status: DC | PRN
  Administered 2020-09-07: 4 mg via INTRAVENOUS

## 2020-09-07 MED ORDER — LACTATED RINGERS IV SOLN
INTRAVENOUS | Status: DC
Start: 2020-09-07 — End: 2020-09-08

## 2020-09-07 MED ORDER — KETAMINE HCL 10 MG/ML IJ SOLN
INTRAMUSCULAR | Status: AC
  Filled 2020-09-07: qty 1

## 2020-09-07 MED ORDER — DEXAMETHASONE SODIUM PHOSPHATE 4 MG/ML IJ SOLN
INTRAMUSCULAR | Status: DC | PRN
  Administered 2020-09-07 (×2): 3 mg

## 2020-09-07 MED ORDER — PHENYLEPHRINE 40 MCG/ML (10ML) SYRINGE FOR IV PUSH (FOR BLOOD PRESSURE SUPPORT)
PREFILLED_SYRINGE | INTRAVENOUS | Status: DC | PRN
  Administered 2020-09-07 (×2): 80 ug via INTRAVENOUS

## 2020-09-07 MED ORDER — ONDANSETRON HCL 4 MG PO TABS: 4.0000 mg | ORAL_TABLET | Freq: Four times a day (QID) | ORAL | Status: DC | PRN

## 2020-09-07 MED ORDER — EPHEDRINE SULFATE-NACL 50-0.9 MG/10ML-% IV SOSY
PREFILLED_SYRINGE | INTRAVENOUS | Status: DC | PRN
  Administered 2020-09-07: 10 mg via INTRAVENOUS
  Administered 2020-09-07: 15 mg via INTRAVENOUS

## 2020-09-07 MED ORDER — VASOPRESSIN 20 UNIT/ML IV SOLN
INTRAVENOUS | Status: DC | PRN
  Administered 2020-09-07: 20 mL

## 2020-09-07 MED ORDER — OXYCODONE HCL 5 MG PO TABS: 5.0000 mg | ORAL_TABLET | Freq: Once | ORAL | Status: DC | PRN

## 2020-09-07 MED ORDER — LIDOCAINE 2% (20 MG/ML) 5 ML SYRINGE
INTRAMUSCULAR | Status: DC | PRN
  Administered 2020-09-07: 100 mg via INTRAVENOUS

## 2020-09-07 MED ORDER — KETOROLAC TROMETHAMINE 30 MG/ML IJ SOLN
INTRAMUSCULAR | Status: AC
  Filled 2020-09-07: qty 1

## 2020-09-07 MED ORDER — PANTOPRAZOLE SODIUM 40 MG PO TBEC
40.0000 mg | DELAYED_RELEASE_TABLET | Freq: Every day | ORAL | Status: DC
  Administered 2020-09-07: 40 mg via ORAL

## 2020-09-07 MED ORDER — ROCURONIUM BROMIDE 10 MG/ML (PF) SYRINGE
PREFILLED_SYRINGE | INTRAVENOUS | Status: AC
  Filled 2020-09-07: qty 10

## 2020-09-07 MED ORDER — PROPOFOL 10 MG/ML IV BOLUS
INTRAVENOUS | Status: AC
  Filled 2020-09-07: qty 40

## 2020-09-07 MED ORDER — MIDAZOLAM HCL 2 MG/2ML IJ SOLN
INTRAMUSCULAR | Status: DC | PRN
  Administered 2020-09-07: 2 mg via INTRAVENOUS

## 2020-09-07 MED ORDER — MIDAZOLAM HCL 2 MG/2ML IJ SOLN
INTRAMUSCULAR | Status: AC
  Filled 2020-09-07: qty 2

## 2020-09-07 MED ORDER — HYDROMORPHONE HCL 1 MG/ML IJ SOLN
INTRAMUSCULAR | Status: AC
  Filled 2020-09-07: qty 1

## 2020-09-07 MED ORDER — LACTATED RINGERS IV SOLN
INTRAVENOUS | Status: DC
  Administered 2020-09-07: 1 mL via INTRAVENOUS

## 2020-09-07 MED ORDER — CEFAZOLIN SODIUM-DEXTROSE 2-4 GM/100ML-% IV SOLN
2.0000 g | INTRAVENOUS | Status: AC
  Administered 2020-09-07: 2 g via INTRAVENOUS

## 2020-09-07 MED ORDER — BUPIVACAINE HCL (PF) 0.5 % IJ SOLN
INTRAMUSCULAR | Status: DC | PRN
  Administered 2020-09-07: 20 mL

## 2020-09-07 MED ORDER — KETAMINE HCL 10 MG/ML IJ SOLN
INTRAMUSCULAR | Status: DC | PRN
  Administered 2020-09-07: 40 mg via INTRAVENOUS

## 2020-09-07 MED ORDER — BUPIVACAINE LIPOSOME 1.3 % IJ SUSP
INTRAMUSCULAR | Status: DC | PRN
  Administered 2020-09-07: 20 mL

## 2020-09-07 MED ORDER — FENTANYL CITRATE (PF) 250 MCG/5ML IJ SOLN
INTRAMUSCULAR | Status: AC
  Filled 2020-09-07: qty 5

## 2020-09-07 MED ORDER — HYDROMORPHONE HCL 1 MG/ML IJ SOLN
0.2500 mg | INTRAMUSCULAR | Status: DC | PRN
Start: 2020-09-07 — End: 2020-09-08
  Administered 2020-09-07 (×2): 0.5 mg via INTRAVENOUS

## 2020-09-07 MED ORDER — LIDOCAINE HCL (PF) 2 % IJ SOLN
INTRAMUSCULAR | Status: AC
  Filled 2020-09-07: qty 20

## 2020-09-07 MED ORDER — OXYCODONE HCL 5 MG/5ML PO SOLN: 5.0000 mg | Freq: Once | ORAL | Status: DC | PRN

## 2020-09-07 MED ORDER — DOCUSATE SODIUM 100 MG PO CAPS
100.0000 mg | ORAL_CAPSULE | Freq: Two times a day (BID) | ORAL | Status: DC
  Administered 2020-09-07: 100 mg via ORAL

## 2020-09-07 MED ORDER — EPHEDRINE 5 MG/ML INJ
INTRAVENOUS | Status: AC
  Filled 2020-09-07: qty 10

## 2020-09-07 MED ORDER — FENTANYL CITRATE (PF) 100 MCG/2ML IJ SOLN
INTRAMUSCULAR | Status: AC
  Filled 2020-09-07: qty 2

## 2020-09-07 MED ORDER — MEPERIDINE HCL 25 MG/ML IJ SOLN
6.2500 mg | INTRAMUSCULAR | Status: DC | PRN
Start: 2020-09-07 — End: 2020-09-08

## 2020-09-07 MED ORDER — CLONIDINE HCL (ANALGESIA) 100 MCG/ML EP SOLN
EPIDURAL | Status: DC | PRN
  Administered 2020-09-07 (×2): 50 ug

## 2020-09-07 MED ORDER — PROMETHAZINE HCL 25 MG/ML IJ SOLN: 6.2500 mg | INTRAMUSCULAR | Status: DC | PRN

## 2020-09-07 MED ORDER — KETOROLAC TROMETHAMINE 30 MG/ML IJ SOLN
30.0000 mg | Freq: Four times a day (QID) | INTRAMUSCULAR | Status: DC
  Administered 2020-09-07 – 2020-09-08 (×2): 30 mg via INTRAVENOUS

## 2020-09-07 MED ORDER — DEXAMETHASONE SODIUM PHOSPHATE 10 MG/ML IJ SOLN
INTRAMUSCULAR | Status: AC
  Filled 2020-09-07: qty 1

## 2020-09-07 MED ORDER — MENTHOL 3 MG MT LOZG
LOZENGE | OROMUCOSAL | Status: AC
  Filled 2020-09-07: qty 9

## 2020-09-07 MED ORDER — LIDOCAINE HCL (PF) 2 % IJ SOLN
INTRAMUSCULAR | Status: AC
  Filled 2020-09-07: qty 5

## 2020-09-07 MED ORDER — PROPOFOL 10 MG/ML IV BOLUS
INTRAVENOUS | Status: DC | PRN
  Administered 2020-09-07: 200 mg via INTRAVENOUS

## 2020-09-07 MED ORDER — ONDANSETRON HCL 4 MG/2ML IJ SOLN
INTRAMUSCULAR | Status: AC
  Filled 2020-09-07: qty 2

## 2020-09-07 MED ORDER — ROCURONIUM BROMIDE 10 MG/ML (PF) SYRINGE
PREFILLED_SYRINGE | INTRAVENOUS | Status: DC | PRN
  Administered 2020-09-07 (×2): 20 mg via INTRAVENOUS
  Administered 2020-09-07: 50 mg via INTRAVENOUS

## 2020-09-07 MED ORDER — BUPIVACAINE-EPINEPHRINE (PF) 0.5% -1:200000 IJ SOLN
INTRAMUSCULAR | Status: DC | PRN
  Administered 2020-09-07 (×2): 20 mL

## 2020-09-07 MED ORDER — ALBUMIN HUMAN 5 % IV SOLN: INTRAVENOUS | Status: DC | PRN

## 2020-09-07 MED ORDER — DOCUSATE SODIUM 100 MG PO CAPS
ORAL_CAPSULE | ORAL | Status: AC
  Filled 2020-09-07: qty 1

## 2020-09-07 MED ORDER — SIMETHICONE 80 MG PO CHEW: 80.0000 mg | CHEWABLE_TABLET | Freq: Four times a day (QID) | ORAL | Status: DC | PRN

## 2020-09-07 MED ORDER — MENTHOL 3 MG MT LOZG
1.0000 | LOZENGE | OROMUCOSAL | Status: DC | PRN
  Administered 2020-09-07: 3 mg via ORAL

## 2020-09-07 MED ORDER — KETOROLAC TROMETHAMINE 30 MG/ML IJ SOLN
INTRAMUSCULAR | Status: DC | PRN
  Administered 2020-09-07: 30 mg via INTRAVENOUS

## 2020-09-07 MED ORDER — ONDANSETRON HCL 4 MG/2ML IJ SOLN: 4.0000 mg | Freq: Four times a day (QID) | INTRAMUSCULAR | Status: DC | PRN

## 2020-09-07 MED ORDER — SUGAMMADEX SODIUM 200 MG/2ML IV SOLN
INTRAVENOUS | Status: DC | PRN
  Administered 2020-09-07: 200 mg via INTRAVENOUS

## 2020-09-07 SURGICAL SUPPLY — 40 items
CANISTER SUCT 3000ML PPV (MISCELLANEOUS) ×1 IMPLANT
COVER WAND RF STERILE (DRAPES) ×3 IMPLANT
DECANTER SPIKE VIAL GLASS SM (MISCELLANEOUS) IMPLANT
DRAPE UNDERBUTTOCKS STRL (DISPOSABLE) ×2 IMPLANT
DRAPE WARM FLUID 44X44 (DRAPES) ×1 IMPLANT
DRSG OPSITE POSTOP 4X10 (GAUZE/BANDAGES/DRESSINGS) ×3 IMPLANT
DURAPREP 26ML APPLICATOR (WOUND CARE) ×3 IMPLANT
ELECT BLADE TIP CTD 4 INCH (ELECTRODE) ×1 IMPLANT
GAUZE 4X4 16PLY RFD (DISPOSABLE) ×3 IMPLANT
GLOVE SURG ENC MOIS LTX SZ7 (GLOVE) ×3 IMPLANT
GLOVE SURG UNDER POLY LF SZ7 (GLOVE) ×9 IMPLANT
GOWN STRL REUS W/ TWL LRG LVL3 (GOWN DISPOSABLE) ×6 IMPLANT
GOWN STRL REUS W/TWL LRG LVL3 (GOWN DISPOSABLE) ×9
HEMOSTAT ARISTA ABSORB 3G PWDR (HEMOSTASIS) IMPLANT
HIBICLENS CHG 4% 4OZ (MISCELLANEOUS) ×3 IMPLANT
KIT TURNOVER CYSTO (KITS) ×3 IMPLANT
LIGASURE IMPACT 36 18CM CVD LR (INSTRUMENTS) ×1 IMPLANT
MANIFOLD NEPTUNE II (INSTRUMENTS) ×1 IMPLANT
NEEDLE HYPO 22GX1.5 SAFETY (NEEDLE) ×3 IMPLANT
PACK ABDOMINAL GYN (CUSTOM PROCEDURE TRAY) ×3 IMPLANT
PAD ARMBOARD 7.5X6 YLW CONV (MISCELLANEOUS) ×3 IMPLANT
PAD OB MATERNITY 4.3X12.25 (PERSONAL CARE ITEMS) ×3 IMPLANT
RTRCTR C-SECT PINK 25CM LRG (MISCELLANEOUS) ×1 IMPLANT
SET IRRIG Y TYPE TUR BLADDER L (SET/KITS/TRAYS/PACK) IMPLANT
SHEET LAVH (DRAPES) ×2 IMPLANT
SPONGE LAP 18X18 RF (DISPOSABLE) ×6 IMPLANT
SUT CHROMIC 2 0 CT 1 (SUTURE) IMPLANT
SUT PDS AB 0 CT 36 (SUTURE) IMPLANT
SUT PLAIN 2 0 (SUTURE)
SUT PLAIN 2 0 XLH (SUTURE) IMPLANT
SUT PLAIN ABS 2-0 54XMFL TIE (SUTURE) IMPLANT
SUT VIC AB 0 CT1 27 (SUTURE) ×15
SUT VIC AB 0 CT1 27XBRD ANBCTR (SUTURE) ×4 IMPLANT
SUT VIC AB 0 CT1 27XCR 8 STRN (SUTURE) ×6 IMPLANT
SUT VIC AB 2-0 CT1 (SUTURE) ×3 IMPLANT
SUT VIC AB 4-0 KS 27 (SUTURE) ×3 IMPLANT
SUT VICRYL 0 TIES 12 18 (SUTURE) ×3 IMPLANT
SYR CONTROL 10ML LL (SYRINGE) ×3 IMPLANT
TOWEL OR 17X26 10 PK STRL BLUE (TOWEL DISPOSABLE) ×6 IMPLANT
TRAY FOLEY W/BAG SLVR 14FR LF (SET/KITS/TRAYS/PACK) ×3 IMPLANT

## 2020-09-07 NOTE — H&P (Signed)
Alisha Coffey is an 45 y.o.  Presents for scheduled TAH/ BS due to symptomatic fibroid uterus manifested by menorrhagia and dysmenorrhea. Patient was referred to our office for progressive menorrhagia pad changes +1/hr, dysmenorrhea (heating helps when lying down), urinary frequency x 22m. Pt reports tranexamic acid 650mg  was helpful for bleeding. PT reports tender mass on R side x 36m. LMP 09/26/2019; longer than normal. Mirena IUD expulsion x 2 episodes; most recent April/May per pt. IUD used for menstrual cramping.  Pertinent Gynecological History: Menses: flow is excessive with use of 6 pads or tampons on heaviest days Bleeding: dysfunctional uterine bleeding Contraception: none DES exposure: denies Blood transfusions: none Sexually transmitted diseases: no past history Previous GYN Procedures: DNC  Last mammogram: normal Date: 2021 Last pap: normal Date: 10/07/2019 OB History: G3, P2   Menstrual History: Menarche age:66 No LMP recorded.    Past Medical History:  Diagnosis Date  . Acne   . Anxiety   . Chronic headache 2018   improved after treatment for anxiety 2018  . History of sinusitis   . Insomnia   . IUD (intrauterine device) in place    Mirena  . Joint pain   . Obesity   . Uterine fibroid     Past Surgical History:  Procedure Laterality Date  . BREAST REDUCTION SURGERY  2009, 2000  . WISDOM TOOTH EXTRACTION      Family History  Problem Relation Age of Onset  . Hypertension Mother   . Depression Maternal Grandmother   . Hypertension Maternal Grandmother   . Diabetes Maternal Grandfather   . Hypertension Maternal Grandfather   . Stroke Maternal Aunt   . Cancer Neg Hx   . Heart disease Neg Hx     Social History:  reports that she has never smoked. She has never used smokeless tobacco. She reports current alcohol use of about 1.0 standard drink of alcohol per week. She reports that she does not use drugs.  Allergies:  Allergies  Allergen Reactions  .  Belsomra [Suvorexant]     Didn't tolerate  . Propoxyphene Itching  . Valium [Diazepam] Nausea And Vomiting    Medications Prior to Admission  Medication Sig Dispense Refill Last Dose  . ALPRAZolam (XANAX) 1 MG tablet Take 1 mg by mouth daily as needed for anxiety.   09/07/2020 at 0915  . Ascorbic Acid (VITAMIN C) 1000 MG tablet Take 1,000 mg by mouth daily.     . Cyanocobalamin (B-12) 2500 MCG TABS Take 2,500 mcg by mouth daily.   09/06/2020 at Unknown time  . ferrous gluconate (FERGON) 324 MG tablet TAKE 1 TABLET BY MOUTH 2 TIMES DAILY WITH A MEAL. (Patient taking differently: Take 324 mg by mouth 2 (two) times daily with a meal.) 180 tablet 1 09/06/2020 at Unknown time  . Fluticasone-Salmeterol (ADVAIR) 100-50 MCG/DOSE AEPB Inhale 1 puff into the lungs in the morning and at bedtime. (Patient taking differently: Inhale 1 puff into the lungs daily as needed (asthma).) 60 each 1 09/06/2020 at Unknown time  . folic acid (FOLVITE) 540 MCG tablet Take 800 mcg by mouth daily.   09/06/2020 at Unknown time  . ibuprofen (ADVIL) 200 MG tablet Take 800 mg by mouth every 8 (eight) hours as needed for mild pain.   Past Month at Unknown time  . JUNEL FE 1.5/30 1.5-30 MG-MCG tablet Take 1 tablet by mouth daily.   09/06/2020 at Unknown time  . loratadine (CLARITIN) 10 MG tablet Take 10 mg by mouth daily  as needed for allergies.     . naproxen sodium (ALEVE) 220 MG tablet Take 220 mg by mouth 2 (two) times daily as needed (pain).   Past Month at Unknown time  . OVER THE COUNTER MEDICATION Take 30 mLs by mouth daily. Sea [place]   Past Week at Unknown time  . acetaminophen (TYLENOL) 500 MG tablet Take 1,000 mg by mouth every 8 (eight) hours as needed for moderate pain.   More than a month at Unknown time  . albuterol (VENTOLIN HFA) 108 (90 Base) MCG/ACT inhaler Inhale 2 puffs into the lungs every 6 (six) hours as needed for wheezing or shortness of breath. 18 g 0 More than a month at Unknown time  . Multiple Minerals-Vitamins  (CAL MAG ZINC +D3 PO) Take 1 tablet by mouth daily.   Unknown at Unknown time    Review of Systems  As per HPI  Blood pressure (!) 161/91, pulse 100, temperature 98.4 F (36.9 C), temperature source Oral, resp. rate 16, height 5\' 7"  (1.702 m), weight 88.5 kg, SpO2 100 %. Physical Exam Deferred to OR In office: broad uterus 18-20 weeks by palpation, no mobility.   Results for orders placed or performed during the hospital encounter of 09/07/20 (from the past 24 hour(s))  Pregnancy, urine POC     Status: None   Collection Time: 09/07/20 10:08 AM  Result Value Ref Range   Preg Test, Ur NEGATIVE NEGATIVE  Type and screen [facility]     Status: None (Preliminary result)   Collection Time: 09/07/20 10:30 AM  Result Value Ref Range   ABO/RH(D) PENDING    Antibody Screen PENDING    Sample Expiration      09/10/2020,2359 Performed at [facility], [place] [address], [place], [place] [address]   CBC     Status: Abnormal   Collection Time: 09/07/20 10:40 AM  Result Value Ref Range   WBC 4.3 4.0 - 10.5 K/uL   RBC 4.15 3.87 - 5.11 MIL/uL   Hemoglobin 10.6 (L) 12.0 - 15.0 g/dL   HCT 34.3 (L) 36.0 - 46.0 %   MCV 82.7 80.0 - 100.0 fL   MCH 25.5 (L) 26.0 - 34.0 pg   MCHC 30.9 30.0 - 36.0 g/dL   RDW 17.6 (H) 11.5 - 15.5 %   Platelets 376 150 - 400 K/uL   nRBC 0.0 0.0 - 0.2 %  Basic metabolic panel     Status: Abnormal   Collection Time: 09/07/20 10:40 AM  Result Value Ref Range   Sodium 135 135 - 145 mmol/L   Potassium 3.7 3.5 - 5.1 mmol/L   Chloride 107 98 - 111 mmol/L   CO2 19 (L) 22 - 32 mmol/L   Glucose, Bld 95 70 - 99 mg/dL   BUN 13 6 - 20 mg/dL   Creatinine, Ser 0.84 0.44 - 1.00 mg/dL   Calcium 8.8 (L) 8.9 - 10.3 mg/dL   GFR, Estimated >60 >60 mL/min   Anion gap 9 5 - 15    No results found.  Assessment/Plan: 45 year old with symptomatic fibroid uterus manifested by pain, bulk symptoms and abnormal uterine bleeding.   The risk versus  benefits of surgery were discussed with the patient to include, but not limited to bleeding possibly requiring a transfusion, infection, wound breakdown or poor healing, damage to organs in abdomen and pelvis w/ possible need for further surgical repair, need for an open abdominal incision to complete procedure, blood clots, PE/MI/stroke.  Additionally, risks versus benefits of ovarian preservation were discussed at length. She was counseled about the 1/70 lifetime risk of ovarian cancer and 5-10% risk for need of future surgery for ovarian pathology. Pt understands these risks and consents to surgery. She desires ovarian preservation at this time. Patient on call to OR for TAH/ Bilateral salpingectomy.  Ayeshia Coppin 09/07/2020, 11:42 AM

## 2020-09-07 NOTE — Op Note (Addendum)
OPERATIVE NOTE  Alisha Coffey  DOB:    [birth year]  MRN:    [id number]  CSN:    [id number]  Date of Surgery:  09/07/2020  Pre-Operative Diagnosis:Symptomatic fibroid uterus  Postoperative diagnoses: same as above  Procedure: Ex Lap abdominal myomectomy, Total abdominal hysterectomy,  bilateral salpingectomy   Surgeon: Dr. Sanjuana Kava, MD  Assistant: Earnstine Regal, PA  Specimen: fibroids; uterus with cervix, bilateral fallopian tubes  EBL: 400 mL  IVF: 1500 mL LR  UOP: 200 mL clear urine  Implants: Foley Catheter during case  Anesthesia:  General endotracheal; TAP block   Complications: none.  Operative findings: Very large fibroid uterus approximately 32 weeks size, Normal bilateral tubes and ovaries.   Technique:  After adequate general anesthesia was achieved, the patient was prepped and draped in the usual sterile fashion after the foley had been placed.  A Pfannenstiel incision was made with the scalpel and carried down to the fascia.  The fascia was incised with the scalpel in a transverse manner and extended in a transverse curvilinear manner.  The rectus muscles were split in the midline and a bowel free portion of the peritoneum was tented up.  It was entered into with the hemostat and extended in a superior and inferior manner with good visualization of the bowel and bladder. The Alexis retractor was placed in the abdomen.The uterus was too large to be brought out of the pelvis. Therefore the decision was made to debulk the uterus via a myomectomy in situ. The very large anteior fundal fibroid was identified. It was then injected with vasopressin, 40 units mixed in 100 cc of normal saline along the serosal surface and careful to aspirate to avoid any blood vessels. Approximately 20 cc was injected. Next, the Bovie was used to make a linear incision along the front of the uterine wall (as if to bivalve the uterus) along the top of the fibroid until fibroid fibers were  seen. The edges of the myometrium were bluntly dissected using surgeons fingers around the fibroid and it was also sequentially grasped with Lahey clamp to provide traction. Several smaller pieces of the fibroid were cut off and handed off the field.  Further blunt dissection was performed and the 14 cm large fibroid was dissected out using blunt and sharp dissection.  Once the fibroid was free and removed from the uterine bed, it was handed off to the scrub nurse to be sent to Pathology. The uterus could now be delivered out of the pelvis the bowel packed away with moist lap packs.   The right salpinx was transected off the uterus using the Ligasure impact. This was sent off the field to be sent to pathology. The right round ligament was then clamped, cauterized and transected with the Ligasure Impact.The bovie was then used to incise a transverse curvilinear incision in the vesico-cervical fascia. The Bladder flap was created and the bladder mobilized downward. The ureter was visualized retroperitoneally coursing well below the operative field. The incisions were extended to parallel the IP ligaments and the uterine arteries were skeletonized.  The mesosalpinx bilaterally was incised with the bovie cautery and removed from the ovaries.  The uterine-ovarian ligaments bilaterally were then clamped with heaney clamps and incised with the mayo scissors.  Each pedicle was then secured with a free tie and then a suture ligation.    The above was then done on the Left side, first transecting off the left fallopian tube with the Ligasure Impact, followed  by transection of the left round ligament. The uteroovarian ligament was transected and suture ligated with 0-vicryl. The bladder flap was then further developed and retracted inferiorly with blunt and cautery dissection below the external cervical os.  The ureter on the left side was also seen coursing well below the operative field. The uterine arteries were clamped  bilaterally with the heaneys and incised with the mayos.  Each pedicle was then secured with a stitch of 0-vicryl.  The uterine corpus was then transected off the cervix using the bovie and handed off the field.  The cervical edges were held with Kocher clamps and  the uterosacral cardinal ligament complex was sequentially clamped with straight heaney clamps transected with a scalpel and secured with 0-vicryl. This was done bilaterally.     At the level of the reflection of the vagina onto the cervix, two curved heaneys were placed and the cervix removed with the mayo scissory.  The cuff angles were then secured with 0-vicryl (Heaney stitch) and the cuff was closed with interrupted figure of eight stitches of 0-vicryl.  Hemostasis was insured and the ureters were peristalsing well out of the field of dissection.  Irrigation was performed and hemostasis was assured. Doylene Bode was placed along each pedicle and the vaginal cuff.   The instruments and laps were removed from the abdominal cavity and the peritoneum was closed with a running stitch of 2-chromci. Interrupted suture of 2-0 chromic re-approximated the rectus muscle.The fascia was closed with 0- vicryl in a running manner meeting from both ends in the middle. The subcutaneous layer was irrigated and 20cc of Marcaine diluted with 20 cc of Expiril was injected along all the abdominal layers.  The subcutaneous tissue was closed with interrupted stitches of 2-0 plain gut.    The skin was closed with 3-0 vicryl in subcuticular fashion with a keith needle. Sponge, needle and instrument counts were correct x 3.  Dermabond and a honeycomb bandage was placed over the incision.   Pt tolerated the procedure well and was transferred to the recovery room in good condition.    Attending Attestation: I PERFORMED THE PROCEDURE AND MY PHYSICIAN'S ASSISTANT WAS NEEDED FOR THE COMPLEXITY OF THE CASE   Sanjuana Kava

## 2020-09-07 NOTE — Anesthesia Procedure Notes (Signed)
Procedure Name: Intubation Date/Time: 09/07/2020 12:20 PM Performed by: Mechele Claude, CRNA Pre-anesthesia Checklist: Patient identified, Emergency Drugs available, Suction available and Patient being monitored Patient Re-evaluated:Patient Re-evaluated prior to induction Oxygen Delivery Method: Circle system utilized Preoxygenation: Pre-oxygenation with 100% oxygen Induction Type: IV induction Ventilation: Mask ventilation without difficulty Laryngoscope Size: Mac and 3 Grade View: Grade III Tube type: Oral Tube size: 7.0 mm Number of attempts: 1 Airway Equipment and Method: Stylet and Oral airway Placement Confirmation: positive ETCO2,  breath sounds checked- equal and bilateral and ETT inserted through vocal cords under direct vision Secured at: 22 cm Tube secured with: Tape Dental Injury: Teeth and Oropharynx as per pre-operative assessment  Comments: Able to visualize bottom of larynx only with max cricoid pressure.

## 2020-09-07 NOTE — Progress Notes (Signed)
Assisted Dr. Germeroth with right, left, ultrasound guided, transabdominal plane block. Side rails up, monitors on throughout procedure. See vital signs in flow sheet. Tolerated Procedure well. 

## 2020-09-07 NOTE — Transfer of Care (Signed)
Immediate Anesthesia Transfer of Care Note  Patient: Alisha Coffey  Procedure(s) Performed: Procedure(s) (LRB): HYSTERECTOMY ABDOMINAL (N/A) OPEN BILATERAL SALPINGECTOMY (Bilateral)  Patient Location: PACU  Anesthesia Type: General  Level of Consciousness: awake, alert  and oriented  Airway & Oxygen Therapy: Patient Spontanous Breathing and Patient connected to nasal cannula oxygen  Post-op Assessment: Report given to PACU RN and Post -op Vital signs reviewed and stable  Post vital signs: Reviewed and stable  Complications: No apparent anesthesia complications Last Vitals:  Vitals Value Taken Time  BP    Temp    Pulse    Resp    SpO2      Last Pain:  Vitals:   09/07/20 1034  TempSrc: Oral  PainSc: 0-No pain      Patients Stated Pain Goal: 4 (83/41/96 2229)  Complications: No complications documented.

## 2020-09-07 NOTE — Anesthesia Preprocedure Evaluation (Addendum)
Anesthesia Evaluation  Patient identified by MRN, date of birth, ID band Patient awake    Reviewed: Allergy & Precautions, NPO status , Patient's Chart, lab work & pertinent test results  Airway Mallampati: II  TM Distance: >3 FB Neck ROM: Full    Dental  (+) Dental Advisory Given, Teeth Intact   Pulmonary asthma ,    Pulmonary exam normal breath sounds clear to auscultation       Cardiovascular negative cardio ROS Normal cardiovascular exam Rhythm:Regular Rate:Normal     Neuro/Psych  Headaches, PSYCHIATRIC DISORDERS Anxiety    GI/Hepatic negative GI ROS, Neg liver ROS,   Endo/Other  negative endocrine ROS  Renal/GU negative Renal ROS     Musculoskeletal negative musculoskeletal ROS (+)   Abdominal (+) + obese,   Peds  Hematology  (+) Blood dyscrasia, anemia ,   Anesthesia Other Findings   Reproductive/Obstetrics                            Anesthesia Physical Anesthesia Plan  ASA: II  Anesthesia Plan: General   Post-op Pain Management: GA combined w/ Regional for post-op pain   Induction: Intravenous  PONV Risk Score and Plan: Treatment may vary due to age or medical condition, Midazolam, Ondansetron, Dexamethasone and Scopolamine patch - Pre-op  Airway Management Planned: Oral ETT  Additional Equipment:   Intra-op Plan:   Post-operative Plan: Extubation in OR  Informed Consent: I have reviewed the patients History and Physical, chart, labs and discussed the procedure including the risks, benefits and alternatives for the proposed anesthesia with the patient or authorized representative who has indicated his/her understanding and acceptance.     Dental advisory given  Plan Discussed with: CRNA  Anesthesia Plan Comments:        Anesthesia Quick Evaluation

## 2020-09-07 NOTE — Anesthesia Procedure Notes (Signed)
Anesthesia Regional Block: TAP block   Pre-Anesthetic Checklist: ,, timeout performed, Correct Patient, Correct Site, Correct Laterality, Correct Procedure, Correct Position, site marked, Risks and benefits discussed,  Surgical consent,  Pre-op evaluation,  At surgeon's request and post-op pain management  Laterality: Left and Right  Prep: chloraprep       Needles:   Needle Type: Stimiplex     Needle Length: 9cm      Additional Needles:   Procedures:,,,, ultrasound used (permanent image in chart),,,,  Narrative:  Start time: 09/07/2020 11:50 AM End time: 09/07/2020 12:05 PM Injection made incrementally with aspirations every 5 mL.  Performed by: Personally  Anesthesiologist: Nolon Nations, MD  Additional Notes: Patient tolerated well. Good fascial spread noted.

## 2020-09-08 ENCOUNTER — Encounter (HOSPITAL_BASED_OUTPATIENT_CLINIC_OR_DEPARTMENT_OTHER): Payer: Self-pay | Admitting: Obstetrics & Gynecology

## 2020-09-08 DIAGNOSIS — D259 Leiomyoma of uterus, unspecified: Secondary | ICD-10-CM | POA: Diagnosis not present

## 2020-09-08 LAB — BASIC METABOLIC PANEL
Anion gap: 13 (ref 5–15)
BUN: 11 mg/dL (ref 6–20)
CO2: 17 mmol/L — ABNORMAL LOW (ref 22–32)
Calcium: 8.8 mg/dL — ABNORMAL LOW (ref 8.9–10.3)
Chloride: 104 mmol/L (ref 98–111)
Creatinine, Ser: 0.88 mg/dL (ref 0.44–1.00)
GFR, Estimated: >60 mL/min (ref >60)
Glucose, Bld: 177 mg/dL — ABNORMAL HIGH (ref 70–99)
Potassium: 4.1 mmol/L (ref 3.5–5.1)
Sodium: 134 mmol/L — ABNORMAL LOW (ref 135–145)

## 2020-09-08 LAB — CBC
HCT: 31.1 % — ABNORMAL LOW (ref 36.0–46.0)
Hemoglobin: 9.4 g/dL — ABNORMAL LOW (ref 12.0–15.0)
MCH: 26 pg (ref 26.0–34.0)
MCHC: 30.2 g/dL (ref 30.0–36.0)
MCV: 86.1 fL (ref 80.0–100.0)
Platelets: 313 10*3/uL (ref 150–400)
RBC: 3.61 MIL/uL — ABNORMAL LOW (ref 3.87–5.11)
RDW: 17.4 % — ABNORMAL HIGH (ref 11.5–15.5)
WBC: 10.5 10*3/uL (ref 4.0–10.5)
nRBC: 0 % (ref 0.0–0.2)

## 2020-09-08 MED ORDER — FERROUS GLUCONATE 324 (38 FE) MG PO TABS: ORAL_TABLET | ORAL | 1 refills | Status: DC

## 2020-09-08 MED ORDER — OXYCODONE-ACETAMINOPHEN 5-325 MG PO TABS
ORAL_TABLET | ORAL | Status: AC
  Filled 2020-09-08: qty 2

## 2020-09-08 MED ORDER — IBUPROFEN 600 MG PO TABS: ORAL_TABLET | ORAL | 1 refills | Status: DC

## 2020-09-08 MED ORDER — OXYCODONE-ACETAMINOPHEN 5-325 MG PO TABS: ORAL_TABLET | ORAL | 0 refills | Status: DC

## 2020-09-08 MED ORDER — KETOROLAC TROMETHAMINE 30 MG/ML IJ SOLN
INTRAMUSCULAR | Status: AC
  Filled 2020-09-08: qty 1

## 2020-09-08 NOTE — Progress Notes (Signed)
Alisha Coffey is a23 y.o.  [id number]  Post Op Date # 1:  TAH/BS  Subjective: Patient is Doing well postoperatively. Patient has Pain is controlled with current analgesics. Medications being used: prescription NSAID's including Ketorolac 30 mg and narcotic analgesics including Percocet 5/325 mg., Tolerating a regular diet, ambulating in the halls without dizziness and voiding without difficulty.    Objective: Vital signs in last 24 hours: Temp:  [97.7 F (36.5 C)-99.1 F (37.3 C)] 98.9 F (37.2 C) (01/11 0629) Pulse Rate:  [81-100] 94 (01/11 0629) Resp:  [12-23] 18 (01/11 0629) BP: (115-161)/(65-106) 127/65 (01/11 0629) SpO2:  [97 %-100 %] 99 % (01/11 0629) Weight:  [88.5 kg] 88.5 kg (01/10 1034)  Intake/Output from previous day: 01/10 0701 - 01/11 0700 In: 2390 [P.O.:240; I.V.:1800] Out: 2075 [Urine:1675] Intake/Output this shift: No intake/output data recorded. Recent Labs  Lab 09/07/20 1040 09/08/20 0420  WBC 4.3 10.5  HGB 10.6* 9.4*  HCT 34.3* 31.1*  PLT 376 313     Recent Labs  Lab 09/07/20 1040 09/08/20 0420  NA 135 134*  K 3.7 4.1  CL 107 104  CO2 19* 17*  BUN 13 11  CREATININE 0.84 0.88  CALCIUM 8.8* 8.8*  GLUCOSE 95 177*    EXAM: General: alert, cooperative and no distress Resp: Bilateral wheezes at bases with no rhonchi or rales (patient states she has not been using inhaler but has used spirometer) Cardio: regular rate and rhythm, S1, S2 normal, no murmur, click, rub or gallop GI: bowel sounds present, soft with dressing that is clean/dry/intact Extremities: Homans sign is negative, no sign of DVT and no calf tenderness   Assessment: s/p Procedure(s): HYSTERECTOMY ABDOMINAL OPEN BILATERAL SALPINGECTOMY: stable, progressing well, tolerating diet and anemia  Plan: Discharge home  Resume inhaler and other asthma medications as directed Resume iron supplementation twice daily every other day  LOS: 0 days    Earnstine Regal, PA-C 09/08/2020  7:36 AM

## 2020-09-08 NOTE — Discharge Summary (Signed)
Physician Discharge Summary  Patient ID: Alisha Coffey MRN: [id number] DOB/AGE: 1975/11/22 45 y.o.  Admit date: 09/07/2020 Discharge date: 09/08/2020   Discharge Diagnoses:  Active Problems:   Fibroids   Fibroid, uterine Menorrhagia Pelvic Pain  Operation: Total Abdominal Hysterectomy with Bilateral Salpingectomy   Discharged Condition: Good  Hospital Course: On the date of admission the patient underwent the aforementioned procedures and tolerated them well.  Post operative course was unremarkable with the patient resuming bowel and bladder function by post operative day #1 and was therefore deemed ready for discharge home.  Discharge hemoglobin was 9.4.  Disposition: Discharge disposition: 01-Home or Self Care       Discharge Medications:  Allergies as of 09/08/2020      Reactions   Belsomra [suvorexant]    Didn't tolerate   Propoxyphene Itching   Valium [diazepam] Nausea And Vomiting      Medication List    STOP taking these medications   acetaminophen 500 MG tablet Commonly known as: TYLENOL   Junel FE 1.5/30 1.5-30 MG-MCG tablet Generic drug: norethindrone-ethinyl estradiol-iron   naproxen sodium 220 MG tablet Commonly known as: ALEVE     TAKE these medications   albuterol 108 (90 Base) MCG/ACT inhaler Commonly known as: VENTOLIN HFA Inhale 2 puffs into the lungs every 6 (six) hours as needed for wheezing or shortness of breath.   ALPRAZolam 1 MG tablet Commonly known as: XANAX Take 1 mg by mouth daily as needed for anxiety.   B-12 2500 MCG Tabs Take 2,500 mcg by mouth daily.   CAL MAG ZINC +D3 PO Take 1 tablet by mouth daily.   ferrous gluconate 324 MG tablet Commonly known as: FERGON take 1 tablet po bid every other day What changed: See the new instructions.   Fluticasone-Salmeterol 100-50 MCG/DOSE Aepb Commonly known as: ADVAIR Inhale 1 puff into the lungs in the morning and at bedtime. What changed:   when to take this  reasons to  take this   folic acid 917 MCG tablet Commonly known as: FOLVITE Take 800 mcg by mouth daily.   ibuprofen 600 MG tablet Commonly known as: ADVIL take 1 tablet po pc every 6 hours for 5 days then prn-post operative pain What changed:   medication strength  how much to take  how to take this  when to take this  reasons to take this  additional instructions   loratadine 10 MG tablet Commonly known as: CLARITIN Take 10 mg by mouth daily as needed for allergies.   OVER THE COUNTER MEDICATION Take 30 mLs by mouth daily. [place]   oxyCODONE-acetaminophen 5-325 MG tablet Commonly known as: PERCOCET/ROXICET take 1-2  tablet(s)  po every 6 hours as needed for breakthrough post operative pain   vitamin C 1000 MG tablet Take 1,000 mg by mouth daily.          Follow-up: Dr. Sanjuana Kava on September 14, 2020 at 11 a.m. and October 01, 2020 at 11 a.m.    Signed: Earnstine Regal, PA-C 09/08/2020, 7:48 AM

## 2020-09-08 NOTE — Discharge Instructions (Signed)
Abdominal Hysterectomy, Care After The following information offers guidance on how to care for yourself after your procedure. Your doctor may also give you more specific instructions. If you have problems or questions, contact your doctor. What can I expect after the procedure? After the procedure, it is common to have:  Pain.  Tiredness.  No desire to eat.  Less interest in sex.  Bleeding and fluid (discharge) from your vagina. You may need to use a pad after this procedure.  Trouble pooping (constipation).  Feelings of sadness or other emotions. Follow these instructions at home: Medicines  Take over-the-counter and prescription medicines only as told by your doctor.  Do not take aspirin or NSAIDs, such as ibuprofen. These medicines can cause bleeding.  If you were prescribed an antibiotic medicine, take it as told by your doctor. Do not stop using the antibiotic even if you start to feel better.  If told, take steps to prevent problems with pooping (constipation). You may need to: ? Drink enough fluid to keep your pee (urine) pale yellow. ? Take medicines. You will be told what medicines to take. ? Eat foods that are high in fiber. These include beans, whole grains, and fresh fruits and vegetables. ? Limit foods that are high in fat and sugar. These include fried or sweet foods.  Ask your doctor if you should avoid driving or using machines while you are taking your medicine. Surgical cut care  Follow instructions from your doctor about how to take care of your cut from surgery (incision). Make sure you: ? Wash your hands with soap and water for at least 20 seconds before and after you change your bandage. If you cannot use soap and water, use hand sanitizer. ? Change your bandage. ? Leave stitches or skin glue in place for at least two weeks. ? Leave tape strips alone unless you are told to take them off. You may trim the edges of the tape strips if they curl up.  Keep  the bandage dry until your doctor says it can be taken off.  Check your incision every day for signs of infection. Check for: ? More redness, swelling, or pain. ? Fluid or blood. ? Warmth. ? Pus or a bad smell.   Activity  Rest as told by your doctor.  Get up to take short walks every 1 to 2 hours. Ask for help if you feel weak or unsteady.  Do not lift anything that is heavier than 10 lb (4.5 kg), or the limit that you are told.  Follow your doctor's advice about exercise, driving, and general activities.  Return to your normal activities when your doctor says that it is safe.   Lifestyle  Do not douche, use tampons, or have sex for at least 6 weeks or as told by your doctor.  Do not drink alcohol until your doctor says it is okay.  Do not smoke or use any products that contain nicotine or tobacco. These can delay healing after surgery. If you need help quitting, ask your doctor. General instructions  Do not take baths, swim, or use a hot tub. Ask your doctor about taking showers or sponge baths.  Try to have a responsible adult at home with you for the first 1-2 weeks to help with your daily chores.  Wear tight-fitting (compression) stockings as told by your doctor.  Keep all follow-up visits. Contact a doctor if:  You have chills or a fever.  You have any of these signs  of infection around your cut: ? More redness, swelling, or pain. ? Fluid or blood. ? Warmth. ? Pus or a bad smell.  Your cut breaks open.  You feel dizzy or light-headed.  You have pain or bleeding when you pee.  You keep having watery poop (diarrhea).  You keep feeling like you may vomit or you keep vomiting.  You have fluid coming from your vagina that is not normal.  You have any type of reaction to your medicine that is not normal, like a rash, or you develop an allergy to your medicine.  Your pain medicine does not help. Get help right away if:  You have a fever and your symptoms  get worse suddenly.  You have very bad pain in your belly (abdomen).  You are short of breath.  You faint.  You have pain, swelling, or redness of your leg.  You bleed a lot from your vagina and you see blood clots. Summary  It is normal to have some pain, tiredness, and fluid that comes from your vagina.  Do not take baths, swim, or use a hot tub. Ask your doctor about taking showers or sponge baths.  Do not lift anything that is heavier than 10 lb (4.5 kg), or the limit that you are told.  Follow your doctor's advice about exercise, driving, and general activities.  Try to have a responsible adult at home with you for the first 1-2 weeks to help with your daily chores. This information is not intended to replace advice given to you by your health care provider. Make sure you discuss any questions you have with your health care provider. Document Revised: 04/16/2020 Document Reviewed: 04/16/2020 Elsevier Patient Education  2021 [facility] @ [phone] if:  You have a temperature greater than or equal to 100.4 degrees Farenheit orally You have pain that is not made better by the pain medication given and taken as directed You have excessive bleeding or problems urinating  Take Colace (Docusate Sodium/Stool Softener) 100 mg 2-3 times daily while taking narcotic pain medicine to avoid constipation or until bowel movements are regular. Take Ibuprofen 600 mg with food every 6 hours for 5 days then as needed for pain  Take iron supplements twice a day every other day  You may drive after 2 weeks You may walk up steps  You may shower  You may resume a regular diet  Keep incisions clean and dry; remove honeycomb dressing on September 13, 2020 Do not lift over 15 pounds for 6 weeks Avoid anything in vagina for 6 weeks (or until after your post-operative visit)

## 2020-09-09 LAB — SURGICAL PATHOLOGY

## 2020-09-21 NOTE — Anesthesia Postprocedure Evaluation (Signed)
Anesthesia Post Note  Patient: Alisha Coffey  Procedure(s) Performed: HYSTERECTOMY ABDOMINAL (N/A ) OPEN BILATERAL SALPINGECTOMY (Bilateral )     Patient location during evaluation: PACU Anesthesia Type: General Level of consciousness: sedated and patient cooperative Pain management: pain level controlled Vital Signs Assessment: post-procedure vital signs reviewed and stable Respiratory status: spontaneous breathing Cardiovascular status: stable Anesthetic complications: no   No complications documented.  Last Vitals:  Vitals:   09/08/20 0629 09/08/20 0841  BP: 127/65 125/77  Pulse: 94 84  Resp: 18 18  Temp: 37.2 C 37.3 C  SpO2: 99% 97%    Last Pain:  Vitals:   09/08/20 0500  TempSrc:   PainSc: [place]

## 2020-10-14 ENCOUNTER — Ambulatory Visit: Payer: BC Managed Care – PPO | Admitting: Medical

## 2020-10-27 ENCOUNTER — Ambulatory Visit: Payer: BC Managed Care – PPO | Admitting: Medical

## 2020-10-29 ENCOUNTER — Other Ambulatory Visit: Payer: Self-pay | Admitting: Obstetrics & Gynecology

## 2020-10-30 ENCOUNTER — Other Ambulatory Visit: Payer: Self-pay | Admitting: Obstetrics & Gynecology

## 2020-10-30 DIAGNOSIS — G8918 Other acute postprocedural pain: Secondary | ICD-10-CM

## 2020-11-02 ENCOUNTER — Ambulatory Visit: Payer: BC Managed Care – PPO | Admitting: Family Medicine

## 2020-11-16 ENCOUNTER — Ambulatory Visit
Admission: RE | Admit: 2020-11-16 | Discharge: 2020-11-16 | Disposition: A | Discharge status: Home / Self Care | Payer: BC Managed Care – PPO | Source: Ambulatory Visit | Attending: Obstetrics & Gynecology | Admitting: Obstetrics & Gynecology

## 2020-11-16 ENCOUNTER — Other Ambulatory Visit: Payer: Self-pay

## 2020-11-16 DIAGNOSIS — G8918 Other acute postprocedural pain: Secondary | ICD-10-CM

## 2020-11-16 MED ORDER — IOPAMIDOL (ISOVUE-300) INJECTION 61%
100.0000 mL | Freq: Once | INTRAVENOUS | Status: AC | PRN
  Administered 2020-11-16: 100 mL via INTRAVENOUS

## 2020-11-23 ENCOUNTER — Ambulatory Visit: Payer: Self-pay | Admitting: Surgery

## 2020-11-23 NOTE — H&P (Signed)
Alisha Coffey Appointment: 11/23/2020 10:10 AM Location: [facility] Patient #: [id number] DOB: 01-01-1976 Single / Language: Alisha Coffey / Race: Black or African American Female  History of Present Illness Marcello Moores A. Parthiv Mucci MD; 11/23/2020 10:46 AM) Patient words: Patient presents at the request of Dr. Alwyn Pea for evaluation of ventral hernia. She recently underwent a hysterectomy for uterine fibroids. She was noted to have a bulge just to the left and just superior to her umbilicus. Computed tomography scan showed a small ventral hernia with what appears to be preperitoneal fat in it. It does cause discomfort. She also has some vague abdominal complaints of bloating and occasional nausea but no vomiting. Her bowels are moving. She had developed issues with her legs secondary to the large uterus compressing her pelvic structures leading to what sounds like some neuropathy that is getting better with physical therapy. Hernia states soft and is mildly tender to palpation.  The patient is a 45 year old female.   Diagnostic Studies History Hortencia Conradi, CMA; 11/23/2020 10:11 AM) Colonoscopy never Mammogram 1-3 years ago Pap Smear 1-5 years ago  Allergies (Kheana Marshall-McBride, CMA; 11/23/2020 10:12 AM) Darvocet A500 *ANALGESICS - OPIOID* traMADol HCl *ANALGESICS - OPIOID* Allergies Reconciled  Medication History (Kheana Marshall-McBride, CMA; 11/23/2020 10:14 AM) Gabapentin (300MG  Capsule, Oral) Active. Ibuprofen (600MG  Tablet, Oral) Active. Ferrous Gluconate (324 (38 Fe)MG Tablet, Oral) Active. ALPRAZolam (1MG  Tablet, Oral) Active. Loratadine (10MG  Tablet, Oral) Active. Cyanocobalamin (2500MCG Tab Sublingual, Sublingual) Active. Medications Reconciled  Social History Hortencia Conradi, CMA; 11/23/2020 10:11 AM) Alcohol use Occasional alcohol use. Caffeine use Coffee, Tea. No drug use Tobacco use Never smoker.  Family History Hortencia Conradi, CMA; 11/23/2020 10:11 AM) First Degree Relatives No pertinent family history  Pregnancy / Birth History Hortencia Conradi, CMA; 11/23/2020 10:11 AM) Age at menarche 58 years. Contraceptive History Oral contraceptives. Gravida 2 Irregular periods Maternal age 107-20 Para 2  Other Problems Hortencia Conradi, CMA; 11/23/2020 10:11 AM) Anxiety Disorder Arthritis Back Pain Depression Migraine Headache     Review of Systems (Kheana Marshall-McBride CMA; 11/23/2020 10:11 AM) General Present- Appetite Loss, Fatigue and Night Sweats. Not Present- Chills, Fever, Weight Gain and Weight Loss. Skin Present- Dryness. Not Present- Change in Wart/Mole, Hives, Jaundice, New Lesions, Non-Healing Wounds, Rash and Ulcer. HEENT Present- Seasonal Allergies. Not Present- Earache, Hearing Loss, Hoarseness, Nose Bleed, Oral Ulcers, Ringing in the Ears, Sinus Pain, Sore Throat, Visual Disturbances, Wears glasses/contact lenses and Yellow Eyes. Respiratory Present- Snoring. Not Present- Bloody sputum, Chronic Cough, Difficulty Breathing and Wheezing. Breast Not Present- Breast Mass, Breast Pain, Nipple Discharge and Skin Changes. Cardiovascular Present- Leg Cramps. Not Present- Chest Pain, Difficulty Breathing Lying Down, Palpitations, Rapid Heart Rate, Shortness of Breath and Swelling of Extremities. Gastrointestinal Present- Abdominal Pain. Not Present- Bloating, Bloody Stool, Change in Bowel Habits, Chronic diarrhea, Constipation, Difficulty Swallowing, Excessive gas, Gets full quickly at meals, Hemorrhoids, Indigestion, Nausea, Rectal Pain and Vomiting. Female Genitourinary Not Present- Frequency, Nocturia, Painful Urination, Pelvic Pain and Urgency. Musculoskeletal Present- Back Pain. Not Present- Joint Pain, Joint Stiffness, Muscle Pain, Muscle Weakness and Swelling of Extremities. Neurological Present- Tingling. Not Present- Decreased Memory, Fainting, Headaches,  Numbness, Seizures, Tremor, Trouble walking and Weakness. Psychiatric Present- Anxiety. Not Present- Bipolar, Change in Sleep Pattern, Depression, Fearful and Frequent crying. Endocrine Present- Hair Changes. Not Present- Cold Intolerance, Excessive Hunger, Heat Intolerance, Hot flashes and New Diabetes. Hematology Present- Easy Bruising. Not Present- Blood Thinners, Excessive bleeding, Gland problems, HIV and Persistent Infections.  Vitals Sherron Ales Marshall-McBride CMA; 11/23/2020  10:16 AM) 11/23/2020 10:14 AM Weight: 196.25 lb Height: 66in Body Surface Area: 1.98 m Body Mass Index: 31.68 kg/m  Temp.: 97.67F  Pulse: 102 (Regular)  BP: 122/68(Sitting, Left Arm, Standard)        Physical Exam (Tyvon Eggenberger A. Seaver Machia MD; 11/23/2020 10:48 AM)  General Mental Status-Alert. General Appearance-Consistent with stated age. Hydration-Well hydrated. Voice-Normal.  Head and Neck Head-normocephalic, atraumatic with no lesions or palpable masses. Trachea-midline. Thyroid Gland Characteristics - normal size and consistency.  Chest and Lung Exam Chest and lung exam reveals -quiet, even and easy respiratory effort with no use of accessory muscles and on auscultation, normal breath sounds, no adventitious sounds and normal vocal resonance. Inspection Chest Wall - Normal. Back - normal.  Cardiovascular Cardiovascular examination reveals -normal heart sounds, regular rate and rhythm with no murmurs and normal pedal pulses bilaterally.  Abdomen Note: Just left and 2 fingerbreadths superior to the umbilicus is a reducible ventral hernia which appears to be 1-2 cm size. Lower abdominal postsurgical changes noted. Soft nontender without rebound or guarding.  Neurologic Neurologic evaluation reveals -alert and oriented x 3 with no impairment of recent or remote memory. Mental Status-Normal.  Musculoskeletal Normal Exam - Left-Upper Extremity Strength Normal and  Lower Extremity Strength Normal. Normal Exam - Right-Upper Extremity Strength Normal and Lower Extremity Strength Normal.    Assessment & Plan (Scarlet Abad A. Jalesia Loudenslager MD; 11/23/2020 10:48 AM)  VENTRAL HERNIA WITHOUT OBSTRUCTION OR GANGRENE (K43.9) Impression: Discussed surgical repair in the use of mesh. Risks and benefits discussed with the patient as well as long-term expectations, complications and recovery. The risk of hernia repair include bleeding, infection, organ injury, bowel injury, bladder injury, nerve injury recurrent hernia, blood clots, worsening of underlying condition, chronic pain, mesh use, open surgery, death, and the need for other operations. Pt agrees to proceed  Current Plans You are being scheduled for surgery- Our schedulers will call you.  You should hear from our office's scheduling department within 5 working days about the location, date, and time of surgery. We try to make accommodations for patient's preferences in scheduling surgery, but sometimes the OR schedule or the surgeon's schedule prevents [place] from making those accommodations.  If you have not heard from our office [phone]) in 5 working days, call the office and ask for your surgeon's nurse.  If you have other questions about your diagnosis, plan, or surgery, call the office and ask for your surgeon's nurse.  Pt Education - Pamphlet Given - Hernia Surgery: discussed with patient and provided information. Pt Education - CCS Mesh education: discussed with patient and provided information. CCS Consent - Hernia Repair - Ventral/Incisional/Umbilical : discussed with patient and provided information.

## 2020-11-26 ENCOUNTER — Encounter: Payer: Self-pay | Admitting: Family Medicine

## 2020-11-26 ENCOUNTER — Ambulatory Visit (INDEPENDENT_AMBULATORY_CARE_PROVIDER_SITE_OTHER): Payer: BC Managed Care – PPO | Admitting: Family Medicine

## 2020-11-26 ENCOUNTER — Other Ambulatory Visit: Payer: Self-pay

## 2020-11-26 VITALS — BP 178/98 | HR 81 | Temp 97.3°F | Wt 192.2 lb

## 2020-11-26 DIAGNOSIS — R030 Elevated blood-pressure reading, without diagnosis of hypertension: Secondary | ICD-10-CM

## 2020-11-26 DIAGNOSIS — B370 Candidal stomatitis: Secondary | ICD-10-CM | POA: Diagnosis not present

## 2020-11-26 DIAGNOSIS — Z131 Encounter for screening for diabetes mellitus: Secondary | ICD-10-CM | POA: Diagnosis not present

## 2020-11-26 LAB — POCT CBG (FASTING - GLUCOSE)-MANUAL ENTRY: Glucose Fasting, POC: 152 mg/dL — AB (ref 70–99)

## 2020-11-26 LAB — POCT GLYCOSYLATED HEMOGLOBIN (HGB A1C): Hemoglobin A1C: 6.5 % — AB (ref 4.0–5.6)

## 2020-11-26 MED ORDER — FLUCONAZOLE 150 MG PO TABS: 150.0000 mg | ORAL_TABLET | Freq: Once | ORAL | 0 refills | Status: AC

## 2020-11-26 NOTE — Progress Notes (Signed)
   Subjective:    Patient ID: Alisha Coffey, female    DOB: 1976/05/04, 45 y.o.   MRN: [id number]  HPI She is here for consult concerning possible thrush.  She states that she has whitish deposits in her mouth and on her tongue.  There is no burning or stinging.  Presently she is on short-term disability due to recent gynecologic surgery. Her last meal was last night.  No previous history of trouble with this or diabetes.  Review of Systems     Objective:   Physical Exam Alert and in no distress.  Exam of the mouth does show the tongue to be whitish as well as some whitet lesions on the oral mucosa. KOH was questionably positive for yeast.  Fasting blood sugar 152.  Hemoglobin A1c 6.5.      Assessment & Plan:  Thrush - Plan: fluconazole (DIFLUCAN) 150 MG tablet  Screening for diabetes mellitus - Plan: POCT glycosylated hemoglobin (Hb A1C), Glucose (CBG), Fasting  Elevated blood pressure reading I will treat her for thrush and give her an extra pill just to be safe.  Hopefully this will take care of it. Her blood pressure is elevated but this is the first abnormal reading that high and she will need follow-up in approximately 1 month. I will discussed the diagnosis of diabetes and since she is borderline did recommend possibly rechecking this at a later date to verify this.  Discussed diet and exercise with her.  Follow-up here in roughly 1 month.

## 2020-12-06 ENCOUNTER — Other Ambulatory Visit: Payer: Self-pay | Admitting: Family Medicine

## 2020-12-07 NOTE — Telephone Encounter (Signed)
[facility] to [facility]air. Please advise [facility]

## 2020-12-09 ENCOUNTER — Telehealth: Payer: Self-pay | Admitting: Family Medicine

## 2020-12-09 ENCOUNTER — Other Ambulatory Visit: Payer: Self-pay | Admitting: Medical

## 2020-12-09 MED ORDER — FLUTICASONE-SALMETEROL 100-50 MCG/DOSE IN AEPB: 1.0000 | INHALATION_SPRAY | Freq: Every day | RESPIRATORY_TRACT | 0 refills | Status: DC | PRN

## 2020-12-09 NOTE — Telephone Encounter (Signed)
[facility] requesting 90 day supply for Advair as insurance will only pay for 90 days supply

## 2020-12-17 NOTE — Progress Notes (Signed)
Date:  12/18/2020   ID:  Alisha Coffey, DOB March 07, 1976, MRN [id number]  PCP:  Carlena Hurl, PA-C  Cardiologist: Rex Kras, DO, [facility] (established care 12/18/2020) Former Cardiology Providers: Jeri Lager, APRN, FNP-C  REASON FOR CONSULT: Hypertension  REQUESTING PHYSICIAN:  Sanjuana Kava, MD [place],  [place] [address]  Chief Complaint  Patient presents with  . Hypertension  . New Patient (Initial Visit)    HPI  Alisha Coffey is a 45 y.o. African-American female who presents to the office with a chief complaint of " blood pressure management." Patient's past medical history and cardiovascular risk factors include: Anxiety, depression, chronic migraines, marijuana use, diabetes, hypertension, obesity due to excess calorie.   She is referred to the office at the request of Sanjuana Kava, MD for evaluation of hypertension.  Patient was last seen in the office in 2019 and then failed to follow-up.  Now referred to the office for management of hypertension.  Since January 2022 she is undergone GYN surgery for fibroids and then later developed a hernia and underwent surgery on 12/10/2020.  Patient states that she went in for a noncardiac symptom to her PCPs office on 11/27/2020 and was informed that her blood pressure is elevated.  Patient recalls her blood pressure to be around 170/90 mmHg.  She then followed up with her OB/GYN and tried lifestyle modifications but no significant improvement.  She got a blood pressure cuff and she was checking it several times in a day and she had her blood pressure was labile.  She was started on hydrochlorothiazide 25 mg p.o. daily and her blood pressures improved to around 160 mmHg.  On December 09, 2020 she was started on triamterene/hydrochlorothiazide by her OB/GYN and after that her blood pressure has improved to 145/108.Marland Kitchen  She now is referred to cardiology for evaluation and management.  Given the recent surgery patient  states that her pain is well controlled.  She uses Motrin 800 mg p.o. 3 times daily on as needed basis last use was 4 days ago.  Patient states that she has a lot of stress regarding work and life balance.  She denies any use of canned goods but eats high caloric foods which are fried, frequent takeout meals, muffins cookies, and fries.  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  No structured exercise program or daily routine.  FUNCTIONAL STATUS: No structured exercise program or daily routine.   ALLERGIES: Allergies  Allergen Reactions  . Belsomra [Suvorexant]     Didn't tolerate  . Propoxyphene Itching  . Valium [Diazepam] Nausea And Vomiting    MEDICATION LIST PRIOR TO VISIT: Current Meds  Medication Sig  . albuterol (VENTOLIN HFA) 108 (90 Base) MCG/ACT inhaler Inhale 2 puffs into the lungs every 6 (six) hours as needed for wheezing or shortness of breath.  . ALPRAZolam (XANAX) 1 MG tablet Take 1 mg by mouth daily as needed for anxiety.  . Ascorbic Acid (VITAMIN C) 1000 MG tablet Take 1,000 mg by mouth daily.  . Cyanocobalamin (B-12) 2500 MCG TABS Take 2,500 mcg by mouth daily.  Marland Kitchen doxycycline (VIBRA-TABS) 100 MG tablet Take 100 mg by mouth 2 (two) times daily as needed.  . ferrous gluconate (FERGON) 324 MG tablet take 1 tablet po bid every other day  . Fluticasone-Salmeterol (ADVAIR DISKUS) 100-50 MCG/DOSE AEPB Inhale 1 puff into the lungs daily as needed (asthma).  . folic acid (FOLVITE) 865 MCG tablet  Take 800 mcg by mouth daily.  Marland Kitchen gabapentin (NEURONTIN) 300 MG capsule Take 2 capsules by mouth every 8 (eight) hours as needed.  Marland Kitchen ibuprofen (ADVIL) 600 MG tablet take 1 tablet po pc every 6 hours for 5 days then prn-post operative pain  . loratadine (CLARITIN) 10 MG tablet Take 10 mg by mouth daily as needed for allergies.  Marland Kitchen OVER THE COUNTER MEDICATION Take 30 mLs by mouth daily. [employer]  . oxyCODONE-acetaminophen (PERCOCET/ROXICET) 5-325 MG  tablet take 1-2  tablet(s)  po every 6 hours as needed for breakthrough post operative pain (Patient taking differently: take 1-2  tablet(s)  po every 6 hours as needed for breakthrough post operative pain)  . triamterene-hydrochlorothiazide (MAXZIDE-25) 37.5-25 MG tablet Take 1 tablet by mouth daily.     PAST MEDICAL HISTORY: Past Medical History:  Diagnosis Date  . Acne   . Anxiety   . Chronic headache 2018   improved after treatment for anxiety 2018  . History of sinusitis   . Insomnia   . Joint pain   . Obesity   . Uterine fibroid     PAST SURGICAL HISTORY: Past Surgical History:  Procedure Laterality Date  . ABDOMINAL HYSTERECTOMY N/A 09/07/2020   Procedure: HYSTERECTOMY ABDOMINAL;  Surgeon: Sanjuana Kava, MD;  Location: [facility];  Service: Gynecology;  Laterality: N/A;  . BILATERAL SALPINGECTOMY Bilateral 09/07/2020   Procedure: OPEN BILATERAL SALPINGECTOMY;  Surgeon: Sanjuana Kava, MD;  Location: [facility];  Service: Gynecology;  Laterality: Bilateral;  . BREAST REDUCTION SURGERY  2009, 2000  . HERNIA REPAIR    . WISDOM TOOTH EXTRACTION      FAMILY HISTORY: The patient family history includes Depression in her maternal grandmother; Diabetes in her maternal grandfather; Hypertension in her maternal grandfather, maternal grandmother, and mother; Stroke in her maternal aunt.  SOCIAL HISTORY:  The patient  reports that she has never smoked. She has never used smokeless tobacco. She reports current alcohol use of about 1.0 standard drink of alcohol per week. She reports current drug use. Drug: Marijuana.  REVIEW OF SYSTEMS: Review of Systems  Constitutional: Negative for chills and fever.  HENT: Negative for hoarse voice and nosebleeds.   Eyes: Negative for discharge, double vision and pain.  Cardiovascular: Positive for dyspnea on exertion. Negative for chest pain, claudication, leg swelling, near-syncope, orthopnea, palpitations, paroxysmal  nocturnal dyspnea and syncope.  Respiratory: Negative for hemoptysis and shortness of breath.   Musculoskeletal: Negative for muscle cramps and myalgias.  Gastrointestinal: Negative for abdominal pain, constipation, diarrhea, hematemesis, hematochezia, melena, nausea and vomiting.  Neurological: Negative for dizziness and light-headedness.    PHYSICAL EXAM: Vitals with BMI 12/18/2020 12/18/2020 11/26/2020  Height - 5\' 6"  -  Weight - 192 lbs 192 lbs 3 oz  BMI - 31 -  Systolic 540 086 761  Diastolic 90 950 98  Pulse 90 93 81   CONSTITUTIONAL: Well-developed and well-nourished. No acute distress.  SKIN: Skin is warm and dry. No rash noted. No cyanosis. No pallor. No jaundice HEAD: Normocephalic and atraumatic.  EYES: No scleral icterus MOUTH/THROAT: Moist oral membranes.  NECK: No JVD present. No thyromegaly noted. No carotid bruits  LYMPHATIC: No visible cervical adenopathy.  CHEST Normal respiratory effort. No intercostal retractions  LUNGS: Clear to auscultation bilaterally.  No stridor. No wheezes. No rales.  CARDIOVASCULAR: Regular, positive S1-S2, no murmurs rubs or gallops appreciated ABDOMINAL: Abdominal binder present, obese, no apparent ascites.  EXTREMITIES: No peripheral edema, 2+ dorsalis pedis and  posterior tibial pulses HEMATOLOGIC: No significant bruising NEUROLOGIC: Oriented to person, place, and time. Nonfocal. Normal muscle tone.  PSYCHIATRIC: Normal mood and affect. Normal behavior. Cooperative  CARDIAC DATABASE: EKG: 12/18/2020: Normal sinus rhythm, 91 bpm, normal axis, left atrial enlargement, without underlying ischemia or injury pattern.   Echocardiogram: No results found for this or any previous visit from the past 1095 days.   Stress Testing: No results found for this or any previous visit from the past 1095 days.  Heart Catheterization: None  LABORATORY DATA: CBC Latest Ref Rng & Units 09/08/2020 09/07/2020 09/30/2019  WBC 4.0 - 10.5 K/uL 10.5 4.3 4.2   Hemoglobin 12.0 - 15.0 g/dL 9.4(L) 10.6(L) 10.6(L)  Hematocrit 36.0 - 46.0 % 31.1(L) 34.3(L) 36.1  Platelets 150 - 400 K/uL 313 376 264    CMP Latest Ref Rng & Units 09/08/2020 09/07/2020 08/20/2019  Glucose 70 - 99 mg/dL 177(H) 95 86  BUN 6 - 20 mg/dL 11 13 9   Creatinine 0.44 - 1.00 mg/dL 0.88 0.84 0.75  Sodium 135 - 145 mmol/L 134(L) 135 139  Potassium 3.5 - 5.1 mmol/L 4.1 3.7 4.2  Chloride 98 - 111 mmol/L 104 107 108(H)  CO2 22 - 32 mmol/L 17(L) 19(L) 20  Calcium 8.9 - 10.3 mg/dL 8.8(L) 8.8(L) 9.2  Total Protein 6.0 - 8.5 g/dL - - 7.2  Total Bilirubin 0.0 - 1.2 mg/dL - - 0.2  Alkaline Phos 39 - 117 IU/L - - 55  AST 0 - 40 IU/L - - 17  ALT 0 - 32 IU/L - - 13    Lipid Panel  Lab Results  Component Value Date   CHOL 183 08/20/2019   HDL 63 08/20/2019   LDLCALC 108 (H) 08/20/2019   TRIG 65 08/20/2019   CHOLHDL 2.9 08/20/2019     No components found for: NTPROBNP No results for input(s): PROBNP in the last 8760 hours. No results for input(s): TSH in the last 8760 hours.  BMP Recent Labs    09/07/20 1040 09/08/20 0420  NA 135 134*  K 3.7 4.1  CL 107 104  CO2 19* 17*  GLUCOSE 95 177*  BUN 13 11  CREATININE 0.84 0.88  CALCIUM 8.8* 8.8*  GFRNONAA >60 >60    HEMOGLOBIN A1C Lab Results  Component Value Date   HGBA1C 6.5 (A) 11/26/2020    IMPRESSION:    ICD-10-CM   1. Benign hypertension  I10 EKG [id number]    Basic metabolic panel    Magnesium    PCV ECHOCARDIOGRAM COMPLETE  2. Type 2 diabetes mellitus without complication, without long-term current use of insulin (HCC)  E11.9   3. Marijuana use  F12.90   4. Class 1 obesity due to excess calories with serious comorbidity and body mass index (BMI) of 30.0 to 30.9 in adult  E66.09    Z68.30      RECOMMENDATIONS: Alisha Coffey is a 45 y.o. female whose past medical history and cardiac risk factors include: Anxiety, depression, chronic migraines, marijuana use, diabetes, hypertension, obesity due to excess  calorie.   Benign essential hypertension:  Improving since initiation of triamterene/hydrochlorothiazide.  However, blood pressure not at goal.  Educated on the importance of reducing the use of NSAIDs, decreasing stress and engaging in activities that would help with stress management, and consuming a low-salt diet.  Increasing physical activity to 30 minutes a day 5 days a week as tolerated.  Would like to check baseline BMP and magnesium as she has been on hydrochlorothiazide and  now triamterene/HCTZ prior to initiating additional antihypertensive medications.  As long as the kidney function and electrolytes are within normal limits would recommend starting losartan at night and follow-up labs in 1 week to evaluate kidney function again.  Patient is asked to keep a log of her blood pressures and to bring it in at the next office visit.  Echocardiogram will be ordered to evaluate for structural heart disease and left ventricular systolic function.  Diabetes mellitus:  Last hemoglobin A1c 6.5.  Currently managed by primary care provider.  Recommended that she have a fasting lipid profile performed when she sees her PCP.  Given her new diagnosis of diabetes, uncontrolled hypertension, and other cardiovascular risk factors she would benefit from stress test once her blood pressure is better controlled.  And if cholesterol levels are high recommend coronary calcium score for further risk stratification.  Marijuana use:  Educated in the importance of complete cessation of marijuana use.  Obesity, due to excess calories: Body mass index is 30.99 kg/m. . I reviewed with the patient the importance of diet, regular physical activity/exercise, weight loss.   . Patient is educated on increasing physical activity gradually as tolerated.  With the goal of moderate intensity exercise for 30 minutes a day 5 days a week.  FINAL MEDICATION LIST END OF ENCOUNTER: No orders of the defined types  were placed in this encounter.   Medications Discontinued During This Encounter  Medication Reason  . Multiple Minerals-Vitamins (CAL MAG ZINC +D3 PO) Error     Current Outpatient Medications:  .  albuterol (VENTOLIN HFA) 108 (90 Base) MCG/ACT inhaler, Inhale 2 puffs into the lungs every 6 (six) hours as needed for wheezing or shortness of breath., Disp: 18 g, Rfl: 0 .  ALPRAZolam (XANAX) 1 MG tablet, Take 1 mg by mouth daily as needed for anxiety., Disp: , Rfl:  .  Ascorbic Acid (VITAMIN C) 1000 MG tablet, Take 1,000 mg by mouth daily., Disp: , Rfl:  .  Cyanocobalamin (B-12) 2500 MCG TABS, Take 2,500 mcg by mouth daily., Disp: , Rfl:  .  doxycycline (VIBRA-TABS) 100 MG tablet, Take 100 mg by mouth 2 (two) times daily as needed., Disp: , Rfl:  .  ferrous gluconate (FERGON) 324 MG tablet, take 1 tablet po bid every other day, Disp: 180 tablet, Rfl: 1 .  Fluticasone-Salmeterol (ADVAIR DISKUS) 100-50 MCG/DOSE AEPB, Inhale 1 puff into the lungs daily as needed (asthma)., Disp: 3 each, Rfl: 0 .  folic acid (FOLVITE) 419 MCG tablet, Take 800 mcg by mouth daily., Disp: , Rfl:  .  gabapentin (NEURONTIN) 300 MG capsule, Take 2 capsules by mouth every 8 (eight) hours as needed., Disp: , Rfl:  .  ibuprofen (ADVIL) 600 MG tablet, take 1 tablet po pc every 6 hours for 5 days then prn-post operative pain, Disp: 30 tablet, Rfl: 1 .  loratadine (CLARITIN) 10 MG tablet, Take 10 mg by mouth daily as needed for allergies., Disp: , Rfl:  .  OVER THE COUNTER MEDICATION, Take 30 mLs by mouth daily. [place], Disp: , Rfl:  .  oxyCODONE-acetaminophen (PERCOCET/ROXICET) 5-325 MG tablet, take 1-2  tablet(s)  po every 6 hours as needed for breakthrough post operative pain (Patient taking differently: take 1-2  tablet(s)  po every 6 hours as needed for breakthrough post operative pain), Disp: 28 tablet, Rfl: 0 .  triamterene-hydrochlorothiazide (MAXZIDE-25) 37.5-25 MG tablet, Take 1 tablet by mouth daily., Disp: , Rfl:    Orders Placed This Encounter  Procedures  .  Basic metabolic panel  . Magnesium  . EKG 12-Lead  . PCV ECHOCARDIOGRAM COMPLETE    There are no Patient Instructions on file for this visit.   --Continue cardiac medications as reconciled in final medication list. --Return in about 5 weeks (around 01/22/2021) for Follow up, BP. Or sooner if needed. --Continue follow-up with your primary care physician regarding the management of your other chronic comorbid conditions.  Patient's questions and concerns were addressed to her satisfaction. She voices understanding of the instructions provided during this encounter.   This note was created using a voice recognition software as a result there may be grammatical errors inadvertently enclosed that do not reflect the nature of this encounter. Every attempt is made to correct such errors.  Total time spent: 73 minutes reviewing prior records from 2019, records received from PCPs office, records from care everywhere, discussing disease management, coordination of care, ordering diagnostic testing, and discussing medication profile.  Patient's questions and concerns were addressed to her satisfaction.  Rex Kras, [place], [facility]  Pager: [phone] Office: [phone]

## 2020-12-18 ENCOUNTER — Ambulatory Visit: Payer: BC Managed Care – PPO | Admitting: Cardiology

## 2020-12-18 ENCOUNTER — Encounter: Payer: Self-pay | Admitting: Cardiology

## 2020-12-18 ENCOUNTER — Other Ambulatory Visit: Payer: Self-pay

## 2020-12-18 VITALS — BP 140/90 | HR 90 | Temp 98.0°F | Resp 16 | Ht 66.0 in | Wt 192.0 lb

## 2020-12-18 DIAGNOSIS — F1290 Cannabis use, unspecified, uncomplicated: Secondary | ICD-10-CM

## 2020-12-18 DIAGNOSIS — I10 Essential (primary) hypertension: Secondary | ICD-10-CM

## 2020-12-18 DIAGNOSIS — E119 Type 2 diabetes mellitus without complications: Secondary | ICD-10-CM

## 2020-12-18 DIAGNOSIS — Z6830 Body mass index (BMI) 30.0-30.9, adult: Secondary | ICD-10-CM

## 2020-12-18 DIAGNOSIS — E6609 Other obesity due to excess calories: Secondary | ICD-10-CM

## 2020-12-19 LAB — BASIC METABOLIC PANEL
BUN/Creatinine Ratio: 18 (ref 9–23)
BUN: 15 mg/dL (ref 6–24)
CO2: 20 mmol/L (ref 20–29)
Calcium: 10.1 mg/dL (ref 8.7–10.2)
Chloride: 101 mmol/L (ref 96–106)
Creatinine, Ser: 0.85 mg/dL (ref 0.57–1.00)
Glucose: 127 mg/dL — ABNORMAL HIGH (ref 65–99)
Potassium: 3.5 mmol/L (ref 3.5–5.2)
Sodium: 140 mmol/L (ref 134–144)
eGFR: 88 mL/min/{1.73_m2} (ref >59)

## 2020-12-19 LAB — MAGNESIUM: Magnesium: 1.7 mg/dL (ref 1.6–2.3)

## 2020-12-23 ENCOUNTER — Other Ambulatory Visit: Payer: Self-pay | Admitting: Cardiology

## 2020-12-23 ENCOUNTER — Encounter: Payer: Self-pay | Admitting: Medical

## 2020-12-23 ENCOUNTER — Other Ambulatory Visit: Payer: Self-pay

## 2020-12-23 ENCOUNTER — Ambulatory Visit (INDEPENDENT_AMBULATORY_CARE_PROVIDER_SITE_OTHER): Payer: BC Managed Care – PPO | Admitting: Medical

## 2020-12-23 VITALS — BP 138/80 | HR 92 | Ht 66.0 in | Wt 190.2 lb

## 2020-12-23 DIAGNOSIS — R7301 Impaired fasting glucose: Secondary | ICD-10-CM

## 2020-12-23 DIAGNOSIS — J4520 Mild intermittent asthma, uncomplicated: Secondary | ICD-10-CM

## 2020-12-23 DIAGNOSIS — L739 Follicular disorder, unspecified: Secondary | ICD-10-CM | POA: Insufficient documentation

## 2020-12-23 DIAGNOSIS — J301 Allergic rhinitis due to pollen: Secondary | ICD-10-CM | POA: Insufficient documentation

## 2020-12-23 DIAGNOSIS — Z7185 Encounter for immunization safety counseling: Secondary | ICD-10-CM

## 2020-12-23 DIAGNOSIS — Z86018 Personal history of other benign neoplasm: Secondary | ICD-10-CM

## 2020-12-23 DIAGNOSIS — I10 Essential (primary) hypertension: Secondary | ICD-10-CM

## 2020-12-23 DIAGNOSIS — Z0000 Encounter for general adult medical examination without abnormal findings: Secondary | ICD-10-CM

## 2020-12-23 DIAGNOSIS — Z13220 Encounter for screening for lipoid disorders: Secondary | ICD-10-CM | POA: Insufficient documentation

## 2020-12-23 DIAGNOSIS — D509 Iron deficiency anemia, unspecified: Secondary | ICD-10-CM

## 2020-12-23 DIAGNOSIS — Z1159 Encounter for screening for other viral diseases: Secondary | ICD-10-CM

## 2020-12-23 MED ORDER — LOSARTAN POTASSIUM 50 MG PO TABS: 50.0000 mg | ORAL_TABLET | Freq: Every evening | ORAL | 0 refills | Status: DC

## 2020-12-23 NOTE — Progress Notes (Signed)
Subjective:   HPI  Alisha Coffey is a 45 y.o. female who presents for Chief Complaint  Patient presents with  . Annual Exam    ]    Patient Care Team: Jessilynn Taft, Leward Quan as PCP - General (Family Medicine) Dr. Sanjuana Kava and Earnstine Regal PA with gynecology Dr. Rex Kras, cardiology Dr. Chucky May, psychiatrist [facility] [place] [facility]   Concerns: She spent approx 30 minutes talking about her health issues and other visits with specialist over the last several months.   Recently started on BP medicaiton by cardiology.  Established care with cardiology recently  Had partial hysterectomy in 08/2020 due to ongoing fibroids.  Has been seeing gynecology for the past year regarding fibroids  Been under stress given work stress, family stress  On iron for ongoing iron deficiency anemia  recently had diabetes marker abnormal at cardiology.  Here to discuss  After hysterectomy, had some nerve damage, been out of work, seeing therapy for this.  Home BPs looking ok, no salt, cutting out sugar, good water intake   Past Medical History:  Diagnosis Date  . Acne   . Anxiety   . Chronic headache 2018   improved after treatment for anxiety 2018  . History of sinusitis   . Insomnia   . Joint pain   . Obesity   . Uterine fibroid     Family History  Problem Relation Age of Onset  . Hypertension Mother   . Depression Maternal Grandmother   . Hypertension Maternal Grandmother   . Diabetes Maternal Grandfather   . Hypertension Maternal Grandfather   . Stroke Maternal Aunt   . Cancer Neg Hx   . Heart disease Neg Hx      Current Outpatient Medications:  .  albuterol (VENTOLIN HFA) 108 (90 Base) MCG/ACT inhaler, Inhale 2 puffs into the lungs every 6 (six) hours as needed for wheezing or shortness of breath., Disp: 18 g, Rfl: 0 .  ALPRAZolam (XANAX) 1 MG tablet, Take 1 mg by mouth daily as needed for anxiety., Disp: , Rfl:  .  Ascorbic Acid (VITAMIN  C) 1000 MG tablet, Take 1,000 mg by mouth daily., Disp: , Rfl:  .  Cyanocobalamin (B-12) 2500 MCG TABS, Take 2,500 mcg by mouth daily., Disp: , Rfl:  .  ferrous gluconate (FERGON) 324 MG tablet, take 1 tablet po bid every other day, Disp: 180 tablet, Rfl: 1 .  folic acid (FOLVITE) 761 MCG tablet, Take 800 mcg by mouth daily., Disp: , Rfl:  .  loratadine (CLARITIN) 10 MG tablet, Take 10 mg by mouth daily as needed for allergies., Disp: , Rfl:  .  OVER THE COUNTER MEDICATION, Take 30 mLs by mouth daily. [place], Disp: , Rfl:  .  triamterene-hydrochlorothiazide (MAXZIDE-25) 37.5-25 MG tablet, Take 1 tablet by mouth daily., Disp: , Rfl:  .  Fluticasone-Salmeterol (ADVAIR DISKUS) 100-50 MCG/DOSE AEPB, Inhale 1 puff into the lungs daily as needed (asthma). (Patient not taking: Reported on 12/23/2020), Disp: 3 each, Rfl: 0 .  gabapentin (NEURONTIN) 300 MG capsule, Take 2 capsules by mouth every 8 (eight) hours as needed. (Patient not taking: Reported on 12/23/2020), Disp: , Rfl:  .  losartan (COZAAR) 50 MG tablet, Take 1 tablet (50 mg total) by mouth every evening. (Patient not taking: Reported on 12/23/2020), Disp: 90 tablet, Rfl: 0  Allergies  Allergen Reactions  . Belsomra [Suvorexant]     Didn't tolerate  . Propoxyphene Itching  . Valium [Diazepam]  Nausea And Vomiting    Reviewed their medical, surgical, family, social, medication, and allergy history and updated chart as appropriate.   Review of Systems Constitutional: -fever, -chills, -sweats, -unexpected weight change, -decreased appetite, -fatigue Allergy: +sneezing, -itching, -congestion Dermatology: -changing moles, --rash, -lumps ENT: -runny nose, -ear pain, -sore throat, -hoarseness, -sinus pain, -teeth pain, - ringing in ears, -hearing loss, -nosebleeds Cardiology: -chest pain, -palpitations, -swelling, -difficulty breathing when lying flat, -waking up short of breath Respiratory: -cough, -shortness of breath, -difficulty breathing  with exercise or exertion, -wheezing, -coughing up blood Gastroenterology: -abdominal pain, -nausea, -vomiting, -diarrhea, -constipation, -blood in stool, -changes in bowel movement, -difficulty swallowing or eating Hematology: -bleeding, -bruising  Musculoskeletal: -joint aches, -muscle aches, -joint swelling, -back pain, -neck pain, -cramping, -changes in gait Ophthalmology: denies vision changes, eye redness, itching, discharge Urology: -burning with urination, -difficulty urinating, -blood in urine, -urinary frequency, -urgency, -incontinence Neurology: -headache, -weakness, -tingling, -numbness, -memory loss, -falls, -dizziness Psychology: -depressed mood, -agitation, -sleep problems Breast/gyn: -breast tendnerss, -discharge, -lumps, -vaginal discharge,- irregular periods, -heavy periods   Diabetic Foot Exam - Simple   Simple Foot Form Diabetic Foot exam was performed with the following findings: Yes 12/23/2020  3:07 PM  Visual Inspection No deformities, no ulcerations, no other skin breakdown bilaterally: Yes Sensation Testing Intact to touch and monofilament testing bilaterally: Yes Pulse Check Posterior Tibialis and Dorsalis pulse intact bilaterally: Yes Comments       Objective:  BP 138/80   Pulse 92   Ht 5\' 6"  (1.676 m)   Wt 190 lb 3.2 oz (86.3 kg)   LMP 11/12/2018 (Exact Date)   SpO2 97%   BMI 30.70 kg/m    Wt Readings from Last 3 Encounters:  12/23/20 190 lb 3.2 oz (86.3 kg)  12/18/20 192 lb (87.1 kg)  11/26/20 192 lb 3.2 oz (87.2 kg)   BP Readings from Last 3 Encounters:  12/23/20 138/80  12/18/20 140/90  11/26/20 (!) 178/98    General appearance: alert, no distress, WD/WN, African American female Skin: some dry skin of lower legs, healing follicular papular lesions on chest (on treatment) HEENT: normocephalic, conjunctiva/corneas normal, sclerae anicteric, PERRLA, EOMi, nares patent, no discharge or erythema, pharynx normal Neck: supple, no  lymphadenopathy, no thyromegaly, no masses, normal ROM, no bruits Chest: non tender, normal shape and expansion Heart: RRR, normal S1, S2, no murmurs Lungs: CTA bilaterally, no wheezes, rhonchi, or rales Abdomen: +bs, soft, non tender, non distended, no masses, no hepatomegaly, no splenomegaly, no bruits Back: non tender, normal ROM, no scoliosis Musculoskeletal: upper extremities non tender, no obvious deformity, normal ROM throughout, lower extremities non tender, no obvious deformity, normal ROM throughout Extremities: no edema, no cyanosis, no clubbing Pulses: 2+ symmetric, upper and lower extremities, normal cap refill Neurological: alert, oriented x 3, CN2-12 intact, strength normal upper extremities and lower extremities, sensation normal throughout, DTRs 2+ throughout, no cerebellar signs, gait normal Psychiatric: normal affect, behavior normal, pleasant  Breast/gyn/rectal - deferred to gynecology  Diabetic Foot Exam - Simple   Simple Foot Form Diabetic Foot exam was performed with the following findings: Yes 12/23/2020  3:07 PM  Visual Inspection No deformities, no ulcerations, no other skin breakdown bilaterally: Yes Sensation Testing Intact to touch and monofilament testing bilaterally: Yes Pulse Check Posterior Tibialis and Dorsalis pulse intact bilaterally: Yes Comments      Assessment and Plan :   Encounter Diagnoses  Name Primary?  . Routine general medical examination at a health care facility Yes  . Mild intermittent asthma,  unspecified whether complicated   . Iron deficiency anemia, unspecified iron deficiency anemia type   . History of uterine fibroid   . Vaccine counseling   . Allergic rhinitis due to pollen, unspecified seasonality   . Screening for lipid disorders   . Impaired fasting blood sugar   . Encounter for hepatitis C screening test for low risk patient   . Folliculitis     Today you had a preventative care visit or wellness visit.    Topics  today may have included healthy lifestyle, diet, exercise, preventative care, vaccinations, sick and well care, proper use of emergency dept and after hours care, as well as other concerns.     Recommendations: Continue to return yearly for your annual wellness and preventative care visits.  This gives [place] a chance to discuss healthy lifestyle, exercise, vaccinations, review your chart record, and perform screenings where appropriate.  I recommend you see your eye doctor yearly for routine vision care.  I recommend you see your dentist yearly for routine dental care including hygiene visits twice yearly.   Vaccination recommendations were reviewed  Up to date except for covid booster  She plans to get this soon   Screening for cancer: Breast cancer screening: You should perform a self breast exam monthly.   We reviewed recommendations for regular mammograms and breast cancer screening.  Colon cancer screening:  Age 57  Cervical cancer screening: We reviewed recommendations for pap smear screening.  Skin cancer screening: Check your skin regularly for new changes, growing lesions, or other lesions of concern Come in for evaluation if you have skin lesions of concern.  Lung cancer screening: If you have a greater than 30 pack year history of tobacco use, then you qualify for lung cancer screening with a chest CT scan  We currently don't have screenings for other cancers besides breast, cervical, colon, and lung cancers.  If you have a strong family history of cancer or have other cancer screening concerns, please let me know.    Bone health: Get at least 150 minutes of aerobic exercise weekly Get weight bearing exercise at least once weekly   Heart health: Get at least 150 minutes of aerobic exercise weekly Limit alcohol It is important to maintain a healthy blood pressure and healthy cholesterol numbers  Follow up with Dr. Odis Hollingshead as planned   Separate significant  issues discussed: Anemia - likely due to blood loss from fibroid, but is now s/p hysterectomy 08/2020.   She will return for labs tomorrow  HTN - f/u with cardiology, c/t dyazide.  Impaired fasting glucose - gave script for glucometer, discussed diabetes, possible complications of diabetes, discussed need to make diet and exercise changes.  F/u tomorrow for fasting labs.  discussed need for routine diabetes f/u.  Asthma- mild intermittent, doing fine on current regimen  Folliculitis - finish antibiotic, f/u with dermatology    Antonya was seen today for annual exam.  Diagnoses and all orders for this visit:  Routine general medical examination at a health care facility -     Hepatic function panel; Future -     Lipid panel; Future -     Iron and TIBC; Future -     CBC with Differential; Future -     Glucose, Random; Future -     Cancel: HIV Antibody (routine testing w rflx) -     Cancel: Hepatitis C antibody -     HIV Antibody (routine testing w rflx); Future -  Hepatitis C antibody; Future  Mild intermittent asthma, unspecified whether complicated  Iron deficiency anemia, unspecified iron deficiency anemia type -     Iron and TIBC; Future -     CBC with Differential; Future  History of uterine fibroid  Vaccine counseling  Allergic rhinitis due to pollen, unspecified seasonality  Screening for lipid disorders -     Lipid panel; Future  Impaired fasting blood sugar  Encounter for hepatitis C screening test for low risk patient -     Cancel: Hepatitis C antibody -     Hepatitis C antibody; Future  Folliculitis   Follow-up pending labs, yearly for physical

## 2020-12-23 NOTE — Progress Notes (Signed)
No answer left a vm to call back

## 2020-12-24 ENCOUNTER — Ambulatory Visit: Payer: BC Managed Care – PPO

## 2020-12-24 ENCOUNTER — Other Ambulatory Visit: Payer: BC Managed Care – PPO

## 2020-12-24 DIAGNOSIS — I10 Essential (primary) hypertension: Secondary | ICD-10-CM

## 2020-12-24 DIAGNOSIS — Z13220 Encounter for screening for lipoid disorders: Secondary | ICD-10-CM

## 2020-12-24 DIAGNOSIS — Z0000 Encounter for general adult medical examination without abnormal findings: Secondary | ICD-10-CM

## 2020-12-24 DIAGNOSIS — D509 Iron deficiency anemia, unspecified: Secondary | ICD-10-CM

## 2020-12-24 DIAGNOSIS — Z1159 Encounter for screening for other viral diseases: Secondary | ICD-10-CM

## 2020-12-24 NOTE — Progress Notes (Signed)
Pt is aware.  

## 2020-12-25 ENCOUNTER — Other Ambulatory Visit: Payer: Self-pay | Admitting: Medical

## 2020-12-25 LAB — IRON AND TIBC
Iron Saturation: 20 % (ref 15–55)
Iron: 61 ug/dL (ref 27–159)
Total Iron Binding Capacity: 304 ug/dL (ref 250–450)
UIBC: 243 ug/dL (ref 131–425)

## 2020-12-25 LAB — CBC WITH DIFFERENTIAL/PLATELET
Basophils Absolute: 0.1 10*3/uL (ref 0.0–0.2)
Basos: 1 %
EOS (ABSOLUTE): 0 10*3/uL (ref 0.0–0.4)
Eos: 0 %
Hematocrit: 39 % (ref 34.0–46.6)
Hemoglobin: 13.6 g/dL (ref 11.1–15.9)
Immature Grans (Abs): 0.4 10*3/uL — ABNORMAL HIGH (ref 0.0–0.1)
Immature Granulocytes: 3 %
Lymphocytes Absolute: 2.2 10*3/uL (ref 0.7–3.1)
Lymphs: 20 %
MCH: 29.7 pg (ref 26.6–33.0)
MCHC: 34.9 g/dL (ref 31.5–35.7)
MCV: 85 fL (ref 79–97)
Monocytes Absolute: 0.8 10*3/uL (ref 0.1–0.9)
Monocytes: 7 %
Neutrophils Absolute: 7.7 10*3/uL — ABNORMAL HIGH (ref 1.4–7.0)
Neutrophils: 69 %
Platelets: 252 10*3/uL (ref 150–450)
RBC: 4.58 x10E6/uL (ref 3.77–5.28)
RDW: 20.5 % — ABNORMAL HIGH (ref 11.7–15.4)
WBC: 11.1 10*3/uL — ABNORMAL HIGH (ref 3.4–10.8)

## 2020-12-25 LAB — HEPATIC FUNCTION PANEL
ALT: 30 IU/L (ref 0–32)
AST: 16 IU/L (ref 0–40)
Albumin: 4.2 g/dL (ref 3.8–4.8)
Alkaline Phosphatase: 72 IU/L (ref 44–121)
Bilirubin Total: 0.3 mg/dL (ref 0.0–1.2)
Bilirubin, Direct: <0.1 mg/dL (ref 0.00–0.40)
Total Protein: 7.1 g/dL (ref 6.0–8.5)

## 2020-12-25 LAB — GLUCOSE, RANDOM: Glucose: 117 mg/dL — ABNORMAL HIGH (ref 65–99)

## 2020-12-25 LAB — LIPID PANEL
Chol/HDL Ratio: 4.6 ratio — ABNORMAL HIGH (ref 0.0–4.4)
Cholesterol, Total: 278 mg/dL — ABNORMAL HIGH (ref 100–199)
HDL: 60 mg/dL (ref >39)
LDL Chol Calc (NIH): 203 mg/dL — ABNORMAL HIGH (ref 0–99)
Triglycerides: 90 mg/dL (ref 0–149)
VLDL Cholesterol Cal: 15 mg/dL (ref 5–40)

## 2020-12-25 LAB — HEPATITIS C ANTIBODY: Hep C Virus Ab: <0.1 s/co ratio (ref 0.0–0.9)

## 2020-12-25 LAB — HIV ANTIBODY (ROUTINE TESTING W REFLEX): HIV Screen 4th Generation wRfx: NONREACTIVE

## 2020-12-25 MED ORDER — ROSUVASTATIN CALCIUM 10 MG PO TABS: 10.0000 mg | ORAL_TABLET | Freq: Every day | ORAL | 3 refills | Status: DC

## 2021-01-11 ENCOUNTER — Other Ambulatory Visit: Payer: Self-pay | Admitting: Medical

## 2021-01-11 MED ORDER — FREESTYLE LIBRE 14 DAY SENSOR MISC: 1.0000 | 11 refills | Status: DC

## 2021-01-26 ENCOUNTER — Encounter: Payer: Self-pay | Admitting: Cardiology

## 2021-01-26 ENCOUNTER — Ambulatory Visit: Payer: BC Managed Care – PPO | Admitting: Cardiology

## 2021-01-26 ENCOUNTER — Other Ambulatory Visit: Payer: Self-pay

## 2021-01-26 VITALS — BP 126/86 | HR 81 | Temp 97.7°F | Resp 16 | Ht 66.0 in | Wt 194.2 lb

## 2021-01-26 DIAGNOSIS — Z712 Person consulting for explanation of examination or test findings: Secondary | ICD-10-CM

## 2021-01-26 DIAGNOSIS — F1290 Cannabis use, unspecified, uncomplicated: Secondary | ICD-10-CM

## 2021-01-26 DIAGNOSIS — E6609 Other obesity due to excess calories: Secondary | ICD-10-CM

## 2021-01-26 DIAGNOSIS — E7800 Pure hypercholesterolemia, unspecified: Secondary | ICD-10-CM

## 2021-01-26 DIAGNOSIS — E119 Type 2 diabetes mellitus without complications: Secondary | ICD-10-CM

## 2021-01-26 DIAGNOSIS — I10 Essential (primary) hypertension: Secondary | ICD-10-CM

## 2021-01-26 DIAGNOSIS — E66811 Obesity, class 1: Secondary | ICD-10-CM

## 2021-01-26 NOTE — Progress Notes (Signed)
Date:  01/26/2021   ID:  Alisha Coffey, DOB Oct 11, 1975, MRN [id number]  PCP:  Carlena Hurl, PA-C  Cardiologist: Rex Kras, DO, [facility] (established care 12/18/2020) Former Cardiology Providers: Jeri Lager, APRN, FNP-C  Date: 01/26/21 Last Office Visit: 12/18/2020  Chief Complaint  Patient presents with  . Hypertension  . Follow-up    HPI  Alisha Coffey is a 45 y.o. African-American female who presents to the office with a chief complaint of " hypertension follow-up." Patient's past medical history and cardiovascular risk factors include: Anxiety, depression, chronic migraines, marijuana use, diabetes, hypertension, obesity due to excess calorie.   She is referred to the office at the request of Tysinger, Camelia Eng, PA-C for evaluation of hypertension.  Patient presented to the office in April 2022 for management of high blood pressure.  He was experiencing systolic blood pressures of 160-170 mmHg despite being on triamterene/hydrochlorothiazide.  Due to uncontrolled hypertension she was referred to cardiology for further evaluation and management.  Patient's medications as well as the timing of when she takes the pills were titrated and she was also started on losartan.  Since then patient's blood pressure has improved significantly.  Patient's office blood pressure is 126/86.  Her home blood pressure log also reviewed which is very favorable.  Patient states that she feels much better since improvement in her blood pressure with regards to reduced dyspnea on exertion.  She also followed up with her PCP in the recent past and has been started on statin therapy given her hyperlipidemia.  Echocardiogram results reviewed with her and noted below for further reference.  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  No structured exercise program or daily routine.  FUNCTIONAL STATUS: No structured exercise program or daily routine.    ALLERGIES: Allergies  Allergen Reactions  . Belsomra [Suvorexant]     Didn't tolerate  . Propoxyphene Itching  . Valium [Diazepam] Nausea And Vomiting    MEDICATION LIST PRIOR TO VISIT: Current Meds  Medication Sig  . albuterol (VENTOLIN HFA) 108 (90 Base) MCG/ACT inhaler Inhale 2 puffs into the lungs every 6 (six) hours as needed for wheezing or shortness of breath.  . ALPRAZolam (XANAX) 1 MG tablet Take 1 mg by mouth daily as needed for anxiety.  . Ascorbic Acid (VITAMIN C) 1000 MG tablet Take 1,000 mg by mouth daily.  . Continuous Blood Gluc Sensor (FREESTYLE LIBRE 14 DAY SENSOR) MISC 1 each by Does not apply route every 14 (fourteen) days.  . Cyanocobalamin (B-12) 2500 MCG TABS Take 2,500 mcg by mouth daily.  . ferrous gluconate (FERGON) 324 MG tablet take 1 tablet po bid every other day  . Fluticasone-Salmeterol (ADVAIR DISKUS) 100-50 MCG/DOSE AEPB Inhale 1 puff into the lungs daily as needed (asthma).  . folic acid (FOLVITE) 462 MCG tablet Take 800 mcg by mouth daily.  Marland Kitchen gabapentin (NEURONTIN) 300 MG capsule Take 2 capsules by mouth every 8 (eight) hours as needed.  . loratadine (CLARITIN) 10 MG tablet Take 10 mg by mouth daily as needed for allergies.  Marland Kitchen losartan (COZAAR) 50 MG tablet Take 1 tablet (50 mg total) by mouth every evening.  Marland Kitchen OVER THE COUNTER MEDICATION Take 30 mLs by mouth daily. [employer]  . rosuvastatin (CRESTOR) 10 MG tablet Take 1 tablet (10 mg total) by mouth daily.  Marland Kitchen triamterene-hydrochlorothiazide (MAXZIDE-25) 37.5-25 MG tablet Take 1 tablet by mouth daily.     PAST MEDICAL HISTORY: Past Medical History:  Diagnosis Date  . Acne   . Anxiety   . Chronic headache 2018   improved after treatment for anxiety 2018  . History of sinusitis   . Insomnia   . Joint pain   . Obesity   . Uterine fibroid     PAST SURGICAL HISTORY: Past Surgical History:  Procedure Laterality Date  . ABDOMINAL HYSTERECTOMY N/A 09/07/2020   Procedure: HYSTERECTOMY  ABDOMINAL;  Surgeon: Sanjuana Kava, MD;  [facility];  Service: Gynecology;  Laterality: N/A;  . BILATERAL SALPINGECTOMY Bilateral 09/07/2020   Procedure: OPEN BILATERAL SALPINGECTOMY;  Surgeon: Sanjuana Kava, MD;  Location: [place];  Service: Gynecology;  Laterality: Bilateral;  . BREAST REDUCTION SURGERY  2009, 2000  . HERNIA REPAIR    . WISDOM TOOTH EXTRACTION      FAMILY HISTORY: The patient family history includes Depression in her maternal grandmother; Diabetes in her maternal grandfather; Hypertension in her maternal grandfather, maternal grandmother, and mother; Stroke in her maternal aunt.  SOCIAL HISTORY:  The patient  reports that she has never smoked. She has never used smokeless tobacco. She reports current alcohol use of about 1.0 standard drink of alcohol per week. She reports current drug use. Drug: Marijuana.  REVIEW OF SYSTEMS: Review of Systems  Constitutional: Negative for chills and fever.  HENT: Negative for hoarse voice and nosebleeds.   Eyes: Negative for discharge, double vision and pain.  Cardiovascular: Negative for chest pain, claudication, dyspnea on exertion, leg swelling, near-syncope, orthopnea, palpitations, paroxysmal nocturnal dyspnea and syncope.  Respiratory: Negative for hemoptysis and shortness of breath.   Musculoskeletal: Negative for muscle cramps and myalgias.  Gastrointestinal: Negative for abdominal pain, constipation, diarrhea, hematemesis, hematochezia, melena, nausea and vomiting.  Neurological: Negative for dizziness and light-headedness.    PHYSICAL EXAM: Vitals with BMI 01/26/2021 12/23/2020 12/18/2020  Height 5\' 6"  5\' 6"  -  Weight 194 lbs 3 oz 190 lbs 3 oz -  BMI 84.16 60.63 -  Systolic 016 010 932  Diastolic 86 80 90  Pulse 81 92 90   CONSTITUTIONAL: Well-developed and well-nourished. No acute distress.  SKIN: Skin is warm and dry. No rash noted. No cyanosis. No pallor. No jaundice HEAD:  Normocephalic and atraumatic.  EYES: No scleral icterus MOUTH/THROAT: Moist oral membranes.  NECK: No JVD present. No thyromegaly noted. No carotid bruits  LYMPHATIC: No visible cervical adenopathy.  CHEST Normal respiratory effort. No intercostal retractions  LUNGS: Clear to auscultation bilaterally.  No stridor. No wheezes. No rales.  CARDIOVASCULAR: Regular, positive S1-S2, no murmurs rubs or gallops appreciated ABDOMINAL: Abdominal binder present, obese, no apparent ascites.  EXTREMITIES: No peripheral edema, 2+ dorsalis pedis and posterior tibial pulses HEMATOLOGIC: No significant bruising NEUROLOGIC: Oriented to person, place, and time. Nonfocal. Normal muscle tone.  PSYCHIATRIC: Normal mood and affect. Normal behavior. Cooperative  CARDIAC DATABASE: EKG: 12/18/2020: Normal sinus rhythm, 91 bpm, normal axis, left atrial enlargement, without underlying ischemia or injury pattern.   Echocardiogram: 12/24/2020: Normal LV systolic function with visual EF 60-65%. Left ventricle cavity is normal in size. Normal global wall motion. Normal diastolic filling pattern, normal LAP. No significant valvular abnormalities. No prior study for comparison.   Stress Testing: No results found for this or any previous visit from the past 1095 days.  Heart Catheterization: None  LABORATORY DATA: CBC Latest Ref Rng & Units 12/24/2020 09/08/2020 09/07/2020  WBC 3.4 - 10.8 x10E3/uL 11.1(H) 10.5 4.3  Hemoglobin 11.1 - 15.9 g/dL 13.6 9.4(L) 10.6(L)  Hematocrit 34.0 -  46.6 % 39.0 31.1(L) 34.3(L)  Platelets 150 - 450 x10E3/uL 252 313 376    CMP Latest Ref Rng & Units 12/24/2020 12/18/2020 09/08/2020  Glucose 65 - 99 mg/dL 117(H) 127(H) 177(H)  BUN 6 - 24 mg/dL - 15 11  Creatinine 0.57 - 1.00 mg/dL - 0.85 0.88  Sodium 134 - 144 mmol/L - 140 134(L)  Potassium 3.5 - 5.2 mmol/L - 3.5 4.1  Chloride 96 - 106 mmol/L - 101 104  CO2 20 - 29 mmol/L - 20 17(L)  Calcium 8.7 - 10.2 mg/dL - 10.1 8.8(L)  Total  Protein 6.0 - 8.5 g/dL 7.1 - -  Total Bilirubin 0.0 - 1.2 mg/dL 0.3 - -  Alkaline Phos 44 - 121 IU/L 72 - -  AST 0 - 40 IU/L 16 - -  ALT 0 - 32 IU/L 30 - -    Lipid Panel  Lab Results  Component Value Date   CHOL 278 (H) 12/24/2020   HDL 60 12/24/2020   LDLCALC 203 (H) 12/24/2020   TRIG 90 12/24/2020   CHOLHDL 4.6 (H) 12/24/2020   No components found for: NTPROBNP No results for input(s): PROBNP in the last 8760 hours. No results for input(s): TSH in the last 8760 hours.  BMP Recent Labs    09/07/20 1040 09/08/20 0420 12/18/20 1426 12/24/20 0913  NA 135 134* 140  --   K 3.7 4.1 3.5  --   CL 107 104 101  --   CO2 19* 17* 20  --   GLUCOSE 95 177* 127* 117*  BUN 13 11 15   --   CREATININE 0.84 0.88 0.85  --   CALCIUM 8.8* 8.8* 10.1  --   GFRNONAA >60 >60  --   --     HEMOGLOBIN A1C Lab Results  Component Value Date   HGBA1C 6.5 (A) 11/26/2020    IMPRESSION:    ICD-10-CM   1. Benign hypertension  I10 PCV CARDIAC STRESS TEST    SARS-COV-2 RNA,(COVID-19) QUAL NAAT  2. Type 2 diabetes mellitus without complication, without long-term current use of insulin (HCC)  E11.9   3. Pure hypercholesterolemia  E78.00   4. Marijuana use  F12.90   5. Encounter to discuss test results  Z71.2   6. Class 1 obesity due to excess calories with serious comorbidity and body mass index (BMI) of 30.0 to 30.9 in adult  E66.09    Z68.30      RECOMMENDATIONS: Robertta D Botkins is a 45 y.o. female whose past medical history and cardiac risk factors include: Anxiety, depression, chronic migraines, marijuana use, diabetes, hyperlipidemia, hypertension, obesity due to excess calorie.   Benign essential hypertension:  Office blood pressures within acceptable range.  Home blood pressure log reviewed.  Can continue current medical therapy.  Encouraged her to decrease the amount of salt intake and to increase physical activity as tolerated with a goal of 30 minutes a day 5 days a  week.  Echocardiogram results reviewed with her in great detail at today's office visit.  Findings noted above.  Diabetes mellitus:  Last hemoglobin A1c 6.5.  Currently managed by primary care provider.  Educated on importance of glycemic control.  Hyperlipidemia:  Most recent LDL 203 mg/dL.  Recommend a goal LDL of 70 mg/dL or less.  Started on statin therapy by her PCP, which I agree.  Does not endorse any myalgias.  Currently managed by primary care provider.  Marijuana use:  Improving   Educated in the importance of complete  cessation of marijuana use.  Obesity, due to excess calories: Body mass index is 31.34 kg/m. . I reviewed with the patient the importance of diet, regular physical activity/exercise, weight loss.   . Patient is educated on increasing physical activity gradually as tolerated.  With the goal of moderate intensity exercise for 30 minutes a day 5 days a week.  Overall patient has done well since our initial visit and she is congratulated on improving/addressing her modifiable cardiovascular risk factors.  Since her blood pressure is very well controlled I think she would benefit from an exercise treadmill stress test to evaluate for functional status and also to evaluate for exercise-induced ischemia given multiple cardiovascular risk factors including but not limited to diabetes, hyperlipidemia.  Patient is agreeable with the plan of care.  We will order a GXT and if abnormal will call her in sooner for follow-up otherwise I would like to see her back in 6 months.  If she remains stable at 6 months the plan is to see her on an annual basis.  Patient is very thankful for the care provided.  FINAL MEDICATION LIST END OF ENCOUNTER: No orders of the defined types were placed in this encounter.   There are no discontinued medications.   Current Outpatient Medications:  .  albuterol (VENTOLIN HFA) 108 (90 Base) MCG/ACT inhaler, Inhale 2 puffs into the lungs  every 6 (six) hours as needed for wheezing or shortness of breath., Disp: 18 g, Rfl: 0 .  ALPRAZolam (XANAX) 1 MG tablet, Take 1 mg by mouth daily as needed for anxiety., Disp: , Rfl:  .  Ascorbic Acid (VITAMIN C) 1000 MG tablet, Take 1,000 mg by mouth daily., Disp: , Rfl:  .  Continuous Blood Gluc Sensor (FREESTYLE LIBRE 14 DAY SENSOR) MISC, 1 each by Does not apply route every 14 (fourteen) days., Disp: 2 each, Rfl: 11 .  Cyanocobalamin (B-12) 2500 MCG TABS, Take 2,500 mcg by mouth daily., Disp: , Rfl:  .  ferrous gluconate (FERGON) 324 MG tablet, take 1 tablet po bid every other day, Disp: 180 tablet, Rfl: 1 .  Fluticasone-Salmeterol (ADVAIR DISKUS) 100-50 MCG/DOSE AEPB, Inhale 1 puff into the lungs daily as needed (asthma)., Disp: 3 each, Rfl: 0 .  folic acid (FOLVITE) 161 MCG tablet, Take 800 mcg by mouth daily., Disp: , Rfl:  .  gabapentin (NEURONTIN) 300 MG capsule, Take 2 capsules by mouth every 8 (eight) hours as needed., Disp: , Rfl:  .  loratadine (CLARITIN) 10 MG tablet, Take 10 mg by mouth daily as needed for allergies., Disp: , Rfl:  .  losartan (COZAAR) 50 MG tablet, Take 1 tablet (50 mg total) by mouth every evening., Disp: 90 tablet, Rfl: 0 .  OVER THE COUNTER MEDICATION, Take 30 mLs by mouth daily. [place], Disp: , Rfl:  .  rosuvastatin (CRESTOR) 10 MG tablet, Take 1 tablet (10 mg total) by mouth daily., Disp: 90 tablet, Rfl: 3 .  triamterene-hydrochlorothiazide (MAXZIDE-25) 37.5-25 MG tablet, Take 1 tablet by mouth daily., Disp: , Rfl:  .  doxycycline (VIBRA-TABS) 100 MG tablet, Take 100 mg by mouth 2 (two) times daily., Disp: , Rfl:   Orders Placed This Encounter  Procedures  . SARS-COV-2 RNA,(COVID-19) QUAL NAAT  . PCV CARDIAC STRESS TEST    There are no Patient Instructions on file for this visit.   --Continue cardiac medications as reconciled in final medication list. --Return in about 6 months (around 07/28/2021) for Follow up, BP, Review test results. Or  sooner if  needed. --Continue follow-up with your primary care physician regarding the management of your other chronic comorbid conditions.  Patient's questions and concerns were addressed to her satisfaction. She voices understanding of the instructions provided during this encounter.   This note was created using a voice recognition software as a result there may be grammatical errors inadvertently enclosed that do not reflect the nature of this encounter. Every attempt is made to correct such errors.  Rex Kras, [place], [facility]  Pager: [phone] Office: [phone]

## 2021-02-18 ENCOUNTER — Other Ambulatory Visit: Payer: Self-pay

## 2021-02-18 ENCOUNTER — Encounter: Payer: Self-pay | Admitting: Medical

## 2021-02-18 ENCOUNTER — Ambulatory Visit (INDEPENDENT_AMBULATORY_CARE_PROVIDER_SITE_OTHER): Payer: BC Managed Care – PPO | Admitting: Medical

## 2021-02-18 VITALS — BP 120/78 | HR 91 | Ht 66.0 in | Wt 189.2 lb

## 2021-02-18 DIAGNOSIS — D72829 Elevated white blood cell count, unspecified: Secondary | ICD-10-CM | POA: Insufficient documentation

## 2021-02-18 DIAGNOSIS — R7309 Other abnormal glucose: Secondary | ICD-10-CM | POA: Insufficient documentation

## 2021-02-18 DIAGNOSIS — D72828 Other elevated white blood cell count: Secondary | ICD-10-CM

## 2021-02-18 DIAGNOSIS — I10 Essential (primary) hypertension: Secondary | ICD-10-CM | POA: Insufficient documentation

## 2021-02-18 DIAGNOSIS — E785 Hyperlipidemia, unspecified: Secondary | ICD-10-CM

## 2021-02-18 DIAGNOSIS — Z6830 Body mass index (BMI) 30.0-30.9, adult: Secondary | ICD-10-CM | POA: Insufficient documentation

## 2021-02-18 LAB — CBC WITH DIFFERENTIAL/PLATELET
Basophils Absolute: 0 10*3/uL (ref 0.0–0.2)
Basos: 1 %
EOS (ABSOLUTE): 0 10*3/uL (ref 0.0–0.4)
Eos: 1 %
Hematocrit: 39.6 % (ref 34.0–46.6)
Hemoglobin: 13.3 g/dL (ref 11.1–15.9)
Immature Grans (Abs): 0.1 10*3/uL (ref 0.0–0.1)
Immature Granulocytes: 1 %
Lymphocytes Absolute: 1.9 10*3/uL (ref 0.7–3.1)
Lymphs: 31 %
MCH: 32.6 pg (ref 26.6–33.0)
MCHC: 33.6 g/dL (ref 31.5–35.7)
MCV: 97 fL (ref 79–97)
Monocytes Absolute: 0.4 10*3/uL (ref 0.1–0.9)
Monocytes: 7 %
Neutrophils Absolute: 3.6 10*3/uL (ref 1.4–7.0)
Neutrophils: 59 %
Platelets: 244 10*3/uL (ref 150–450)
RBC: 4.08 x10E6/uL (ref 3.77–5.28)
RDW: 14.8 % (ref 11.7–15.4)
WBC: 6 10*3/uL (ref 3.4–10.8)

## 2021-02-18 LAB — POCT GLYCOSYLATED HEMOGLOBIN (HGB A1C): Hemoglobin A1C: 6.1 % — AB (ref 4.0–5.6)

## 2021-02-18 LAB — LIPID PANEL
Chol/HDL Ratio: 2.7 ratio (ref 0.0–4.4)
Cholesterol, Total: 177 mg/dL (ref 100–199)
HDL: 66 mg/dL (ref >39)
LDL Chol Calc (NIH): 100 mg/dL — ABNORMAL HIGH (ref 0–99)
Triglycerides: 57 mg/dL (ref 0–149)
VLDL Cholesterol Cal: 11 mg/dL (ref 5–40)

## 2021-02-18 MED ORDER — RYBELSUS 3 MG PO TABS: 3.0000 mg | ORAL_TABLET | Freq: Every day | ORAL | 0 refills | Status: AC

## 2021-02-18 NOTE — Progress Notes (Signed)
Subjective:  Alisha Coffey is a 45 y.o. female who presents for Chief Complaint  Patient presents with   Follow-up    With fasting labs      Here for follow-up.  At her last visit in April her cholesterol was high and we recommended she start Crestor to lower risk of heart disease and stroke.  Last visit her blood sugars are elevated, her white cells are elevated.  Her iron was at the low end of normal.  She continues on iron therapy.  She denies any bleeding or bruising.  No blood in the stool.  She had a rough year this past year with fibroid and hernia surgeries, so diet wasn't the best this past year.  She has the freestyle libre, 96 fasting this morning.  She has taking steps to do better with diet and exercise.  She is working from home, works for [employer].  She may request a note going forward to continue to work at home given her refusal of serious illness from [place].  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  She had difficulty recovering from surgery last year and she feels like her immune system is decreased due to her underlying health issues  No other aggravating or relieving factors.    No other c/o.  Past Medical History:  Diagnosis Date   Acne    Anxiety    Chronic headache 2018   improved after treatment for anxiety 2018   History of sinusitis    Insomnia    Joint pain    Obesity    Uterine fibroid      The following portions of the patient's history were reviewed and updated as appropriate: allergies, current medications, past family history, past medical history, past social history, past surgical history and problem list.  ROS Otherwise as in subjective above  Objective: BP 120/78   Pulse 91   Ht 5\' 6"  (1.676 m)   Wt 189 lb 3.2 oz (85.8 kg)   LMP 11/12/2018 (Exact Date)   SpO2 98%   BMI 30.54 kg/m   General appearance: alert, no distress, well developed, well nourished Otherwise not  examined    Assessment: Encounter Diagnoses  Name Primary?   Elevated glucose Yes   Other elevated white blood cell (WBC) count    Hyperlipidemia, unspecified hyperlipidemia type    Primary hypertension    BMI 30.0-30.9,adult      Plan: I reviewed her labs from December 24, 2020.  Lipid panel was normal, blood sugar was 117 fasting, white cells are mildly elevated as well as neutrophils, although she had no signs or symptoms of infection.  Her LDL was 203, total cholesterol 278.  We discussed her abnormal labs.  We discussed lifestyle changes  Impaired glucose-begin Rybelsus samples tablet.  Discussed risk and benefits of medication.  Continue efforts to lose weight through healthy diet and exercise.   Hyperlipidemia-she has started Crestor since last visit.  Labs today  Hypertension-continue follow-up with cardiology, compliant with medication  Leukocytosis last visit.  She has no signs or symptoms of illness.  Recheck CBC today  She may eventually need a letter for work to work from home given concerns of COVID infection risk and her underlying chronic issues  Chyrl was seen today for follow-up.  Diagnoses and all orders for this visit:  Elevated glucose -     HgB A1c  Other elevated white blood cell (WBC) count -  CBC with Differential/Platelet  Hyperlipidemia, unspecified hyperlipidemia type -     Lipid panel  Primary hypertension  BMI 30.0-30.9,adult  Other orders -     Semaglutide (RYBELSUS) 3 MG TABS; Take 3 mg by mouth daily.   Follow up: pending labs

## 2021-02-18 NOTE — Patient Instructions (Signed)
Sample Meal Plan  Breakfast You may eat 1 of the following Smoothie with Almond milk, handful of kale or spinach, and 1-2 fruit servings of your choice such as berries or 1/2 banana Whole grain slice of toast and thin layer of low sugar jam or small amount of honey Whole grain slice of toast and avocado spread 1/2 cup of steel cut oats (oatmeal)  Lunch A protein source such as 1 of the following: 1 serving of beans such as black beans, pinto beans, green beans, or edamame (soy beans) Veggie burger  Non breaded fish such as salmon or tuna, either baked, grilled, or broiled Vegetable - Half of your plate should be a non-starchy vegetables!  So avoid white potatoes and corn.  Otherwise, eat a large portion of vegetables. Avocado, cucumber, tomato, carrots, greens, lettuce, squash, okra, etc.  Vegetables can include salad with olive oil/vinaigrette dressing Grains such as 1/2 cup of brown rice, quinoa, barley or other whole grain or 1 or 2 slices of whole grain bread   Dinner A protein source such as 1 of the following: 1 serving of beans such as black beans, pinto beans, green beans, or edamame (soy beans) Veggie burger  Non breaded fish such as salmon or tuna, either baked, grilled, or broiled Vegetable - Half of your plate should be a non-starchy vegetables!  So avoid white potatoes and corn.  Otherwise, eat a large portion of vegetables. Avocado, cucumber, tomato, carrots, greens, lettuce, squash, okra, etc.  Vegetables can include salad with olive oil/vinaigrette dressing Grains such as 1/2 cup of brown rice, quinoa, barley or other whole grain or 1 or 2 slices of whole grain bread    Snack 1 fruit serving such as one of the following: medium-sized apple medium-sized orange, Tangerine 1/2 banana  3/4 cup of fresh berries or frozen berries  A protein source such as one of the following: 8 almonds  small handful of walnuts or other nuts Hummus and vegetable such as  carrots    Beverages: Water Unsweet tea Home made juice with a juicer without sugar added other than small bit of honey or agave nectar Water with sugar free flavor such as [place]   AVOID.... For the time being I want you to cut out the following items completely: Soda, sweet tea, juice, beer or wine or alcohol Sweets such as cake, candy, pies, chips, cookies, chocolate

## 2021-02-19 ENCOUNTER — Ambulatory Visit: Payer: BC Managed Care – PPO

## 2021-02-19 DIAGNOSIS — I10 Essential (primary) hypertension: Secondary | ICD-10-CM

## 2021-03-16 ENCOUNTER — Other Ambulatory Visit: Payer: Self-pay | Admitting: Cardiology

## 2021-03-16 DIAGNOSIS — I10 Essential (primary) hypertension: Secondary | ICD-10-CM

## 2021-03-22 ENCOUNTER — Ambulatory Visit (INDEPENDENT_AMBULATORY_CARE_PROVIDER_SITE_OTHER): Payer: BC Managed Care – PPO | Admitting: Medical

## 2021-03-22 ENCOUNTER — Encounter: Payer: Self-pay | Admitting: Medical

## 2021-03-22 ENCOUNTER — Other Ambulatory Visit: Payer: Self-pay

## 2021-03-22 ENCOUNTER — Other Ambulatory Visit: Payer: Self-pay | Admitting: Medical

## 2021-03-22 VITALS — BP 122/80 | HR 93 | Temp 98.1°F | Wt 191.4 lb

## 2021-03-22 DIAGNOSIS — R7301 Impaired fasting glucose: Secondary | ICD-10-CM

## 2021-03-22 DIAGNOSIS — Z6830 Body mass index (BMI) 30.0-30.9, adult: Secondary | ICD-10-CM | POA: Diagnosis not present

## 2021-03-22 DIAGNOSIS — E559 Vitamin D deficiency, unspecified: Secondary | ICD-10-CM

## 2021-03-22 DIAGNOSIS — I10 Essential (primary) hypertension: Secondary | ICD-10-CM | POA: Diagnosis not present

## 2021-03-22 DIAGNOSIS — E785 Hyperlipidemia, unspecified: Secondary | ICD-10-CM

## 2021-03-22 MED ORDER — PRENATAL PLUS VITAMIN/MINERAL 27-1 MG PO TABS: 1.0000 | ORAL_TABLET | Freq: Every day | ORAL | 1 refills | Status: DC

## 2021-03-22 NOTE — Progress Notes (Signed)
Subjective:  Alisha Coffey is a 45 y.o. female who presents for Chief Complaint  Patient presents with   other    F/u      Here for recheck.  At her last visit we started Rybelsus for both impaired glucose prediabetes and obesity.  She does not think she has lost any weight but is trying to eat healthy.  She does note however that the medicine leads a very bad taste on her tongue.  She does not want to stop it yet but it does have a bad aftertaste.  She is working on Alisha Coffey and trying to get exercise  Hypertension-she continues on her 2 medications without complaint  Hyperlipidemia-so far still having no problems with Crestor started in June  History of anemia in the past when she had a very heavy periods.  However she saw gynecology and had procedure to help stop bleeding and no longer has problems with heavy periods.   she has continued on iron folate and B12.  She has been taking iron daily for at least the last year or more.  No other aggravating or relieving factors.    No other c/o.  Past Medical History:  Diagnosis Date   Acne    Anxiety    Chronic headache 2018   improved after treatment for anxiety 2018   History of sinusitis    Insomnia    Joint pain    Obesity    Uterine fibroid    Current Outpatient Medications on File Prior to Visit  Medication Sig Dispense Refill   Ascorbic Acid (VITAMIN C) 1000 MG tablet Take 1,000 mg by mouth daily.     clindamycin (CLEOCIN T) 1 % external solution Apply topically 2 (two) times daily.     Continuous Blood Gluc Sensor (FREESTYLE LIBRE 14 DAY SENSOR) MISC 1 each by Does not apply route every 14 (fourteen) days. 2 each 11   Cyanocobalamin (B-12) 2500 MCG TABS Take 2,500 mcg by mouth daily.     folic acid (FOLVITE) [id number] MCG tablet Take 800 mcg by mouth daily.     gabapentin (NEURONTIN) 300 MG capsule Take 2 capsules by mouth every 8 (eight) hours as needed.     loratadine (CLARITIN) 10 MG tablet Take 10 mg by mouth daily as  needed for allergies.     losartan (COZAAR) 50 MG tablet TAKE 1 TABLET BY MOUTH EVERY EVENING. 90 tablet 0   rosuvastatin (CRESTOR) 10 MG tablet Take 1 tablet (10 mg total) by mouth daily. 90 tablet 3   triamterene-hydrochlorothiazide (MAXZIDE-25) 37.5-25 MG tablet Take 1 tablet by mouth daily.     albuterol (VENTOLIN HFA) 108 (90 Base) MCG/ACT inhaler Inhale 2 puffs into the lungs every 6 (six) hours as needed for wheezing or shortness of breath. (Patient not taking: Reported on 03/22/2021) 18 g 0   ALPRAZolam (XANAX) 1 MG tablet Take 1 mg by mouth daily as needed for anxiety. (Patient not taking: Reported on 03/22/2021)     Fluticasone-Salmeterol (ADVAIR DISKUS) 100-50 MCG/DOSE AEPB Inhale 1 puff into the lungs daily as needed (asthma). (Patient not taking: Reported on 03/22/2021) 3 each 0   OVER THE COUNTER MEDICATION Take 30 mLs by mouth daily. [place] (Patient not taking: No sig reported)     No current facility-administered medications on file prior to visit.     The following portions of the patient's history were reviewed and updated as appropriate: allergies, current medications, past family history, past medical history, past  social history, past surgical history and problem list.  ROS Otherwise as in subjective above   Objective: BP 122/80   Pulse 93   Temp 98.1 F (36.7 C)   Wt 191 lb 6.4 oz (86.8 kg)   LMP 11/12/2018 (Exact Date)   BMI 30.89 kg/m   Wt Readings from Last 3 Encounters:  03/22/21 191 lb 6.4 oz (86.8 kg)  02/18/21 189 lb 3.2 oz (85.8 kg)  01/26/21 194 lb 3.2 oz (88.1 kg)   General appearance: alert, no distress, well developed, well nourished Neck: supple, no lymphadenopathy, no thyromegaly, no masses Heart: RRR, normal S1, S2, no murmurs Lungs: CTA bilaterally, no wheezes, rhonchi, or rales Pulses: 2+ radial pulses, 2+ pedal pulses, normal cap refill Ext: no edema     Assessment: Encounter Diagnoses  Name Primary?   Primary hypertension Yes    Impaired fasting blood sugar    Hyperlipidemia, unspecified hyperlipidemia type    BMI 30.0-30.9,adult    Vitamin D deficiency      Plan:  Impaired fasting glucose -she notes a bad taste in her mouth Rybelsus but is willing to continue this for now.  We discussed other possible medications, pros and cons of various medication options, however she wants to stick with the Rybelsus for now.  Obesity-we started Rybelsus last visit to help with both prediabetes and obesity.  So far no major changes in weight.  Continue efforts with healthy diet and exercise  HTN - c/t current medication.  Hyperlipidemia - continue Crestor.  Last visit her labs significantly improved on statin that was begun recently  History of anemia, worse prior with heavy periods however she has had gynecological procedure at this point and no longer has heavy periods.  Her recent CBC was normal.  She will stop ferrous gluconate but instead take a multivitamin/prenatal vitamin daily.  Likewise we discussed possibly stopping B12 and folate as she likely does not need to be on those at this time.  She will consider but she feels like the B12 does give her a boost in energy  Vitamin D deficiency in 2019.  Recheck labs today.  Will likely go back on supplement.  Counseled on diet and sun exposure    [name] was seen today for other.  Diagnoses and all orders for this visit:  Primary hypertension  Impaired fasting blood sugar  Hyperlipidemia, unspecified hyperlipidemia type  BMI 30.0-30.9,adult  Vitamin D deficiency -     VITAMIN D 25 Hydroxy (Vit-D Deficiency, Fractures)  Other orders -     Prenatal Vit-Fe Fumarate-FA (PRENATAL PLUS VITAMIN/MINERAL) 27-1 MG TABS; Take 1 tablet by mouth daily.   Follow up: Call in 1 month to give me an update on weight loss efforts, recheck in 4 to 6 months on impaired glucose

## 2021-03-23 ENCOUNTER — Other Ambulatory Visit: Payer: Self-pay | Admitting: Medical

## 2021-03-23 LAB — VITAMIN D 25 HYDROXY (VIT D DEFICIENCY, FRACTURES): Vit D, 25-Hydroxy: 11.5 ng/mL — ABNORMAL LOW (ref 30.0–100.0)

## 2021-03-23 MED ORDER — VITAMIN D 50 MCG (2000 UT) PO CAPS: 1.0000 | ORAL_CAPSULE | Freq: Every day | ORAL | 1 refills | Status: DC

## 2021-03-31 ENCOUNTER — Telehealth: Payer: Self-pay | Admitting: Medical

## 2021-03-31 NOTE — Telephone Encounter (Signed)
Pharmacy sent request for new rx prenatal vitamins are on back order  requesting new rx for prenatal vitamins,  Please send to the [id number] - [address]

## 2021-05-12 ENCOUNTER — Encounter: Payer: Self-pay | Admitting: Internal Medicine

## 2021-05-12 DIAGNOSIS — Z90710 Acquired absence of both cervix and uterus: Secondary | ICD-10-CM

## 2021-05-28 IMAGING — CT CT ABD-PELV W/ CM
1 of 3 series · 13 of 32 positions shown, 19 images · IV contrast (APPLIED)
Comparison: None.

CLINICAL DATA: 44-year-old female with recent hysterectomy and
pelvic pain.

EXAM:
CT ABDOMEN AND PELVIS WITH CONTRAST
TECHNIQUE: Multidetector CT imaging of the abdomen and pelvis was performed
using the standard protocol following bolus administration of
intravenous contrast.
CONTRAST:  100mL [id number] IOPAMIDOL ([id number]) INJECTION 61%

[Series 2: abd/pelvis w/cm · axial · 0.73mm/px · z∈[-467,-47]mm · 13 of 100 slices shown, 19 images]
[im 8/100  soft-tissue]
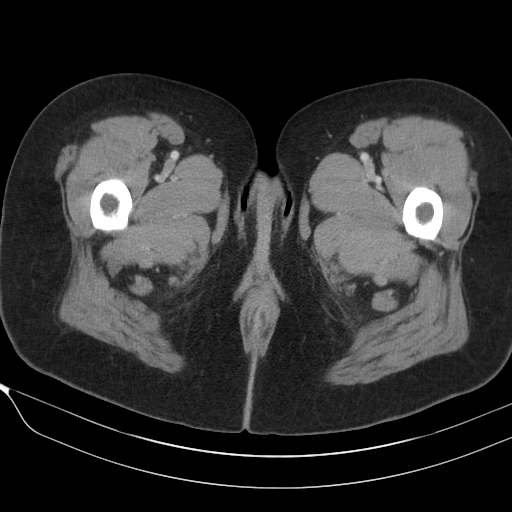
[im 8/100  bone]
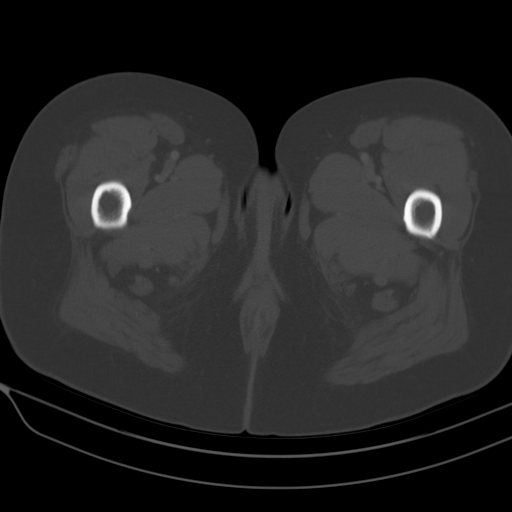
[im 15/100  soft-tissue]
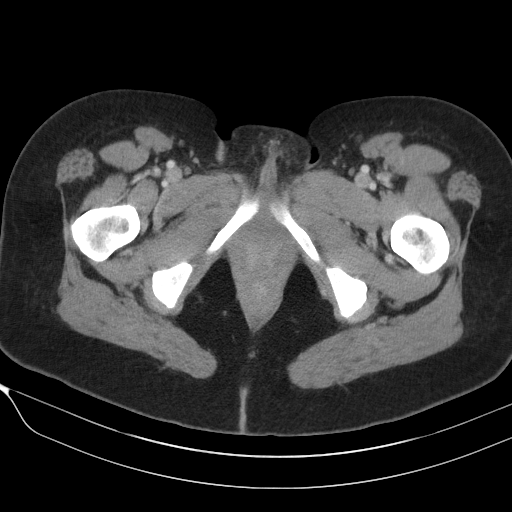
[im 22/100  soft-tissue]
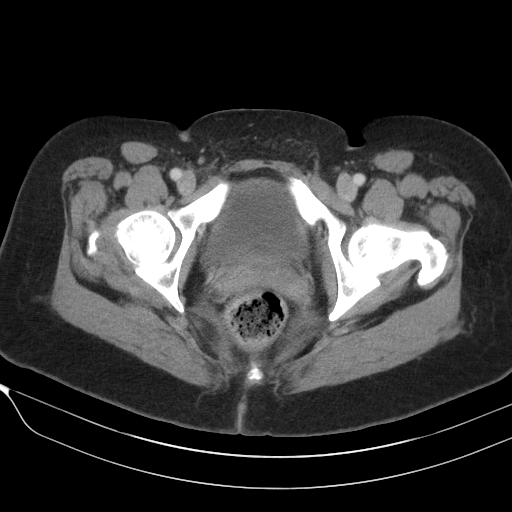
[im 29/100  soft-tissue]
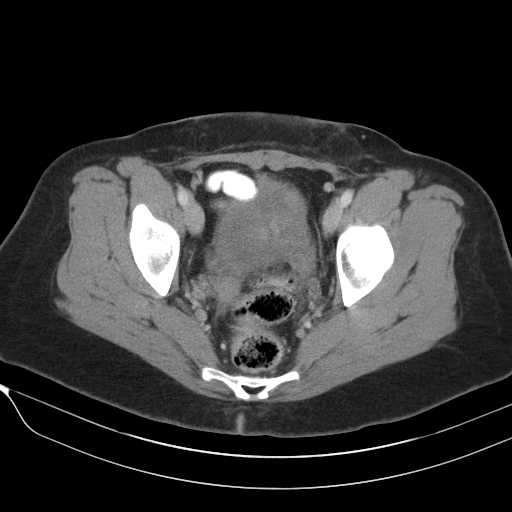
[im 36/100  soft-tissue]
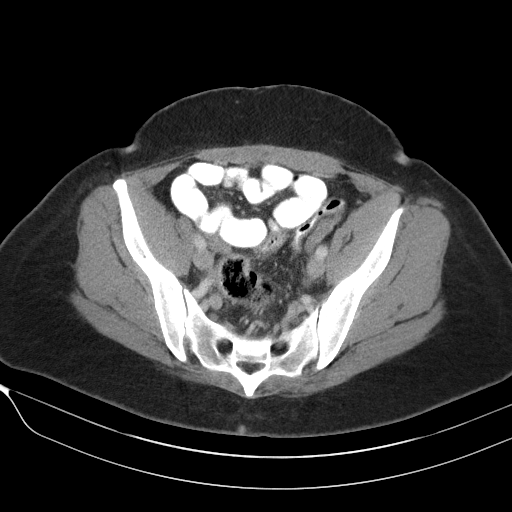
[im 43/100  soft-tissue]
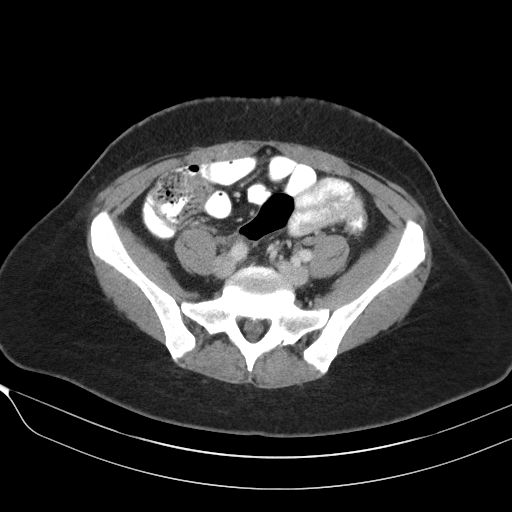
[im 50/100  soft-tissue]
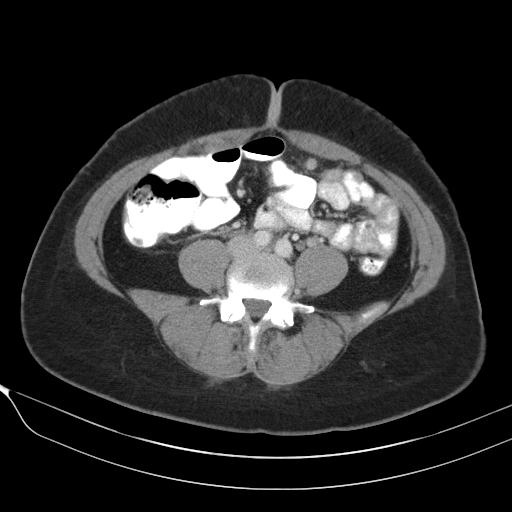
[im 57/100  soft-tissue]
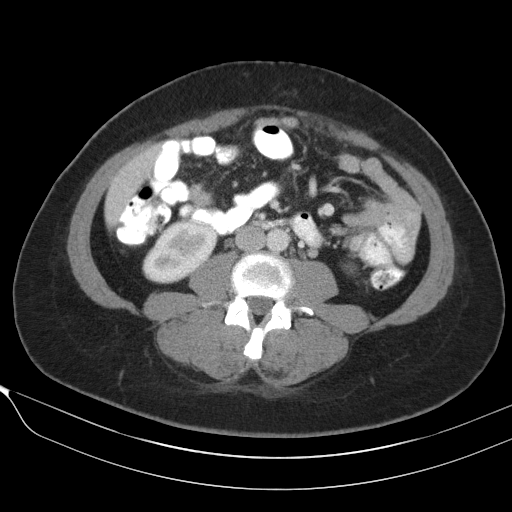
[im 64/100  soft-tissue]
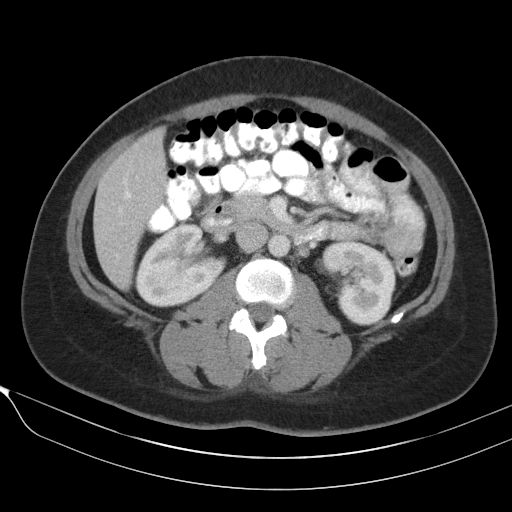
[im 64/100  bone]
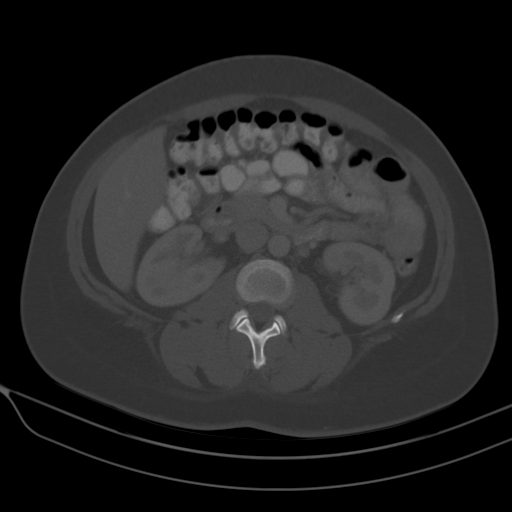
[im 71/100  soft-tissue]
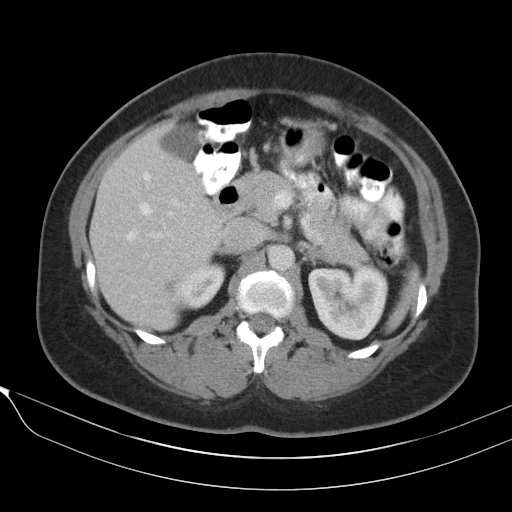
[im 71/100  lung]
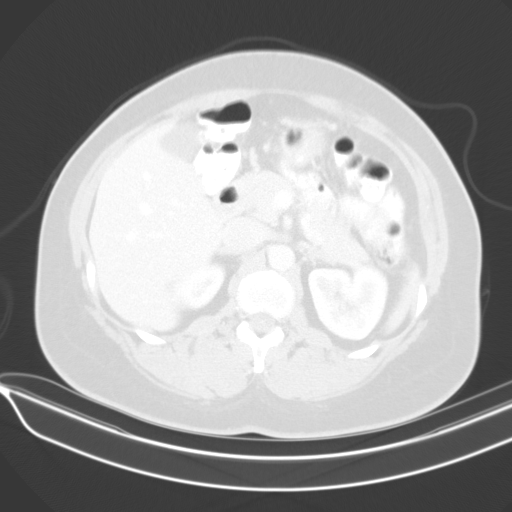
[im 78/100  soft-tissue]
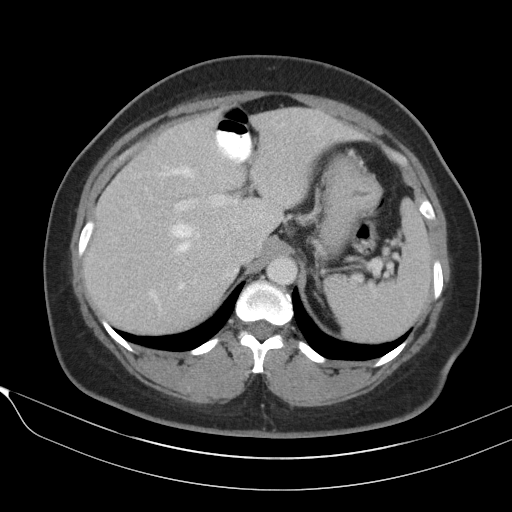
[im 78/100  lung]
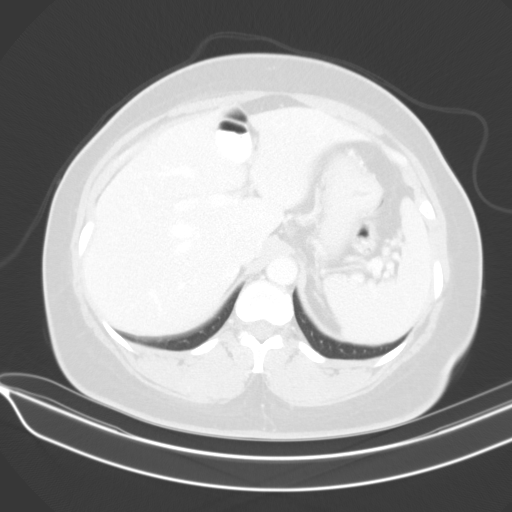
[im 85/100  soft-tissue]
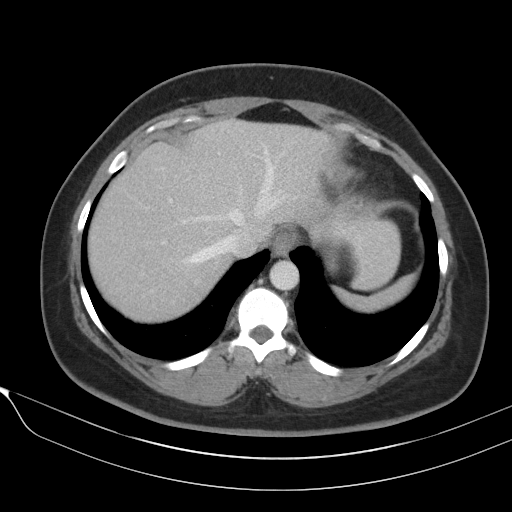
[im 85/100  lung]
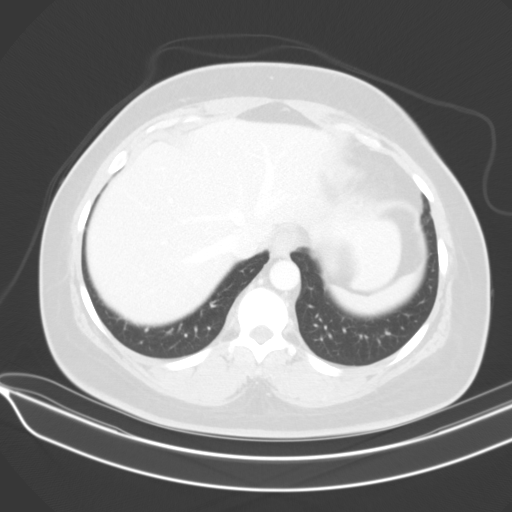
[im 92/100  soft-tissue]
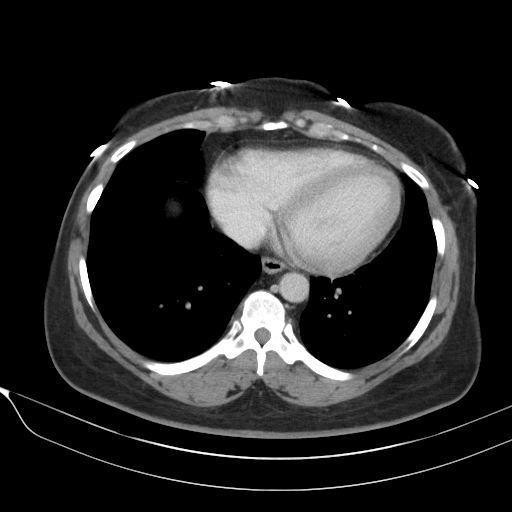
[im 92/100  lung]
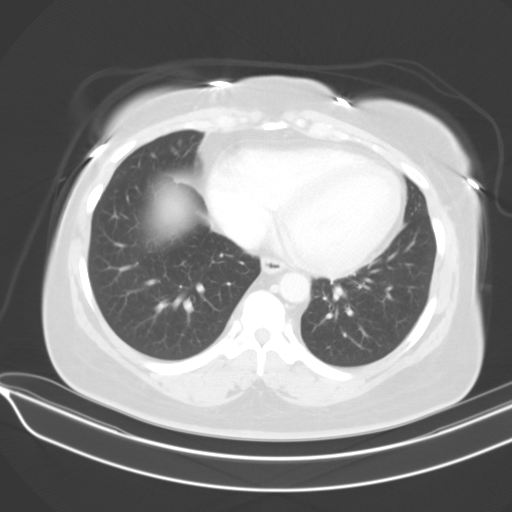

[13 of 32 positions shown; findings below may reference images not displayed]

FINDINGS: Lower chest: The visualized lung bases are clear.

No intra-abdominal free air or free fluid.

Hepatobiliary: No focal liver abnormality is seen. No gallstones,
gallbladder wall thickening, or biliary dilatation.

Pancreas: Unremarkable. No pancreatic ductal dilatation or
surrounding inflammatory changes.

Spleen: Normal in size without focal abnormality.

Adrenals/Urinary Tract: The adrenal glands unremarkable. The
kidneys, visualized ureters, and urinary bladder appear
unremarkable.

Stomach/Bowel: There is no bowel obstruction or active inflammation.
The appendix is normal.

Vascular/Lymphatic: The abdominal aorta and IVC unremarkable. No
portal venous gas. There is no adenopathy.

Reproductive: Hysterectomy.  No adnexal masses.

Other: None

Musculoskeletal: No acute or significant osseous findings.
IMPRESSION: 1. No acute intra-abdominal or pelvic pathology.
2. Hysterectomy.

## 2021-06-06 ENCOUNTER — Other Ambulatory Visit: Payer: Self-pay | Admitting: Cardiology

## 2021-06-06 DIAGNOSIS — I10 Essential (primary) hypertension: Secondary | ICD-10-CM

## 2021-07-28 ENCOUNTER — Encounter: Payer: Self-pay | Admitting: Cardiology

## 2021-07-28 ENCOUNTER — Other Ambulatory Visit: Payer: Self-pay

## 2021-07-28 ENCOUNTER — Ambulatory Visit: Payer: BC Managed Care – PPO | Admitting: Cardiology

## 2021-07-28 VITALS — BP 134/89 | HR 77 | Temp 98.4°F | Resp 17 | Ht 66.0 in | Wt 201.6 lb

## 2021-07-28 DIAGNOSIS — E7800 Pure hypercholesterolemia, unspecified: Secondary | ICD-10-CM

## 2021-07-28 DIAGNOSIS — I10 Essential (primary) hypertension: Secondary | ICD-10-CM

## 2021-07-28 DIAGNOSIS — R7303 Prediabetes: Secondary | ICD-10-CM

## 2021-07-28 DIAGNOSIS — E6609 Other obesity due to excess calories: Secondary | ICD-10-CM

## 2021-07-28 NOTE — Progress Notes (Signed)
Date:  07/28/2021   ID:  Alisha Coffey, DOB [birth year], MRN [id number]  PCP:  Carlena Hurl, PA-C  Cardiologist: Rex Kras, DO, [facility] (established care 12/18/2020) Former Cardiology Providers: Jeri Lager, APRN, FNP-C  Date: 07/28/21 Last Office Visit: 01/26/2021  Chief Complaint  Patient presents with   Hypertension   Results   Follow-up    HPI  Alisha Coffey is a 45 y.o. African-American female who presents to the office with a chief complaint of " 6 month hypertension follow-up." Patient's past medical history and cardiovascular risk factors include: Anxiety, depression, chronic migraines, marijuana use, diabetes, hypertension, obesity due to excess calorie.   She is referred to the office at the request of Tysinger, Camelia Eng, PA-C for evaluation of hypertension.  Was referred to the office back in April 2022 for management of hypertension.  Prior to establishing care systolic blood pressure would range between 160-170 mmHg.  Her medications have been uptitrated and now her home blood pressures are very well controlled.  She now presents for 89-month follow-up.  Since last visit patient denies any chest pain or shortness of breath at rest or with effort related activities.  Her home systolic blood pressures range between 767-341 mmHg and diastolic blood pressures range between 79-84 mmHg.  No recent hospitalizations or urgent care visits for cardiovascular symptoms.  Since last office visit she has undergone an exercise treadmill stress test results reviewed with her in great detail and noted below for further reference.  FUNCTIONAL STATUS: No structured exercise program or daily routine.   ALLERGIES: Allergies  Allergen Reactions   Belsomra [Suvorexant]     Didn't tolerate   Propoxyphene Itching   Valium [Diazepam] Nausea And Vomiting    MEDICATION LIST PRIOR TO VISIT: Current Meds  Medication Sig   albuterol (VENTOLIN HFA) 108 (90 Base) MCG/ACT inhaler Inhale  2 puffs into the lungs every 6 (six) hours as needed for wheezing or shortness of breath.   ALPRAZolam (XANAX) 1 MG tablet Take 1 mg by mouth daily as needed for anxiety.   Cholecalciferol (VITAMIN D) 50 MCG (2000 UT) CAPS Take 1 capsule (2,000 Units total) by mouth daily.   clindamycin (CLEOCIN T) 1 % external solution Apply topically 2 (two) times daily.   Continuous Blood Gluc Sensor (FREESTYLE LIBRE 14 DAY SENSOR) MISC 1 each by Does not apply route every 14 (fourteen) days.   doxycycline (VIBRA-TABS) 100 MG tablet Take 100 mg by mouth 2 (two) times daily.   fluticasone-salmeterol (ADVAIR DISKUS) 100-50 MCG/ACT AEPB INHALE 1 PUFF INTO THE LUNGS DAILY AS NEEDED (ASTHMA).   folic acid (FOLVITE) 937 MCG tablet Take 800 mcg by mouth daily.   gabapentin (NEURONTIN) 300 MG capsule Take 2 capsules by mouth every 8 (eight) hours as needed.   loratadine (CLARITIN) 10 MG tablet Take 10 mg by mouth daily as needed for allergies.   losartan (COZAAR) 50 MG tablet TAKE 1 TABLET BY MOUTH EVERY DAY IN THE EVENING   metroNIDAZOLE (FLAGYL) 500 MG tablet Take 1 tablet by mouth as needed.   metroNIDAZOLE (METROGEL) 0.75 % vaginal gel Place 1 application vaginally as needed.   rosuvastatin (CRESTOR) 10 MG tablet Take 1 tablet (10 mg total) by mouth daily.   triamterene-hydrochlorothiazide (MAXZIDE-25) 37.5-25 MG tablet Take 1 tablet by mouth daily.     PAST MEDICAL HISTORY: Past Medical History:  Diagnosis Date   Acne    Anxiety    Chronic headache 2018   improved after treatment for  anxiety 2018   History of sinusitis    Insomnia    Joint pain    Obesity    Uterine fibroid     PAST SURGICAL HISTORY: Past Surgical History:  Procedure Laterality Date   ABDOMINAL HYSTERECTOMY N/A 09/07/2020   Procedure: HYSTERECTOMY ABDOMINAL;  Surgeon: Sanjuana Kava, MD;  Location: [place];  Service: Gynecology;  Laterality: N/A;   BILATERAL SALPINGECTOMY Bilateral 09/07/2020   Procedure: OPEN  BILATERAL SALPINGECTOMY;  Surgeon: Sanjuana Kava, MD;  Location: [place];  Service: Gynecology;  Laterality: Bilateral;   BREAST REDUCTION SURGERY  2009, 2000   HERNIA REPAIR     WISDOM TOOTH EXTRACTION      FAMILY HISTORY: The patient family history includes Depression in her maternal grandmother; Diabetes in her maternal grandfather; Hypertension in her maternal grandfather, maternal grandmother, and mother; Kidney Stones in her mother; Stroke in her maternal aunt.  SOCIAL HISTORY:  The patient  reports that she has never smoked. She has never used smokeless tobacco. She reports current alcohol use of about 1.0 standard drink per week. She reports current drug use. Drug: Marijuana.  REVIEW OF SYSTEMS: Review of Systems  Constitutional: Negative for chills and fever.  HENT:  Negative for hoarse voice and nosebleeds.   Eyes:  Negative for discharge, double vision and pain.  Cardiovascular:  Negative for chest pain, claudication, dyspnea on exertion, leg swelling, near-syncope, orthopnea, palpitations, paroxysmal nocturnal dyspnea and syncope.  Respiratory:  Negative for hemoptysis and shortness of breath.   Musculoskeletal:  Negative for muscle cramps and myalgias.  Gastrointestinal:  Negative for abdominal pain, constipation, diarrhea, hematemesis, hematochezia, melena, nausea and vomiting.  Neurological:  Negative for dizziness and light-headedness.   PHYSICAL EXAM: Vitals with BMI 07/28/2021 03/22/2021 02/18/2021  Height 5\' 6"  - 5\' 6"   Weight 201 lbs 10 oz 191 lbs 6 oz 189 lbs 3 oz  BMI 42.87 - 68.11  Systolic 572 620 355  Diastolic 89 80 78  Pulse 77 93 91   CONSTITUTIONAL: Well-developed and well-nourished. No acute distress.  SKIN: Skin is warm and dry. No rash noted. No cyanosis. No pallor. No jaundice HEAD: Normocephalic and atraumatic.  EYES: No scleral icterus MOUTH/THROAT: Moist oral membranes.  NECK: No JVD present. No thyromegaly noted. No carotid  bruits  LYMPHATIC: No visible cervical adenopathy.  CHEST Normal respiratory effort. No intercostal retractions  LUNGS: Clear to auscultation bilaterally.  No stridor. No wheezes. No rales.  CARDIOVASCULAR: Regular, positive S1-S2, no murmurs rubs or gallops appreciated ABDOMINAL: Abdominal binder present, obese, no apparent ascites.  EXTREMITIES: No peripheral edema, 2+ dorsalis pedis and posterior tibial pulses HEMATOLOGIC: No significant bruising NEUROLOGIC: Oriented to person, place, and time. Nonfocal. Normal muscle tone.  PSYCHIATRIC: Normal mood and affect. Normal behavior. Cooperative  CARDIAC DATABASE: EKG: 07/28/2021: NSR, 76 bpm, without underlying injury pattern, nonspecific T wave abnormality.  Echocardiogram: 12/24/2020: Normal LV systolic function with visual EF 60-65%. Left ventricle cavity is normal in size. Normal global wall motion. Normal diastolic filling pattern, normal LAP. No significant valvular abnormalities. No prior study for comparison.   Stress Testing: Exercise treadmill stress test 02/19/2021: Exercise treadmill stress test performed using Bruce protocol.  Patient reached 10.1 METS, and 84% of age predicted maximum heart rate.  Exercise capacity was good.  No chest pain reported.  Normal heart rate and hemodynamic response. Stress EKG revealed no ischemic changes. Low risk study.  Heart Catheterization: None  LABORATORY DATA: CBC Latest Ref Rng & Units 02/18/2021 12/24/2020  09/08/2020  WBC 3.4 - 10.8 x10E3/uL 6.0 11.1(H) 10.5  Hemoglobin 11.1 - 15.9 g/dL 13.3 13.6 9.4(L)  Hematocrit 34.0 - 46.6 % 39.6 39.0 31.1(L)  Platelets 150 - 450 x10E3/uL 244 252 313    CMP Latest Ref Rng & Units 12/24/2020 12/18/2020 09/08/2020  Glucose 65 - 99 mg/dL 117(H) 127(H) 177(H)  BUN 6 - 24 mg/dL - 15 11  Creatinine 0.57 - 1.00 mg/dL - 0.85 0.88  Sodium 134 - 144 mmol/L - 140 134(L)  Potassium 3.5 - 5.2 mmol/L - 3.5 4.1  Chloride 96 - 106 mmol/L - 101 104  CO2 20 -  29 mmol/L - 20 17(L)  Calcium 8.7 - 10.2 mg/dL - 10.1 8.8(L)  Total Protein 6.0 - 8.5 g/dL 7.1 - -  Total Bilirubin 0.0 - 1.2 mg/dL 0.3 - -  Alkaline Phos 44 - 121 IU/L 72 - -  AST 0 - 40 IU/L 16 - -  ALT 0 - 32 IU/L 30 - -    Lipid Panel  Lab Results  Component Value Date   CHOL 177 02/18/2021   HDL 66 02/18/2021   LDLCALC 100 (H) 02/18/2021   TRIG 57 02/18/2021   CHOLHDL 2.7 02/18/2021   No components found for: NTPROBNP No results for input(s): PROBNP in the last 8760 hours. No results for input(s): TSH in the last 8760 hours.  BMP Recent Labs    09/07/20 1040 09/08/20 0420 12/18/20 1426 12/24/20 0913  NA 135 134* 140  --   K 3.7 4.1 3.5  --   CL 107 104 101  --   CO2 19* 17* 20  --   GLUCOSE 95 177* 127* 117*  BUN 13 11 15   --   CREATININE 0.84 0.88 0.85  --   CALCIUM 8.8* 8.8* 10.1  --   GFRNONAA >60 >60  --   --     HEMOGLOBIN A1C Lab Results  Component Value Date   HGBA1C 6.1 (A) 02/18/2021    IMPRESSION:    ICD-10-CM   1. Benign hypertension  I10 EKG 12-Lead    2. Prediabetes  R73.03     3. Pure hypercholesterolemia  E78.00     4. Class 1 obesity due to excess calories with serious comorbidity and body mass index (BMI) of 32.0 to 32.9 in adult  E66.09    Z68.32        RECOMMENDATIONS: Zania D Credeur is a 45 y.o. female whose past medical history and cardiac risk factors include: Anxiety, depression, chronic migraines, marijuana use, prediabetes, hyperlipidemia, hypertension, obesity due to excess calorie.   Benign essential hypertension: Office and home blood pressures within acceptable range. Can continue current medical therapy. Encouraged her to decrease the amount of salt intake and to increase physical activity as tolerated with a goal of 30 minutes a day 5 days a week. Echocardiogram results reviewed with her in great detail at today's office visit.  Findings noted above.  Prediabetes mellitus: Last hemoglobin A1c 6.5 and now 6.1  (01/2021).  Currently managed by primary care provider. Educated on importance of glycemic control.  Hyperlipidemia: LDL 203 mg/dL was and now 100mg /dL Recommend a goal LDL of 70 mg/dL or less. Started on statin therapy by her PCP Does not endorse any myalgias. Currently managed by primary care provider.  Obesity, due to excess calories: Body mass index is 32.54 kg/m. I reviewed with the patient the importance of diet, regular physical activity/exercise, weight loss.   Patient is educated on increasing physical  activity gradually as tolerated.  With the goal of moderate intensity exercise for 30 minutes a day 5 days a week.  FINAL MEDICATION LIST END OF ENCOUNTER: No orders of the defined types were placed in this encounter.   Medications Discontinued During This Encounter  Medication Reason   Ascorbic Acid (VITAMIN C) 1000 MG tablet    Cyanocobalamin (B-12) 2500 MCG TABS    OVER THE COUNTER MEDICATION    Prenatal Vit-Fe Fumarate-FA (PRENATAL PLUS VITAMIN/MINERAL) 27-1 MG TABS      Current Outpatient Medications:    albuterol (VENTOLIN HFA) 108 (90 Base) MCG/ACT inhaler, Inhale 2 puffs into the lungs every 6 (six) hours as needed for wheezing or shortness of breath., Disp: 18 g, Rfl: 0   ALPRAZolam (XANAX) 1 MG tablet, Take 1 mg by mouth daily as needed for anxiety., Disp: , Rfl:    Cholecalciferol (VITAMIN D) 50 MCG (2000 UT) CAPS, Take 1 capsule (2,000 Units total) by mouth daily., Disp: 90 capsule, Rfl: 1   clindamycin (CLEOCIN T) 1 % external solution, Apply topically 2 (two) times daily., Disp: , Rfl:    Continuous Blood Gluc Sensor (FREESTYLE LIBRE 14 DAY SENSOR) MISC, 1 each by Does not apply route every 14 (fourteen) days., Disp: 2 each, Rfl: 11   doxycycline (VIBRA-TABS) 100 MG tablet, Take 100 mg by mouth 2 (two) times daily., Disp: , Rfl:    fluticasone-salmeterol (ADVAIR DISKUS) 100-50 MCG/ACT AEPB, INHALE 1 PUFF INTO THE LUNGS DAILY AS NEEDED (ASTHMA)., Disp: 180 each,  Rfl: 1   folic acid (FOLVITE) 811 MCG tablet, Take 800 mcg by mouth daily., Disp: , Rfl:    gabapentin (NEURONTIN) 300 MG capsule, Take 2 capsules by mouth every 8 (eight) hours as needed., Disp: , Rfl:    loratadine (CLARITIN) 10 MG tablet, Take 10 mg by mouth daily as needed for allergies., Disp: , Rfl:    losartan (COZAAR) 50 MG tablet, TAKE 1 TABLET BY MOUTH EVERY DAY IN THE EVENING, Disp: 90 tablet, Rfl: 0   metroNIDAZOLE (FLAGYL) 500 MG tablet, Take 1 tablet by mouth as needed., Disp: , Rfl:    metroNIDAZOLE (METROGEL) 0.75 % vaginal gel, Place 1 application vaginally as needed., Disp: , Rfl:    rosuvastatin (CRESTOR) 10 MG tablet, Take 1 tablet (10 mg total) by mouth daily., Disp: 90 tablet, Rfl: 3   triamterene-hydrochlorothiazide (MAXZIDE-25) 37.5-25 MG tablet, Take 1 tablet by mouth daily., Disp: , Rfl:   Orders Placed This Encounter  Procedures   EKG 12-Lead    There are no Patient Instructions on file for this visit.   --Continue cardiac medications as reconciled in final medication list. --Return in about 1 year (around 07/28/2022) for Follow up blood pressure and primary prevention. Or sooner if needed. --Continue follow-up with your primary care physician regarding the management of your other chronic comorbid conditions.  Patient's questions and concerns were addressed to her satisfaction. She voices understanding of the instructions provided during this encounter.   This note was created using a voice recognition software as a result there may be grammatical errors inadvertently enclosed that do not reflect the nature of this encounter. Every attempt is made to correct such errors.  Rex Kras, [place], [facility]  Pager: [phone] Office: [phone]

## 2021-10-14 ENCOUNTER — Other Ambulatory Visit: Payer: Self-pay

## 2021-10-14 ENCOUNTER — Telehealth: Payer: BC Managed Care – PPO | Admitting: Physician Assistant

## 2021-10-14 ENCOUNTER — Encounter: Payer: Self-pay | Admitting: Physician Assistant

## 2021-10-14 VITALS — Temp 98.9°F | Ht 66.0 in | Wt 200.0 lb

## 2021-10-14 DIAGNOSIS — H579 Unspecified disorder of eye and adnexa: Secondary | ICD-10-CM

## 2021-10-14 DIAGNOSIS — J301 Allergic rhinitis due to pollen: Secondary | ICD-10-CM

## 2021-10-14 DIAGNOSIS — J4520 Mild intermittent asthma, uncomplicated: Secondary | ICD-10-CM | POA: Diagnosis not present

## 2021-10-14 DIAGNOSIS — E559 Vitamin D deficiency, unspecified: Secondary | ICD-10-CM | POA: Diagnosis not present

## 2021-10-14 MED ORDER — VITAMIN D 50 MCG (2000 UT) PO CAPS: 1.0000 | ORAL_CAPSULE | Freq: Every day | ORAL | 1 refills | Status: DC

## 2021-10-14 MED ORDER — FLUTICASONE PROPIONATE 50 MCG/ACT NA SUSP: 2.0000 | Freq: Every day | NASAL | 6 refills | Status: DC

## 2021-10-14 MED ORDER — ALBUTEROL SULFATE HFA 108 (90 BASE) MCG/ACT IN AERS: 2.0000 | INHALATION_SPRAY | Freq: Four times a day (QID) | RESPIRATORY_TRACT | 1 refills | Status: DC | PRN

## 2021-10-14 MED ORDER — CETIRIZINE HCL 10 MG PO TABS: 10.0000 mg | ORAL_TABLET | Freq: Every evening | ORAL | 1 refills | Status: DC | PRN

## 2021-10-14 MED ORDER — OLOPATADINE HCL 0.1 % OP SOLN: 1.0000 [drp] | Freq: Two times a day (BID) | OPHTHALMIC | 3 refills | Status: DC

## 2021-10-14 NOTE — Progress Notes (Signed)
°  Virtual Visit via Video Note   Patient ID: Alisha Coffey, female    DOB: [birth year], 46 y.o.   MRN: [id number]  I connected withNAME@ on 10/14/21 by a video enabled telemedicine application and verified that I am speaking with the correct person using two identifiers.  Location: Patient: home Provider: office   I discussed the limitations of evaluation and management by telemedicine and the availability of in person appointments. The patient expressed understanding and agreed to proceed.  History of Present Illness:  Chief Complaint  Patient presents with   other    SOB, ST, runny nose, sneezing, started about 3 days ago, slight cough, sweat and night time, ears are hurting,    Patient reports a 4 day history of not feeling well with a cough that caused her to use her albuterol inhaler 4 days ago, a sore throat that started 3 days ago after the cough, then sneezing yesterday; also has a runny nose, itchy, watery eyes, some facial pressure; tried Mucinex DM and Claritin which were of minimal help; Denies sick contacts or travel outside of the country;  Denies fever / chills / nausea / vomiting / diarrhea / constipation.   Requests refills of albuterol mdi and vitamin d.   Observations/Objective:  Temp 98.9 F (37.2 C)    Ht 5\' 6"  (1.676 m)    Wt 200 lb (90.7 kg)    LMP 11/12/2018 (Exact Date)    BMI 32.28 kg/m     Alisha Coffey was seen today for other.  Diagnoses and all orders for this visit:  Seasonal allergic rhinitis due to pollen -     olopatadine (PATANOL) 0.1 % ophthalmic solution; Place 1 drop into both eyes 2 (two) times daily. -     cetirizine (ZYRTEC) 10 MG tablet; Take 1 tablet (10 mg total) by mouth at bedtime as needed for allergies. -     fluticasone (FLONASE) 50 MCG/ACT nasal spray; Place 2 sprays into both nostrils daily.  Mild intermittent asthma without complication -     albuterol (VENTOLIN HFA) 108 (90 Base) MCG/ACT inhaler; Inhale 2 puffs into the lungs every 6  (six) hours as needed for wheezing or shortness of breath.  Vitamin D deficiency -     Cholecalciferol (VITAMIN D) 50 MCG (2000 UT) CAPS; Take 1 capsule (2,000 Units total) by mouth daily.  Allergic eye reaction -     olopatadine (PATANOL) 0.1 % ophthalmic solution; Place 1 drop into both eyes 2 (two) times daily.    Follow up: as needed; if no improvement with allergic rhinitis can consider referral to Allergist; is symptoms get worse go to Urgent Care of Emergency Department for further evaluation.    I discussed the assessment and treatment plan with the patient. The patient was provided an opportunity to ask questions and all were answered. The patient agreed with the plan and demonstrated an understanding of the instructions.   The patient was advised to call back or seek an in-person evaluation if the symptoms worsen or if the condition fails to improve as anticipated.  I spent 12 minutes dedicated to the care of this patient, including pre-visit review of records, face to face time, post-visit ordering of testing and documentation.    Irene Pap, PA-C

## 2021-10-18 ENCOUNTER — Other Ambulatory Visit: Payer: Self-pay

## 2021-10-18 ENCOUNTER — Ambulatory Visit: Payer: BC Managed Care – PPO | Admitting: Medical

## 2021-10-18 ENCOUNTER — Telehealth: Payer: Self-pay | Admitting: Medical

## 2021-10-18 ENCOUNTER — Other Ambulatory Visit: Payer: Self-pay | Admitting: Medical

## 2021-10-18 VITALS — BP 120/68 | HR 85 | Temp 98.3°F | Resp 18 | Wt 203.2 lb

## 2021-10-18 DIAGNOSIS — J4520 Mild intermittent asthma, uncomplicated: Secondary | ICD-10-CM

## 2021-10-18 DIAGNOSIS — J45909 Unspecified asthma, uncomplicated: Secondary | ICD-10-CM | POA: Insufficient documentation

## 2021-10-18 DIAGNOSIS — R062 Wheezing: Secondary | ICD-10-CM | POA: Insufficient documentation

## 2021-10-18 DIAGNOSIS — R051 Acute cough: Secondary | ICD-10-CM | POA: Diagnosis not present

## 2021-10-18 DIAGNOSIS — J4541 Moderate persistent asthma with (acute) exacerbation: Secondary | ICD-10-CM | POA: Diagnosis not present

## 2021-10-18 MED ORDER — MOMETASONE FURO-FORMOTEROL FUM 200-5 MCG/ACT IN AERO: 2.0000 | INHALATION_SPRAY | Freq: Two times a day (BID) | RESPIRATORY_TRACT | 2 refills | Status: DC

## 2021-10-18 MED ORDER — ALBUTEROL SULFATE (2.5 MG/3ML) 0.083% IN NEBU
2.5000 mg | INHALATION_SOLUTION | Freq: Once | RESPIRATORY_TRACT | Status: AC
  Administered 2021-10-18: 2.5 mg via RESPIRATORY_TRACT

## 2021-10-18 MED ORDER — METHYLPREDNISOLONE ACETATE 80 MG/ML IJ SUSP
80.0000 mg | Freq: Once | INTRAMUSCULAR | Status: AC
  Administered 2021-10-18: 60 mg via INTRAMUSCULAR

## 2021-10-18 MED ORDER — FREESTYLE LIBRE 14 DAY SENSOR MISC: 1.0000 | 3 refills | Status: DC

## 2021-10-18 MED ORDER — ALBUTEROL SULFATE HFA 108 (90 BASE) MCG/ACT IN AERS: 2.0000 | INHALATION_SPRAY | Freq: Four times a day (QID) | RESPIRATORY_TRACT | 0 refills | Status: DC | PRN

## 2021-10-18 NOTE — Progress Notes (Signed)
Subjective:  Alisha Coffey is a 46 y.o. female who presents for Chief Complaint  Patient presents with   still sick    Still sick, itchy eyes, SOB- having to use inhaler a lot, short winded. Had to sit down, cough. Alittle yellow/clear mucous, having trouble taking deep breaths     Here for dyspnea.  She had a virtual last week with Barney Drain, PA here on February 16  Symptoms began 6 to 7 days ago with cough itchy eyes, watery eyes, runny nose, but over the weekend became much more short of breath and wheezy.  She feels like it is allergy related.  She never had to use inhaler until 2020 when she got COVID.  No long history of asthma.  Lifetime non-smoker.  However in the last 2 years she can get wheezy and short of breath with illness or allergies.  In the past few days she still has sneezing, itchy and watery eyes, sob, wheezing, congestion, trouble taking deep breath.  Never had fever.  No headache. Was just cough 4-5 days ago, but tightness and SOB started few days ago.  Cant go up steps all the way without sitting Awoke SOB this morning  She was outside at the park Valentine's Day week and thinks maybe the pollen got her flared up.  She was looking at flowers and out and about outside  No other aggravating or relieving factors.    No other c/o.   Past Medical History:  Diagnosis Date   Acne    Anxiety    Chronic headache 2018   improved after treatment for anxiety 2018   History of sinusitis    Insomnia    Joint pain    Obesity    Uterine fibroid    Current Outpatient Medications on File Prior to Visit  Medication Sig Dispense Refill   albuterol (VENTOLIN HFA) 108 (90 Base) MCG/ACT inhaler Inhale 2 puffs into the lungs every 6 (six) hours as needed for wheezing or shortness of breath. 18 g 1   ALPRAZolam (XANAX) 1 MG tablet Take 1 mg by mouth daily as needed for anxiety.     cetirizine (ZYRTEC) 10 MG tablet Take 1 tablet (10 mg total) by mouth at bedtime as needed for  allergies. 90 tablet 1   Cholecalciferol (VITAMIN D) 50 MCG (2000 UT) CAPS Take 1 capsule (2,000 Units total) by mouth daily. 90 capsule 1   Continuous Blood Gluc Sensor (FREESTYLE LIBRE 14 DAY SENSOR) MISC 1 each by Does not apply route every 14 (fourteen) days. 2 each 11   fluticasone (FLONASE) 50 MCG/ACT nasal spray Place 2 sprays into both nostrils daily. 16 g 6   fluticasone-salmeterol (ADVAIR DISKUS) 100-50 MCG/ACT AEPB INHALE 1 PUFF INTO THE LUNGS DAILY AS NEEDED (ASTHMA). 725 each 1   folic acid (FOLVITE) 366 MCG tablet Take 800 mcg by mouth daily.     losartan (COZAAR) 50 MG tablet TAKE 1 TABLET BY MOUTH EVERY DAY IN THE EVENING 90 tablet 0   olopatadine (PATANOL) 0.1 % ophthalmic solution Place 1 drop into both eyes 2 (two) times daily. 5 mL 3   rosuvastatin (CRESTOR) 10 MG tablet Take 1 tablet (10 mg total) by mouth daily. 90 tablet 3   triamterene-hydrochlorothiazide (MAXZIDE-25) 37.5-25 MG tablet Take 1 tablet by mouth daily.     No current facility-administered medications on file prior to visit.     The following portions of the patient's history were reviewed and updated as appropriate: allergies,  current medications, past family history, past medical history, past social history, past surgical history and problem list.  ROS Otherwise as in subjective above    Objective: BP 120/68    Pulse 85    Temp 98.3 F (36.8 C)    Resp 18    Wt 203 lb 3.2 oz (92.2 kg)    LMP 11/12/2018 (Exact Date)    SpO2 95%    BMI 32.80 kg/m   General appearance: alert, no distress, well developed, well nourished HEENT: normocephalic, sclerae anicteric, conjunctiva pink and moist, TMs pearly, nares patent, but some turbinate edema, no discharge or erythema, pharynx normal Oral cavity: Somewhat dryness of the mucous membranes, no erythema or lesions neck: supple, no lymphadenopathy, no thyromegaly, no masses Heart: RRR, normal S1, S2, no murmurs Lungs: Scattered wheezes, otherwise no dullness,  no, rhonchi, or rales Pulses: 2+ radial pulses, 2+ pedal pulses, normal cap refill Ext: no edema   Assessment: Encounter Diagnoses  Name Primary?   Moderate persistent extrinsic asthma with acute exacerbation Yes   Wheezing    Acute cough    Mild intermittent asthma without complication      Plan: Symptoms and exam suggest allergic asthma.  Given her acute wheezing and symptoms and exam we did a round of albuterol nebulized in house today.  She saw considerable improvement with this.  Deprol Medrol 60mg  IM given in office today  She just started back on Zyrtec last week so continue this through the end of May at least [place].  She did not feel like Advair helped in the past year.  She has not had problems until the last 2 weeks.  Discussed proper use of medication Continue albuterol as needed for rescue inhaler Continue Flonase and allergy eyedrops as needed  Call back within the next 2 to 3 days to give me an update on symptoms  Acsa was seen today for still sick.  Diagnoses and all orders for this visit:  Moderate persistent extrinsic asthma with acute exacerbation -     methylPREDNISolone acetate (DEPO-MEDROL) injection 80 mg -     albuterol (PROVENTIL) (2.5 MG/3ML) 0.083% nebulizer solution 2.5 mg  Wheezing -     methylPREDNISolone acetate (DEPO-MEDROL) injection 80 mg -     albuterol (PROVENTIL) (2.5 MG/3ML) 0.083% nebulizer solution 2.5 mg  Acute cough -     methylPREDNISolone acetate (DEPO-MEDROL) injection 80 mg -     albuterol (PROVENTIL) (2.5 MG/3ML) 0.083% nebulizer solution 2.5 mg  Mild intermittent asthma without complication -     methylPREDNISolone acetate (DEPO-MEDROL) injection 80 mg -     albuterol (PROVENTIL) (2.5 MG/3ML) 0.083% nebulizer solution 2.5 mg  Other orders -     mometasone-formoterol (DULERA) 200-5 MCG/ACT AERO; Inhale 2 puffs into the lungs 2 (two) times daily.    Follow up: call report 2-3 days

## 2021-10-18 NOTE — Telephone Encounter (Signed)
Pt called back and stated that the inhaler and the freestyle Elenor Legato will be cheaper if it is written for a 90 day supply instead of 1 month. She wanted to see if you could change that

## 2021-10-18 NOTE — Patient Instructions (Signed)
Your symptoms and exam suggest allergic asthma  Recommendations: Begin Dulera preventative inhaler 2 puffs twice daily.  You will need to do this for the next 2 weeks.  After 2 weeks if not having a lot of problems you can try going off of this.  However if you continue to have symptoms you may need to stay on this through April or May until after spring allergy season is over Continue Zyrtec daily allergy pill for the next few months through allergy spring season You can use the albuterol rescue inhaler 2 puffs every 4-6 hours for shortness of breath, wheezing, tightness in chest We gave you a round of albuterol nebulized therapy and steroid shot today to help reduce inflammation and to open up the lungs Hydrate well with water throughout the day.  When you are breathing faster and more frequently you tend to get rid of more water vapor drying you out, which can cause some dehydration Continue allergy eyedrops and Flonase nasal spray as needed If you do not feel much improved within the next week then recheck Regardless, call back within a few days let me know that you are improving

## 2021-12-13 ENCOUNTER — Other Ambulatory Visit: Payer: Self-pay | Admitting: Cardiology

## 2021-12-13 DIAGNOSIS — I10 Essential (primary) hypertension: Secondary | ICD-10-CM

## 2022-03-19 ENCOUNTER — Other Ambulatory Visit: Payer: Self-pay | Admitting: Medical

## 2022-03-24 ENCOUNTER — Ambulatory Visit: Payer: BC Managed Care – PPO | Admitting: Medical

## 2022-03-24 ENCOUNTER — Encounter: Payer: Self-pay | Admitting: Medical

## 2022-03-24 VITALS — BP 120/80 | HR 79 | Wt 204.2 lb

## 2022-03-24 DIAGNOSIS — Z6832 Body mass index (BMI) 32.0-32.9, adult: Secondary | ICD-10-CM | POA: Insufficient documentation

## 2022-03-24 DIAGNOSIS — I10 Essential (primary) hypertension: Secondary | ICD-10-CM | POA: Insufficient documentation

## 2022-03-24 DIAGNOSIS — E785 Hyperlipidemia, unspecified: Secondary | ICD-10-CM

## 2022-03-24 DIAGNOSIS — R7301 Impaired fasting glucose: Secondary | ICD-10-CM | POA: Diagnosis not present

## 2022-03-24 DIAGNOSIS — L0293 Carbuncle, unspecified: Secondary | ICD-10-CM

## 2022-03-24 DIAGNOSIS — E559 Vitamin D deficiency, unspecified: Secondary | ICD-10-CM | POA: Diagnosis not present

## 2022-03-24 MED ORDER — LOSARTAN POTASSIUM 50 MG PO TABS: ORAL_TABLET | ORAL | 0 refills | Status: DC

## 2022-03-24 MED ORDER — WEGOVY 0.25 MG/0.5ML ~~LOC~~ SOAJ
0.2500 mg | SUBCUTANEOUS | 0 refills | Status: DC
Start: 2022-03-24 — End: 2022-03-28

## 2022-03-24 MED ORDER — MUPIROCIN 2 % EX OINT: 1.0000 | TOPICAL_OINTMENT | Freq: Three times a day (TID) | CUTANEOUS | 1 refills | Status: DC

## 2022-03-24 MED ORDER — TRIAMTERENE-HCTZ 37.5-25 MG PO TABS
1.0000 | ORAL_TABLET | Freq: Every day | ORAL | 0 refills | Status: DC
Start: 2022-03-24 — End: 2022-05-20

## 2022-03-24 MED ORDER — CHLORHEXIDINE GLUCONATE 4 % EX LIQD
CUTANEOUS | 0 refills | Status: DC
Start: 2022-03-24 — End: 2022-05-19

## 2022-03-24 MED ORDER — ROSUVASTATIN CALCIUM 10 MG PO TABS
10.0000 mg | ORAL_TABLET | Freq: Every day | ORAL | 0 refills | Status: DC
Start: 2022-03-24 — End: 2022-05-20

## 2022-03-24 MED ORDER — WEGOVY 0.25 MG/0.5ML ~~LOC~~ SOAJ: 0.2500 mg | SUBCUTANEOUS | 0 refills | Status: DC

## 2022-03-24 MED ORDER — SULFAMETHOXAZOLE-TRIMETHOPRIM 800-160 MG PO TABS: 1.0000 | ORAL_TABLET | Freq: Two times a day (BID) | ORAL | 0 refills | Status: DC

## 2022-03-24 NOTE — Progress Notes (Signed)
Subjective:  Alisha Coffey is a 46 y.o. female who presents for Chief Complaint  Patient presents with   Medication Refill    [name]. wants to see if she can get something for recurrent boils. Wants to see if she can get back on iron medication.     Here for several concerns.  She has been feeling fatigued.  She thinks it could be stress related think she should go back on iron.  She was on iron in the past couple years when she was quite anemic.  Since then that she had a hysterectomy.  She denies any other blood loss or bruising.  She has had a lot of work stress.  She works in Science [employer] and recently things have gotten way worse.  She is currently on leave of absence.  She is seeing psychiatry and counseling.  Sees Dr. Buddy Coffey  Vitamin D deficiency-she is taking vitamin D supplements.  She would like to go back on something for weight loss.  We had tried some in the past but she decided not to use it.  The Rybelsus pill had a bad taste in her mouth.  She has concerns about recurrent boils.  She gets recurrent boils in her armpits and upper inner thigh and sometimes in the pubic region.  She has some areas inflamed in her left armpit and pubic region today.  She has an app on her phone notes that she possibly could have sleep apnea.  She does snore.  She is taking B12 and folate supplement but they are not helping the fatigue.  No other aggravating or relieving factors.    No other c/o.  Past Medical History:  Diagnosis Date   Acne    Anxiety    Chronic headache 2018   improved after treatment for anxiety 2018   History of sinusitis    Insomnia    Joint pain    Obesity    Uterine fibroid    Current Outpatient Medications on File Prior to Visit  Medication Sig Dispense Refill   albuterol (VENTOLIN HFA) 108 (90 Base) MCG/ACT inhaler Inhale 2 puffs into the lungs every 6 (six) hours as needed for wheezing or shortness of breath. 3 each 0   ALPRAZolam (XANAX) 1 MG tablet Take 1  mg by mouth daily as needed for anxiety.     cetirizine (ZYRTEC) 10 MG tablet Take 1 tablet (10 mg total) by mouth at bedtime as needed for allergies. 90 tablet 1   Cholecalciferol (VITAMIN D) 50 MCG (2000 UT) CAPS Take 1 capsule (2,000 Units total) by mouth daily. 90 capsule 1   Continuous Blood Gluc Sensor (FREESTYLE LIBRE 14 DAY SENSOR) MISC 1 each by Does not apply route every 14 (fourteen) days. 6 each 3   cyanocobalamin (VITAMIN B12) 1000 MCG tablet Take 1,000 mcg by mouth daily.     fluticasone (FLONASE) 50 MCG/ACT nasal spray Place 2 sprays into both nostrils daily. 16 g 6   folic acid (FOLVITE) 379 MCG tablet Take 800 mcg by mouth daily.     olopatadine (PATANOL) 0.1 % ophthalmic solution Place 1 drop into both eyes 2 (two) times daily. 5 mL 3   Probiotic Product (UP4 PROBIOTICS WOMENS PO) Take by mouth.     mometasone-formoterol (DULERA) 200-5 MCG/ACT AERO Inhale 2 puffs into the lungs 2 (two) times daily. (Patient not taking: Reported on 03/24/2022) 3 each 2   No current facility-administered medications on file prior to visit.    The following  portions of the patient's history were reviewed and updated as appropriate: allergies, current medications, past family history, past medical history, past social history, past surgical history and problem list.  ROS Otherwise as in subjective above    Objective: BP 120/80   Pulse 79   Wt 204 lb 3.2 oz (92.6 kg)   LMP 11/12/2018 (Exact Date)   SpO2 97%   BMI 32.96 kg/m   General appearance: alert, no distress, well developed, well nourished Skin: There are some scarring in bilateral axilla from prior hidradenitis, no active lesions although she has a few little sweat glands that feel little inflamed in the left axilla, she has a 1 cm raised tender lesion in the right pubic area but no fluctuance induration or erythema.  Exam chaperoned by nurse. Neck: supple, no lymphadenopathy, no thyromegaly, no masses Heart: RRR, normal S1, S2, no  murmurs Lungs: CTA bilaterally, no wheezes, rhonchi, or rales Pulses: 2+ radial pulses, 2+ pedal pulses, normal cap refill Ext: no edema   Assessment: Encounter Diagnoses  Name Primary?   Recurrent boils Yes   Impaired fasting blood sugar    Benign hypertension    Vitamin D deficiency    Hyperlipidemia, unspecified hyperlipidemia type    BMI 32.0-32.9,adult      Plan: Recurrent boils-advise she try whole 30 eating plan and cut out sugar completely as this could help.  Continue efforts to lose weight and eat healthy low sugar low-fat diet.  Begin Bactrim oral antibiotic, begin mupirocin ointment intranasally and Hibiclens body wash.  Can do the body wash Hibiclens.  If not seeing improvements over the next few months consider dermatology consult.  Impaired glucose, obesity-begin trial of Wegovy.  Counseled on diet and exercise and discussed risk and benefits and proper use of medication.  BMI greater than 30-same plan as impaired glucose  Vitamin D deficiency-continue supplement  Hypertension-continue current medication  Hyperlipidemia-continue current medication  Alisha Coffey was seen today for medication refill.  Diagnoses and all orders for this visit:  Recurrent boils  Impaired fasting blood sugar  Benign hypertension -     losartan (COZAAR) 50 MG tablet; TAKE 1 TABLET BY MOUTH EVERY DAY IN THE EVENING  Vitamin D deficiency  Hyperlipidemia, unspecified hyperlipidemia type  BMI 32.0-32.9,adult  Other orders -     Discontinue: Semaglutide-Weight Management (WEGOVY) 0.25 MG/0.5ML SOAJ; Inject 0.25 mg into the skin once a week. -     sulfamethoxazole-trimethoprim (BACTRIM DS) 800-160 MG tablet; Take 1 tablet by mouth 2 (two) times daily. -     mupirocin ointment (BACTROBAN) 2 %; Apply 1 Application topically 3 (three) times daily. -     Semaglutide-Weight Management (WEGOVY) 0.25 MG/0.5ML SOAJ; Inject 0.25 mg into the skin once a week. -      triamterene-hydrochlorothiazide (MAXZIDE-25) 37.5-25 MG tablet; Take 1 tablet by mouth daily. -     rosuvastatin (CRESTOR) 10 MG tablet; Take 1 tablet (10 mg total) by mouth daily. -     chlorhexidine (HIBICLENS) 4 % external liquid; Apply topically once a week.    Follow up: in next 45 days as scheduled for physical

## 2022-03-25 ENCOUNTER — Telehealth: Payer: Self-pay | Admitting: Medical

## 2022-03-25 NOTE — Telephone Encounter (Signed)
P.A. WEGOVY 

## 2022-03-28 MED ORDER — WEGOVY 0.25 MG/0.5ML ~~LOC~~ SOAJ: 0.2500 mg | SUBCUTANEOUS | 0 refills | Status: DC

## 2022-03-28 NOTE — Addendum Note (Signed)
Addended by: Minette Headland A on: 03/28/2022 07:38 AM   Modules accepted: Orders

## 2022-04-08 NOTE — Telephone Encounter (Signed)
P.A. approved, left message about approval, discount card and national shortage

## 2022-04-10 ENCOUNTER — Ambulatory Visit (HOSPITAL_COMMUNITY)
Admission: EM | Admit: 2022-04-10 | Discharge: 2022-04-10 | Disposition: A | Discharge status: Home / Self Care | Payer: BC Managed Care – PPO | Attending: Internal Medicine | Admitting: Internal Medicine

## 2022-04-10 ENCOUNTER — Encounter (HOSPITAL_COMMUNITY): Payer: Self-pay

## 2022-04-10 DIAGNOSIS — H5789 Other specified disorders of eye and adnexa: Secondary | ICD-10-CM | POA: Diagnosis not present

## 2022-04-10 DIAGNOSIS — R519 Headache, unspecified: Secondary | ICD-10-CM | POA: Diagnosis not present

## 2022-04-10 MED ORDER — TETRACAINE HCL 0.5 % OP SOLN
OPHTHALMIC | Status: AC
  Filled 2022-04-10: qty 4

## 2022-04-10 MED ORDER — ERYTHROMYCIN 5 MG/GM OP OINT: TOPICAL_OINTMENT | OPHTHALMIC | 0 refills | Status: DC

## 2022-04-10 MED ORDER — FLUORESCEIN SODIUM 1 MG OP STRP
ORAL_STRIP | OPHTHALMIC | Status: AC
  Filled 2022-04-10: qty 1

## 2022-04-10 NOTE — ED Triage Notes (Signed)
Pt presents to urgent care for right eye tenderness and red. Pt report she taking Motrin for pain.

## 2022-04-10 NOTE — Discharge Instructions (Signed)
You have been prescribed an eye ointment.  Please go to [facility] at provided address tomorrow at 8 AM for further evaluation and management by ophthalmology.

## 2022-04-10 NOTE — ED Provider Notes (Addendum)
[facility]    CSN: [id number] Arrival date & time: 04/10/22  1456      History   Chief Complaint Chief Complaint  Patient presents with   Eye Pain   Facial Pain    HPI Alisha Coffey is a 46 y.o. female.   Patient presents with right eye irritation and right-sided facial pain that started a few days prior.  Patient denies any trauma or foreign body to the eye.  Denies any associated nasal congestion or runny nose.  Denies pain in extraocular movements.  Patient does not wear contacts but does wear glasses.  Denies any changes to baseline vision.  Patient denies any drainage from the eye.  Patient has redness in the eye on the right lower portion.  Patient reports facial pain surrounding eye as well.   Eye Pain    Past Medical History:  Diagnosis Date   Acne    Anxiety    Chronic headache 2018   improved after treatment for anxiety 2018   History of sinusitis    Insomnia    Joint pain    Obesity    Uterine fibroid     Patient Active Problem List   Diagnosis Date Noted   Recurrent boils 03/24/2022   Benign hypertension 03/24/2022   BMI 32.0-32.9,adult 03/24/2022   Extrinsic asthma 10/18/2021   Wheezing 10/18/2021   Acute cough 10/18/2021   Vitamin D deficiency 10/14/2021   Leucocytosis 02/18/2021   Elevated glucose 02/18/2021   Hyperlipidemia 02/18/2021   Primary hypertension 02/18/2021   BMI 30.0-30.9,adult 02/18/2021   History of uterine fibroid 12/23/2020   Allergic rhinitis due to pollen 12/23/2020   Screening for lipid disorders 12/23/2020   Impaired fasting blood sugar 12/23/2020   Encounter for hepatitis C screening test for low risk patient 00/17/4944   Folliculitis 96/75/9163   History of hysterectomy 08/2020   Iron deficiency anemia 09/30/2019   Anxiety 08/20/2019   Mild intermittent asthma 11/22/2018   Polyarthralgia 04/20/2018   Encounter for health maintenance examination in adult 11/24/2017   Vaccine counseling 11/24/2017    Routine general medical examination at a health care facility 10/04/2011   Acne 10/04/2011   Chronic headaches 10/04/2011    Past Surgical History:  Procedure Laterality Date   ABDOMINAL HYSTERECTOMY N/A 09/07/2020   Procedure: HYSTERECTOMY ABDOMINAL;  Surgeon: Sanjuana Kava, MD;  Location: [place];  Service: Gynecology;  Laterality: N/A;   BILATERAL SALPINGECTOMY Bilateral 09/07/2020   Procedure: OPEN BILATERAL SALPINGECTOMY;  Surgeon: Sanjuana Kava, MD;  Location: [place];  Service: Gynecology;  Laterality: Bilateral;   BREAST REDUCTION SURGERY  2009, 2000   HERNIA REPAIR     WISDOM TOOTH EXTRACTION      OB History   No obstetric history on file.      Home Medications    Prior to Admission medications   Medication Sig Start Date End Date Taking? Authorizing Provider  erythromycin ophthalmic ointment Place a 1/2 inch ribbon of ointment into the lower eyelid 4 times daily. 04/10/22  Yes Cherril Hett, Michele Rockers, FNP  albuterol (VENTOLIN HFA) 108 (90 Base) MCG/ACT inhaler Inhale 2 puffs into the lungs every 6 (six) hours as needed for wheezing or shortness of breath. 10/18/21   Tysinger, Camelia Eng, PA-C  ALPRAZolam Duanne Moron) 1 MG tablet Take 1 mg by mouth daily as needed for anxiety.    [provider]  cetirizine (ZYRTEC) 10 MG tablet Take 1 tablet (10 mg total) by mouth at bedtime  as needed for allergies. 10/14/21   Irene Pap, PA-C  chlorhexidine (HIBICLENS) 4 % external liquid Apply topically once a week. 03/24/22   Tysinger, Camelia Eng, PA-C  Cholecalciferol (VITAMIN D) 50 MCG (2000 UT) CAPS Take 1 capsule (2,000 Units total) by mouth daily. 10/14/21   Irene Pap, PA-C  Continuous Blood Gluc Sensor (FREESTYLE LIBRE 14 DAY SENSOR) MISC 1 each by Does not apply route every 14 (fourteen) days. 10/18/21   Tysinger, Camelia Eng, PA-C  cyanocobalamin (VITAMIN B12) 1000 MCG tablet Take 1,000 mcg by mouth daily.    [provider]  fluticasone  (FLONASE) 50 MCG/ACT nasal spray Place 2 sprays into both nostrils daily. 10/14/21   Irene Pap, PA-C  folic acid (FOLVITE) 956 MCG tablet Take 800 mcg by mouth daily.    [provider]  losartan (COZAAR) 50 MG tablet TAKE 1 TABLET BY MOUTH EVERY DAY IN THE EVENING 03/24/22   Tysinger, Camelia Eng, PA-C  mometasone-formoterol (DULERA) 200-5 MCG/ACT AERO Inhale 2 puffs into the lungs 2 (two) times daily. Patient not taking: Reported on 03/24/2022 10/18/21   Tysinger, Camelia Eng, PA-C  mupirocin ointment (BACTROBAN) 2 % Apply 1 Application topically 3 (three) times daily. 03/24/22   Tysinger, Camelia Eng, PA-C  olopatadine (PATANOL) 0.1 % ophthalmic solution Place 1 drop into both eyes 2 (two) times daily. 10/14/21   Irene Pap, PA-C  Probiotic Product (UP4 PROBIOTICS WOMENS PO) Take by mouth.    [provider]  rosuvastatin (CRESTOR) 10 MG tablet Take 1 tablet (10 mg total) by mouth daily. 03/24/22   Tysinger, Camelia Eng, PA-C  Semaglutide-Weight Management (WEGOVY) 0.25 MG/0.5ML SOAJ Inject 0.25 mg into the skin once a week. 03/28/22   Tysinger, Camelia Eng, PA-C  sulfamethoxazole-trimethoprim (BACTRIM DS) 800-160 MG tablet Take 1 tablet by mouth 2 (two) times daily. 03/24/22   Tysinger, Camelia Eng, PA-C  triamterene-hydrochlorothiazide (MAXZIDE-25) 37.5-25 MG tablet Take 1 tablet by mouth daily. 03/24/22   Tysinger, Camelia Eng, PA-C    Family History Family History  Problem Relation Age of Onset   Hypertension Mother    Kidney Stones Mother    Stroke Maternal Aunt    Depression Maternal Grandmother    Hypertension Maternal Grandmother    Diabetes Maternal Grandfather    Hypertension Maternal Grandfather    Cancer Neg Hx    Heart disease Neg Hx     Social History Social History   Tobacco Use   Smoking status: Never   Smokeless tobacco: Never  Vaping Use   Vaping Use: Never used  Substance Use Topics   Alcohol use: Yes    Alcohol/week: 1.0 standard drink of alcohol    Types: 1  Glasses of wine per week    Comment: occasional   Drug use: Yes    Types: Marijuana    Comment: OCC     Allergies   Belsomra [suvorexant], Propoxyphene, and Valium [diazepam]   Review of Systems Review of Systems Per HPI  Physical Exam Triage Vital Signs ED Triage Vitals [04/10/22 1607]  Enc Vitals Group     BP (!) 152/89     Pulse Rate 72     Resp 18     Temp 98.3 F (36.8 C)     Temp Source Oral     SpO2 99 %     Weight      Height      Head Circumference      Peak Flow  Pain Score      Pain Loc      Pain Edu?      Excl. in [place]?    No data found.  Updated Vital Signs BP (!) 152/89 (BP Location: Left Arm)   Pulse 72   Temp 98.3 F (36.8 C) (Oral)   Resp 18   LMP 11/12/2018 (Exact Date)   SpO2 99%   Visual Acuity Right Eye Distance:   Left Eye Distance:   Bilateral Distance:    Right Eye Near:   Left Eye Near:    Bilateral Near:     Physical Exam Constitutional:      General: She is not in acute distress.    Appearance: Normal appearance. She is not toxic-appearing or diaphoretic.  HENT:     Head: Normocephalic and atraumatic.      Comments: Tenderness to palpation to circled areas on diagram.    Nose: Nose normal.  Eyes:     General: Lids are normal. Lids are everted, no foreign bodies appreciated. Vision grossly intact. Gaze aligned appropriately.     Extraocular Movements: Extraocular movements intact.     Conjunctiva/sclera: Conjunctivae normal.     Pupils: Pupils are equal, round, and reactive to light.     Right eye: No corneal abrasion or fluorescein uptake.   Pulmonary:     Effort: Pulmonary effort is normal.  Neurological:     General: No focal deficit present.     Mental Status: She is alert and oriented to person, place, and time. Mental status is at baseline.  Psychiatric:        Mood and Affect: Mood normal.        Behavior: Behavior normal.        Thought Content: Thought content normal.        Judgment: Judgment  normal.      UC Treatments / Results  Labs (all labs ordered are listed, but only abnormal results are displayed) Labs Reviewed - No data to display  EKG   Radiology No results found.  Procedures Procedures (including critical care time)  Medications Ordered in UC Medications - No data to display  Initial Impression / Assessment and Plan / UC Course  I have reviewed the triage vital signs and the nursing notes.  Pertinent labs & imaging results that were available during my care of the patient were reviewed by me and considered in my medical decision making (see chart for details).     Fluorescein stain completed with no obvious reuptake.  Patient has tenderness to palpation to right cheek, lateral to right eye, right eyebrow.  Low concern for orbital cellulitis given no pain with extraocular movements and no swelling noted.  Low concern for sinus infection given patient's physical exam and no new associated nasal congestion or runny nose.  Does not appear to be subconjunctival hemorrhage. Consulted Dr. Tobe Sos with on-call ophthalmology.  Advised following up tomorrow at 8 AM at [facility] for further evaluation and management.  He advised that erythromycin may be beneficial until otherwise advised.  Patient verbalized understanding and was agreeable with plan.  Patient provided with contact information and address for [facility] for follow-up tomorrow. Final Clinical Impressions(s) / UC Diagnoses   Final diagnoses:  Irritation of right eye  Right facial pain     Discharge Instructions      You have been prescribed an eye ointment.  Please go to [facility] at provided address tomorrow at 8 AM  for further evaluation and management by ophthalmology.    ED Prescriptions     Medication Sig Dispense Auth. Provider   erythromycin ophthalmic ointment Place a 1/2 inch ribbon of ointment into the lower eyelid 4 times daily. 3.5 g Teodora Medici, [place]      PDMP not reviewed this encounter.   Teodora Medici, [place] 04/10/22 [place], [place], [place] 04/10/22 [phone]

## 2022-04-19 ENCOUNTER — Telehealth: Payer: Self-pay

## 2022-04-19 NOTE — Telephone Encounter (Signed)
Pt is currently at urgent right now being treated

## 2022-04-19 NOTE — Telephone Encounter (Signed)
Pt. Called stating she is positive she has covid, because her Elenor Legato has it and she was around her a few days ago. She is hot/cold, really bad ST, fever of 102. I offered her a virtual tomorrow since you are completely full today and we have no apts. Available today. She was thinking of going to an Urgent today since we have no apts. But said she does think she could sit there that long as bad as she feels. She wanted to know if there was any way you could call in something for her or squeeze her in today virtually. I told her we usually do not call in anything for a pt. Without having an apt. Please send back to someone else because I leave at 12:30 today. Thank you.

## 2022-05-04 ENCOUNTER — Encounter: Payer: Self-pay | Admitting: Internal Medicine

## 2022-05-19 ENCOUNTER — Other Ambulatory Visit: Payer: Self-pay | Admitting: Medical

## 2022-05-19 ENCOUNTER — Ambulatory Visit (INDEPENDENT_AMBULATORY_CARE_PROVIDER_SITE_OTHER): Payer: BC Managed Care – PPO | Admitting: Medical

## 2022-05-19 VITALS — BP 120/80 | HR 67 | Temp 98.7°F | Ht 67.0 in | Wt 201.8 lb

## 2022-05-19 DIAGNOSIS — F419 Anxiety disorder, unspecified: Secondary | ICD-10-CM

## 2022-05-19 DIAGNOSIS — Z113 Encounter for screening for infections with a predominantly sexual mode of transmission: Secondary | ICD-10-CM

## 2022-05-19 DIAGNOSIS — I10 Essential (primary) hypertension: Secondary | ICD-10-CM

## 2022-05-19 DIAGNOSIS — R7301 Impaired fasting glucose: Secondary | ICD-10-CM

## 2022-05-19 DIAGNOSIS — Z23 Encounter for immunization: Secondary | ICD-10-CM | POA: Diagnosis not present

## 2022-05-19 DIAGNOSIS — M2550 Pain in unspecified joint: Secondary | ICD-10-CM

## 2022-05-19 DIAGNOSIS — Z7185 Encounter for immunization safety counseling: Secondary | ICD-10-CM

## 2022-05-19 DIAGNOSIS — Z0000 Encounter for general adult medical examination without abnormal findings: Secondary | ICD-10-CM | POA: Diagnosis not present

## 2022-05-19 DIAGNOSIS — Z86018 Personal history of other benign neoplasm: Secondary | ICD-10-CM

## 2022-05-19 DIAGNOSIS — D509 Iron deficiency anemia, unspecified: Secondary | ICD-10-CM

## 2022-05-19 DIAGNOSIS — Z90710 Acquired absence of both cervix and uterus: Secondary | ICD-10-CM

## 2022-05-19 DIAGNOSIS — J301 Allergic rhinitis due to pollen: Secondary | ICD-10-CM

## 2022-05-19 DIAGNOSIS — Z1231 Encounter for screening mammogram for malignant neoplasm of breast: Secondary | ICD-10-CM

## 2022-05-19 DIAGNOSIS — Z13220 Encounter for screening for lipoid disorders: Secondary | ICD-10-CM

## 2022-05-19 DIAGNOSIS — E785 Hyperlipidemia, unspecified: Secondary | ICD-10-CM

## 2022-05-19 DIAGNOSIS — J4520 Mild intermittent asthma, uncomplicated: Secondary | ICD-10-CM

## 2022-05-19 DIAGNOSIS — E559 Vitamin D deficiency, unspecified: Secondary | ICD-10-CM

## 2022-05-19 LAB — POCT URINALYSIS DIP (PROADVANTAGE DEVICE)
Bilirubin, UA: NEGATIVE
Glucose, UA: NEGATIVE mg/dL
Leukocytes, UA: NEGATIVE
Nitrite, UA: NEGATIVE
Specific Gravity, Urine: 1.02
Urobilinogen, Ur: NEGATIVE
pH, UA: 6.5 (ref 5.0–8.0)

## 2022-05-19 MED ORDER — DOXYCYCLINE HYCLATE 100 MG PO TABS: 100.0000 mg | ORAL_TABLET | Freq: Two times a day (BID) | ORAL | 1 refills | Status: DC

## 2022-05-19 MED ORDER — CLINDAMYCIN PHOSPHATE 1 % EX GEL: CUTANEOUS | 0 refills | Status: DC

## 2022-05-19 MED ORDER — CLINDAMYCIN PHOS-BENZOYL PEROX 1-5 % EX GEL: Freq: Two times a day (BID) | CUTANEOUS | 2 refills | Status: DC

## 2022-05-19 NOTE — Progress Notes (Addendum)
Subjective:   HPI  Alisha Coffey is a 46 y.o. female who presents for Chief Complaint  Patient presents with   Fasting  cpe    Fasting cpe, boils still occurring- meds didn't help. Obgyn- needs a new one    Patient Care Team: Amea Mcphail, Camelia Eng, PA-C as PCP - General (Family Medicine) Prior gyn with Dr. Sanjuana Kava and Earnstine Regal PA  Dr. Rex Kras, cardiology Dr. Chucky May, psychiatrist [facility] [place] [facility]   Concerns: Still dealing with boils particular one in her pubic area we talked about last visit.  It comes and goes but will not go away completely.  She used antibiotic last visit.  She has used the topical mupirocin.  Last visit we talked about [facility] for weight loss but this is not available in the pharmacy.  She thinks she needs to be back on iron, today.  She has a history of anemia.  She had surgery for uterine fibroids, had a hysterectomy January 2021  In recent months she has been dealing with some depression and job stress issues.  She has been out of work since July 31 til back on October 4.  Seeing therapist and [facility] psychiatry.  She has anxieties about school shootings, anxiety about COVID.  She just had COVID 3 weeks ago.  She has had some time and was COVID.   Past Medical History:  Diagnosis Date   Acne    Anxiety    Chronic headache 2018   improved after treatment for anxiety 2018   History of sinusitis    Insomnia    Joint pain    Obesity    Uterine fibroid     Family History  Problem Relation Age of Onset   Hypertension Mother    Kidney Stones Mother    Stroke Maternal Aunt    Depression Maternal Grandmother    Hypertension Maternal Grandmother    Diabetes Maternal Grandfather    Hypertension Maternal Grandfather    Cancer Neg Hx    Heart disease Neg Hx      Current Outpatient Medications:    albuterol (VENTOLIN HFA) 108 (90 Base) MCG/ACT inhaler, Inhale 2 puffs into the lungs every 6 (six) hours as needed  for wheezing or shortness of breath., Disp: 3 each, Rfl: 0   ALPRAZolam (XANAX) 1 MG tablet, Take 1 mg by mouth daily as needed for anxiety., Disp: , Rfl:    cetirizine (ZYRTEC) 10 MG tablet, Take 1 tablet (10 mg total) by mouth at bedtime as needed for allergies., Disp: 90 tablet, Rfl: 1   Cholecalciferol (VITAMIN D) 50 MCG (2000 UT) CAPS, Take 1 capsule (2,000 Units total) by mouth daily., Disp: 90 capsule, Rfl: 1   clindamycin-benzoyl peroxide (BENZACLIN) gel, Apply topically 2 (two) times daily., Disp: 25 g, Rfl: 2   Continuous Blood Gluc Sensor (FREESTYLE LIBRE 14 DAY SENSOR) MISC, 1 each by Does not apply route every 14 (fourteen) days., Disp: 6 each, Rfl: 3   cyanocobalamin (VITAMIN B12) 1000 MCG tablet, Take 1,000 mcg by mouth daily., Disp: , Rfl:    doxycycline (VIBRA-TABS) 100 MG tablet, Take 1 tablet (100 mg total) by mouth 2 (two) times daily. X 7 days for skin flare up, Disp: 30 tablet, Rfl: 1   fluticasone (FLONASE) 50 MCG/ACT nasal spray, Place 2 sprays into both nostrils daily., Disp: 16 g, Rfl: 6   folic acid (FOLVITE) 716 MCG tablet, Take 800 mcg by mouth daily., Disp: , Rfl:  losartan (COZAAR) 50 MG tablet, TAKE 1 TABLET BY MOUTH EVERY DAY IN THE EVENING, Disp: 90 tablet, Rfl: 0   olopatadine (PATANOL) 0.1 % ophthalmic solution, Place 1 drop into both eyes 2 (two) times daily., Disp: 5 mL, Rfl: 3   Probiotic Product (UP4 PROBIOTICS WOMENS PO), Take by mouth., Disp: , Rfl:    rosuvastatin (CRESTOR) 10 MG tablet, Take 1 tablet (10 mg total) by mouth daily., Disp: 90 tablet, Rfl: 0   sulfamethoxazole-trimethoprim (BACTRIM DS) 800-160 MG tablet, Take 1 tablet by mouth 2 (two) times daily., Disp: 20 tablet, Rfl: 0   triamterene-hydrochlorothiazide (MAXZIDE-25) 37.5-25 MG tablet, Take 1 tablet by mouth daily., Disp: 90 tablet, Rfl: 0   mometasone-formoterol (DULERA) 200-5 MCG/ACT AERO, Inhale 2 puffs into the lungs 2 (two) times daily. (Patient not taking: Reported on 05/19/2022), Disp:  3 each, Rfl: 2  Allergies  Allergen Reactions   Belsomra [Suvorexant]     Didn't tolerate   Propoxyphene Itching   Valium [Diazepam] Nausea And Vomiting    Reviewed their medical, surgical, family, social, medication, and allergy history and updated chart as appropriate.   Review of Systems Constitutional: -fever, -chills, -sweats, -unexpected weight change, -decreased appetite, +fatigue Allergy: -sneezing, -itching, -congestion Dermatology: -changing moles, +rash, -lumps ENT: -runny nose, -ear pain, -sore throat, -hoarseness, -sinus pain, -teeth pain, - ringing in ears, -hearing loss, -nosebleeds Cardiology: -chest pain, -palpitations, -swelling, -difficulty breathing when lying flat, -waking up short of breath Respiratory: -cough, -shortness of breath, -difficulty breathing with exercise or exertion, -wheezing, -coughing up blood Gastroenterology: -abdominal pain, -nausea, -vomiting, -diarrhea, -constipation, -blood in stool, -changes in bowel movement, -difficulty swallowing or eating Hematology: -bleeding, -bruising  Musculoskeletal: -joint aches, -muscle aches, -joint swelling, -back pain, -neck pain, -cramping, -changes in gait Ophthalmology: denies vision changes, eye redness, itching, discharge Urology: -burning with urination, -difficulty urinating, -blood in urine, -urinary frequency, -urgency, -incontinence Neurology: -headache, -weakness, -tingling, -numbness, -memory loss, -falls, -dizziness Psychology: -depressed mood, -agitation, -sleep problems Breast/gyn: -breast tendnerss, -discharge, -lumps, -vaginal discharge,- irregular periods, -heavy periods      Objective:  BP 120/80   Pulse 67   Temp 98.7 F (37.1 C)   Ht '5\' 7"'$  (1.702 m)   Wt 201 lb 12.8 oz (91.5 kg)   LMP 11/12/2018 (Exact Date)   BMI 31.61 kg/m    Wt Readings from Last 3 Encounters:  05/19/22 201 lb 12.8 oz (91.5 kg)  03/24/22 204 lb 3.2 oz (92.6 kg)  10/18/21 203 lb 3.2 oz (92.2 kg)   BP  Readings from Last 3 Encounters:  05/19/22 120/80  04/10/22 (!) 152/89  03/24/22 120/80    General appearance: alert, no distress, WD/WN, African American female Skin: Right mons pubis area with a cystic collection of tender tissue that appears a little pinkish but no current drainage, no induration or fluctuance.  Otherwise no other skin lesions of concern. HEENT: normocephalic, conjunctiva/corneas normal, sclerae anicteric, PERRLA, EOMi, nares patent, no discharge or erythema, pharynx normal Neck: supple, no lymphadenopathy, no thyromegaly, no masses, normal ROM, no bruits Chest: non tender, normal shape and expansion Heart: RRR, normal S1, S2, no murmurs Lungs: CTA bilaterally, no wheezes, rhonchi, or rales Abdomen: +bs, soft, non tender, non distended, no masses, no hepatomegaly, no splenomegaly, no bruits Back: non tender, normal ROM, no scoliosis Musculoskeletal: upper extremities non tender, no obvious deformity, normal ROM throughout, lower extremities non tender, no obvious deformity, normal ROM throughout Extremities: no edema, no cyanosis, no clubbing Pulses: 2+ symmetric, upper and lower extremities,  normal cap refill Neurological: alert, oriented x 3, CN2-12 intact, strength normal upper extremities and lower extremities, sensation normal throughout, DTRs 2+ throughout, no cerebellar signs, gait normal Psychiatric: normal affect, behavior normal, pleasant   Breast: nontender, status post prior breast reduction surgery scars, no masses or lumps, no skin changes, no nipple discharge or inversion, no axillary lymphadenopathy Gyn: Normal external genitalia without lesions, vagina with normal mucosa, no abnormal vaginal discharge. adnexa not enlarged, nontender, no masses.  Exam chaperoned by nurse. Rectal: Anus normal appearing     Assessment and Plan :   Encounter Diagnoses  Name Primary?   Routine general medical examination at a health care facility Yes   Needs flu shot     Screen for STD (sexually transmitted disease)    Benign hypertension    Impaired fasting blood sugar    Iron deficiency anemia, unspecified iron deficiency anemia type    Mild intermittent asthma without complication    Primary hypertension    Polyarthralgia    Hyperlipidemia, unspecified hyperlipidemia type    History of uterine fibroid    History of hysterectomy    Seasonal allergic rhinitis due to pollen    Anxiety    Vitamin D deficiency    Vaccine counseling    Screening for lipid disorders    Encounter for screening mammogram for malignant neoplasm of breast     Today you had a preventative care visit or wellness visit.    Topics today may have included healthy lifestyle, diet, exercise, preventative care, vaccinations, sick and well care, proper use of emergency dept and after hours care, as well as other concerns.     Recommendations: Continue to return yearly for your annual wellness and preventative care visits.  This gives [place] a chance to discuss healthy lifestyle, exercise, vaccinations, review your chart record, and perform screenings where appropriate.  I recommend you see your eye doctor yearly for routine vision care.  I recommend you see your dentist yearly for routine dental care including hygiene visits twice yearly.   Vaccination recommendations were reviewed  Up to date except for covid booster  She plans to get this soon   Screening for cancer: Breast cancer screening: You should perform a self breast exam monthly.   We reviewed recommendations for regular mammograms and breast cancer screening.  Colon cancer screening:  Age 100  Cervical cancer screening: We reviewed recommendations for pap smear screening.  Skin cancer screening: Check your skin regularly for new changes, growing lesions, or other lesions of concern Come in for evaluation if you have skin lesions of concern.  Lung cancer screening: If you have a greater than 30 pack year  history of tobacco use, then you qualify for lung cancer screening with a chest CT scan  We currently don't have screenings for other cancers besides breast, cervical, colon, and lung cancers.  If you have a strong family history of cancer or have other cancer screening concerns, please let me know.    Bone health: Get at least 150 minutes of aerobic exercise weekly Get weight bearing exercise at least once weekly   Heart health: Get at least 150 minutes of aerobic exercise weekly Limit alcohol It is important to maintain a healthy blood pressure and healthy cholesterol numbers    Separate significant issues discussed:  Hypertension-continue current medication  History of anemia-updated labs today, not currently on iron  Status post hysterectomy due to fibroids  Anxiety-continue with counseling, continue current medicatin per psychiatry  Hyperlipidemia-continue  statin, labs today  Asthma-no recent issues, continue albuterol as needed.  Not currently on Dulera preventative  Hidradenitis, cystic lesions of the skin in the mons pubis-begin trial of BenzaClin gel, for flareups can use doxycycline.  If this does not seem to be helping things in the near future consider daily doxycycline for a month or 2.  Consider waxing instead of shaving.  Practice good hygiene.    Alvin was seen today for fasting  cpe.  Diagnoses and all orders for this visit:  Routine general medical examination at a health care facility -     POCT Urinalysis DIP (Proadvantage Device) -     Comprehensive metabolic panel -     CBC with Differential/Platelet -     Lipid panel -     VITAMIN D 25 Hydroxy (Vit-D Deficiency, Fractures) -     Hemoglobin A1c -     RPR+HIV+GC+CT Panel -     Iron  Needs flu shot -     Cancel: Flu Vaccine QUAD 30moIM (Fluarix, Fluzone & Alfiuria Quad PF)  Screen for STD (sexually transmitted disease) -     RPR+HIV+GC+CT Panel  Benign hypertension  Impaired fasting blood  sugar -     Hemoglobin A1c  Iron deficiency anemia, unspecified iron deficiency anemia type -     Iron  Mild intermittent asthma without complication  Primary hypertension  Polyarthralgia  Hyperlipidemia, unspecified hyperlipidemia type -     Lipid panel  History of uterine fibroid  History of hysterectomy  Seasonal allergic rhinitis due to pollen  Anxiety  Vitamin D deficiency -     VITAMIN D 25 Hydroxy (Vit-D Deficiency, Fractures)  Vaccine counseling  Screening for lipid disorders  Encounter for screening mammogram for malignant neoplasm of breast -     MM DIGITAL SCREENING BILATERAL; Future  Other orders -     clindamycin-benzoyl peroxide (BENZACLIN) gel; Apply topically 2 (two) times daily. -     doxycycline (VIBRA-TABS) 100 MG tablet; Take 1 tablet (100 mg total) by mouth 2 (two) times daily. X 7 days for skin flare up   Follow-up pending labs, yearly for physical

## 2022-05-20 ENCOUNTER — Other Ambulatory Visit (INDEPENDENT_AMBULATORY_CARE_PROVIDER_SITE_OTHER): Payer: BC Managed Care – PPO

## 2022-05-20 ENCOUNTER — Other Ambulatory Visit: Payer: Self-pay | Admitting: Medical

## 2022-05-20 DIAGNOSIS — Z23 Encounter for immunization: Secondary | ICD-10-CM

## 2022-05-20 DIAGNOSIS — H579 Unspecified disorder of eye and adnexa: Secondary | ICD-10-CM

## 2022-05-20 DIAGNOSIS — J301 Allergic rhinitis due to pollen: Secondary | ICD-10-CM

## 2022-05-20 DIAGNOSIS — I10 Essential (primary) hypertension: Secondary | ICD-10-CM

## 2022-05-20 DIAGNOSIS — E559 Vitamin D deficiency, unspecified: Secondary | ICD-10-CM

## 2022-05-20 DIAGNOSIS — J4520 Mild intermittent asthma, uncomplicated: Secondary | ICD-10-CM

## 2022-05-20 MED ORDER — SAXENDA 18 MG/3ML ~~LOC~~ SOPN: 3.0000 mg | PEN_INJECTOR | Freq: Every day | SUBCUTANEOUS | 2 refills | Status: DC

## 2022-05-20 MED ORDER — TRIAMTERENE-HCTZ 37.5-25 MG PO TABS: 1.0000 | ORAL_TABLET | Freq: Every day | ORAL | 3 refills | Status: DC

## 2022-05-20 MED ORDER — OLOPATADINE HCL 0.1 % OP SOLN: 1.0000 [drp] | Freq: Two times a day (BID) | OPHTHALMIC | 3 refills | Status: DC

## 2022-05-20 MED ORDER — LOSARTAN POTASSIUM 50 MG PO TABS: ORAL_TABLET | ORAL | 3 refills | Status: DC

## 2022-05-20 MED ORDER — CETIRIZINE HCL 10 MG PO TABS: 10.0000 mg | ORAL_TABLET | Freq: Every evening | ORAL | 3 refills | Status: DC | PRN

## 2022-05-20 MED ORDER — FLUTICASONE PROPIONATE 50 MCG/ACT NA SUSP: 2.0000 | Freq: Every day | NASAL | 6 refills | Status: DC

## 2022-05-20 MED ORDER — ROSUVASTATIN CALCIUM 20 MG PO TABS: 20.0000 mg | ORAL_TABLET | Freq: Every day | ORAL | 3 refills | Status: DC

## 2022-05-20 MED ORDER — VITAMIN D 50 MCG (2000 UT) PO CAPS: 1.0000 | ORAL_CAPSULE | Freq: Every day | ORAL | 3 refills | Status: DC

## 2022-05-20 MED ORDER — ALBUTEROL SULFATE HFA 108 (90 BASE) MCG/ACT IN AERS: 2.0000 | INHALATION_SPRAY | Freq: Four times a day (QID) | RESPIRATORY_TRACT | 1 refills | Status: DC | PRN

## 2022-05-21 ENCOUNTER — Telehealth: Payer: Self-pay | Admitting: Medical

## 2022-05-21 NOTE — Telephone Encounter (Signed)
P.A. SAXENDA  

## 2022-05-22 LAB — COMPREHENSIVE METABOLIC PANEL
ALT: 24 IU/L (ref 0–32)
AST: 20 IU/L (ref 0–40)
Albumin/Globulin Ratio: 1.7 (ref 1.2–2.2)
Albumin: 4.5 g/dL (ref 3.9–4.9)
Alkaline Phosphatase: 63 IU/L (ref 44–121)
BUN/Creatinine Ratio: 15 (ref 9–23)
BUN: 13 mg/dL (ref 6–24)
Bilirubin Total: 0.4 mg/dL (ref 0.0–1.2)
CO2: 22 mmol/L (ref 20–29)
Calcium: 9.5 mg/dL (ref 8.7–10.2)
Chloride: 101 mmol/L (ref 96–106)
Creatinine, Ser: 0.89 mg/dL (ref 0.57–1.00)
Globulin, Total: 2.7 g/dL (ref 1.5–4.5)
Glucose: 87 mg/dL (ref 70–99)
Potassium: 4.1 mmol/L (ref 3.5–5.2)
Sodium: 136 mmol/L (ref 134–144)
Total Protein: 7.2 g/dL (ref 6.0–8.5)
eGFR: 82 mL/min/{1.73_m2} (ref >59)

## 2022-05-22 LAB — RPR+HIV+GC+CT PANEL
Chlamydia trachomatis, NAA: NEGATIVE
HIV Screen 4th Generation wRfx: NONREACTIVE
Neisseria Gonorrhoeae by PCR: NEGATIVE
RPR Ser Ql: NONREACTIVE

## 2022-05-22 LAB — CBC WITH DIFFERENTIAL/PLATELET
Basophils Absolute: 0.1 10*3/uL (ref 0.0–0.2)
Basos: 1 %
EOS (ABSOLUTE): 0.2 10*3/uL (ref 0.0–0.4)
Eos: 4 %
Hematocrit: 40.5 % (ref 34.0–46.6)
Hemoglobin: 13.8 g/dL (ref 11.1–15.9)
Immature Grans (Abs): 0 10*3/uL (ref 0.0–0.1)
Immature Granulocytes: 0 %
Lymphocytes Absolute: 1.5 10*3/uL (ref 0.7–3.1)
Lymphs: 34 %
MCH: 31.5 pg (ref 26.6–33.0)
MCHC: 34.1 g/dL (ref 31.5–35.7)
MCV: 93 fL (ref 79–97)
Monocytes Absolute: 0.3 10*3/uL (ref 0.1–0.9)
Monocytes: 7 %
Neutrophils Absolute: 2.5 10*3/uL (ref 1.4–7.0)
Neutrophils: 54 %
Platelets: 219 10*3/uL (ref 150–450)
RBC: 4.38 x10E6/uL (ref 3.77–5.28)
RDW: 12.8 % (ref 11.7–15.4)
WBC: 4.5 10*3/uL (ref 3.4–10.8)

## 2022-05-22 LAB — VITAMIN D 25 HYDROXY (VIT D DEFICIENCY, FRACTURES): Vit D, 25-Hydroxy: 21.3 ng/mL — ABNORMAL LOW (ref 30.0–100.0)

## 2022-05-22 LAB — LIPID PANEL
Chol/HDL Ratio: 3.8 ratio (ref 0.0–4.4)
Cholesterol, Total: 221 mg/dL — ABNORMAL HIGH (ref 100–199)
HDL: 58 mg/dL (ref >39)
LDL Chol Calc (NIH): 147 mg/dL — ABNORMAL HIGH (ref 0–99)
Triglycerides: 92 mg/dL (ref 0–149)
VLDL Cholesterol Cal: 16 mg/dL (ref 5–40)

## 2022-05-22 LAB — IRON: Iron: 59 ug/dL (ref 27–159)

## 2022-05-22 LAB — HEMOGLOBIN A1C
Est. average glucose Bld gHb Est-mCnc: 114 mg/dL
Hgb A1c MFr Bld: 5.6 % (ref 4.8–5.6)

## 2022-05-28 NOTE — Telephone Encounter (Signed)
P.A. approved til 09/18/22, sent mychart message

## 2022-06-07 ENCOUNTER — Encounter: Payer: Self-pay | Admitting: Internal Medicine

## 2022-06-20 ENCOUNTER — Ambulatory Visit
Admission: RE | Admit: 2022-06-20 | Discharge: 2022-06-20 | Disposition: A | Discharge status: Home / Self Care | Payer: BC Managed Care – PPO | Source: Ambulatory Visit | Attending: Medical | Admitting: Medical

## 2022-06-20 DIAGNOSIS — Z1231 Encounter for screening mammogram for malignant neoplasm of breast: Secondary | ICD-10-CM

## 2022-06-23 ENCOUNTER — Other Ambulatory Visit: Payer: Self-pay | Admitting: Medical

## 2022-06-23 DIAGNOSIS — N6320 Unspecified lump in the left breast, unspecified quadrant: Secondary | ICD-10-CM

## 2022-06-28 ENCOUNTER — Other Ambulatory Visit: Payer: Self-pay | Admitting: Medical

## 2022-06-28 DIAGNOSIS — R928 Other abnormal and inconclusive findings on diagnostic imaging of breast: Secondary | ICD-10-CM

## 2022-07-01 ENCOUNTER — Ambulatory Visit
Admission: RE | Admit: 2022-07-01 | Discharge: 2022-07-01 | Disposition: A | Discharge status: Home / Self Care | Payer: BC Managed Care – PPO | Source: Ambulatory Visit | Attending: Medical | Admitting: Medical

## 2022-07-01 DIAGNOSIS — R928 Other abnormal and inconclusive findings on diagnostic imaging of breast: Secondary | ICD-10-CM

## 2022-07-28 ENCOUNTER — Ambulatory Visit: Payer: BC Managed Care – PPO | Admitting: Cardiology

## 2022-08-05 ENCOUNTER — Ambulatory Visit: Payer: BC Managed Care – PPO | Admitting: Cardiology

## 2022-08-05 ENCOUNTER — Encounter: Payer: Self-pay | Admitting: Cardiology

## 2022-08-05 VITALS — BP 116/78 | HR 84 | Ht 67.0 in | Wt 206.6 lb

## 2022-08-05 DIAGNOSIS — R7303 Prediabetes: Secondary | ICD-10-CM

## 2022-08-05 DIAGNOSIS — E7800 Pure hypercholesterolemia, unspecified: Secondary | ICD-10-CM

## 2022-08-05 DIAGNOSIS — I10 Essential (primary) hypertension: Secondary | ICD-10-CM

## 2022-08-05 DIAGNOSIS — E6609 Other obesity due to excess calories: Secondary | ICD-10-CM

## 2022-08-05 MED ORDER — LOSARTAN POTASSIUM 50 MG PO TABS: ORAL_TABLET | ORAL | 1 refills | Status: DC

## 2022-08-05 MED ORDER — TRIAMTERENE-HCTZ 37.5-25 MG PO TABS: 1.0000 | ORAL_TABLET | Freq: Every day | ORAL | 1 refills | Status: DC

## 2022-08-05 NOTE — Progress Notes (Signed)
Date:  08/05/2022   ID:  [name], DOB [birth year], MRN [id number]  PCP:  Carlena Hurl, PA-C  Cardiologist: Rex Kras, DO, [facility] (established care 12/18/2020) Former Cardiology Providers: Jeri Lager, APRN, FNP-C  Date: 08/05/22 Last Office Visit: 11/30/022  Chief Complaint  Patient presents with   Follow-up   Hypertension    1 year    HPI  Alisha Coffey is a 46 y.o. African-American female whose past medical history and cardiovascular risk factors include: Anxiety, depression, chronic migraines, marijuana use, diabetes, hypertension, obesity due to excess calorie.   She was referred to the practice back in April 2022 for benign essential hypertension management.  Prior to establishing care her SBP would range between 160-170 mmHg.  Without titration of medical therapy her blood pressures have improved significantly and she is here for 1 year follow-up visit.  Since last office visit, denies anginal discomfort or heart failure symptoms.  No hospitalizations or urgent care visits for cardiovascular reasons.  She has some residual shortness of breath since her COVID-19 infection in August/September 2023 which is overall improving.  FUNCTIONAL STATUS: No structured exercise program or daily routine.   ALLERGIES: Allergies  Allergen Reactions   Diazepam Nausea And Vomiting and Other (See Comments)   Propoxyphene Itching   Suvorexant Other (See Comments)    Didn't tolerate    MEDICATION LIST PRIOR TO VISIT: Current Meds  Medication Sig   albuterol (VENTOLIN HFA) 108 (90 Base) MCG/ACT inhaler Inhale 2 puffs into the lungs every 6 (six) hours as needed for wheezing or shortness of breath.   ALPRAZolam (XANAX) 1 MG tablet Take 1 mg by mouth daily as needed for anxiety.   cetirizine (ZYRTEC) 10 MG tablet Take 1 tablet (10 mg total) by mouth at bedtime as needed for allergies.   Cholecalciferol (VITAMIN D) 50 MCG (2000 UT) CAPS Take 1 capsule (2,000 Units total) by  mouth daily.   clindamycin (CLINDAGEL) 1 % gel Apply to affected area 2 times daily   Continuous Blood Gluc Sensor (FREESTYLE LIBRE 14 DAY SENSOR) MISC 1 each by Does not apply route every 14 (fourteen) days.   doxycycline (VIBRA-TABS) 100 MG tablet Take 1 tablet (100 mg total) by mouth 2 (two) times daily. X 7 days for skin flare up   fluticasone (FLONASE) 50 MCG/ACT nasal spray Place 2 sprays into both nostrils daily.   Probiotic Product (UP4 PROBIOTICS WOMENS PO) Take by mouth.   rosuvastatin (CRESTOR) 20 MG tablet Take 1 tablet (20 mg total) by mouth daily.   [DISCONTINUED] losartan (COZAAR) 50 MG tablet TAKE 1 TABLET BY MOUTH EVERY DAY IN THE EVENING   [DISCONTINUED] triamterene-hydrochlorothiazide (MAXZIDE-25) 37.5-25 MG tablet Take 1 tablet by mouth daily.     PAST MEDICAL HISTORY: Past Medical History:  Diagnosis Date   Acne    Anxiety    Chronic headache 2018   improved after treatment for anxiety 2018   History of sinusitis    Hypertension    Insomnia    Joint pain    Obesity    Uterine fibroid     PAST SURGICAL HISTORY: Past Surgical History:  Procedure Laterality Date   ABDOMINAL HYSTERECTOMY N/A 09/07/2020   Procedure: HYSTERECTOMY ABDOMINAL;  Surgeon: Sanjuana Kava, MD;  Location: [place];  Service: Gynecology;  Laterality: N/A;   BILATERAL SALPINGECTOMY Bilateral 09/07/2020   Procedure: OPEN BILATERAL SALPINGECTOMY;  Surgeon: Sanjuana Kava, MD;  Location: [place];  Service: Gynecology;  Laterality: Bilateral;  BREAST REDUCTION SURGERY  2009, 2000   HERNIA REPAIR     REDUCTION MAMMAPLASTY Bilateral 2000   REDUCTION MAMMAPLASTY Bilateral 12/2007   WISDOM TOOTH EXTRACTION      FAMILY HISTORY: The patient family history includes Depression in her maternal grandmother; Diabetes in her maternal grandfather; Hypertension in her maternal grandfather, maternal grandmother, and mother; Kidney Stones in her mother; Stroke in her maternal  aunt.  SOCIAL HISTORY:  The patient  reports that she has never smoked. She has never used smokeless tobacco. She reports current alcohol use of about 1.0 standard drink of alcohol per week. She reports that she does not currently use drugs after having used the following drugs: Marijuana.  REVIEW OF SYSTEMS: Review of Systems  Constitutional: Negative for chills and fever.  HENT:  Negative for hoarse voice and nosebleeds.   Eyes:  Negative for discharge, double vision and pain.  Cardiovascular:  Negative for chest pain, claudication, dyspnea on exertion, leg swelling, near-syncope, orthopnea, palpitations, paroxysmal nocturnal dyspnea and syncope.  Respiratory:  Negative for hemoptysis and shortness of breath.   Musculoskeletal:  Negative for muscle cramps and myalgias.  Gastrointestinal:  Negative for abdominal pain, constipation, diarrhea, hematemesis, hematochezia, melena, nausea and vomiting.  Neurological:  Negative for dizziness and light-headedness.    PHYSICAL EXAM:    08/05/2022    9:43 AM 05/19/2022    2:04 PM 04/10/2022    4:07 PM  Vitals with BMI  Height '5\' 7"'$  '5\' 7"'$    Weight 206 lbs 10 oz 201 lbs 13 oz   BMI 18.84 16.6   Systolic 063 016 010  Diastolic 78 80 89  Pulse 84 67 72   Physical Exam  Constitutional: No distress.  Age appropriate, hemodynamically stable.   Neck: No JVD present.  Cardiovascular: Normal rate, regular rhythm, S1 normal, S2 normal, intact distal pulses and normal pulses. Exam reveals no gallop, no S3 and no S4.  No murmur heard. Pulmonary/Chest: Effort normal and breath sounds normal. No stridor. She has no wheezes. She has no rales.  Abdominal: Soft. Bowel sounds are normal. She exhibits no distension. There is no abdominal tenderness.  Musculoskeletal:        General: No edema.     Cervical back: Neck supple.  Neurological: She is alert and oriented to person, place, and time. She has intact cranial nerves (2-12).  Skin: Skin is warm and  moist.   CARDIAC DATABASE: EKG: 08/05/2022: Normal sinus rhythm, 80 bpm, nonspecific T wave abnormality.  Echocardiogram: 12/24/2020: Normal LV systolic function with visual EF 60-65%. Left ventricle cavity is normal in size. Normal global wall motion. Normal diastolic filling pattern, normal LAP. No significant valvular abnormalities. No prior study for comparison.   Stress Testing: Exercise treadmill stress test 02/19/2021: Exercise treadmill stress test performed using Bruce protocol.  Patient reached 10.1 METS, and 84% of age predicted maximum heart rate.  Exercise capacity was good.  No chest pain reported.  Normal heart rate and hemodynamic response. Stress EKG revealed no ischemic changes. Low risk study.  Heart Catheterization: None  LABORATORY DATA:    Latest Ref Rng & Units 05/19/2022    2:37 PM 02/18/2021    8:51 AM 12/24/2020    9:13 AM  CBC  WBC 3.4 - 10.8 x10E3/uL 4.5  6.0  11.1   Hemoglobin 11.1 - 15.9 g/dL 13.8  13.3  13.6   Hematocrit 34.0 - 46.6 % 40.5  39.6  39.0   Platelets 150 - 450 x10E3/uL 219  244  252        Latest Ref Rng & Units 05/19/2022    2:37 PM 12/24/2020    9:13 AM 12/18/2020    2:26 PM  CMP  Glucose 70 - 99 mg/dL 87  117  127   BUN 6 - 24 mg/dL 13   15   Creatinine 0.57 - 1.00 mg/dL 0.89   0.85   Sodium 134 - 144 mmol/L 136   140   Potassium 3.5 - 5.2 mmol/L 4.1   3.5   Chloride 96 - 106 mmol/L 101   101   CO2 20 - 29 mmol/L 22   20   Calcium 8.7 - 10.2 mg/dL 9.5   10.1   Total Protein 6.0 - 8.5 g/dL 7.2  7.1    Total Bilirubin 0.0 - 1.2 mg/dL 0.4  0.3    Alkaline Phos 44 - 121 IU/L 63  72    AST 0 - 40 IU/L 20  16    ALT 0 - 32 IU/L 24  30      Lipid Panel  Lab Results  Component Value Date   CHOL 221 (H) 05/19/2022   HDL 58 05/19/2022   LDLCALC 147 (H) 05/19/2022   TRIG 92 05/19/2022   CHOLHDL 3.8 05/19/2022   No components found for: "NTPROBNP" No results for input(s): "PROBNP" in the last 8760 hours. No results for  input(s): "TSH" in the last 8760 hours.  BMP Recent Labs    05/19/22 1437  NA 136  K 4.1  CL 101  CO2 22  GLUCOSE 87  BUN 13  CREATININE 0.89  CALCIUM 9.5    HEMOGLOBIN A1C Lab Results  Component Value Date   HGBA1C 5.6 05/19/2022    IMPRESSION:    ICD-10-CM   1. Benign hypertension  I10 EKG 12-Lead    losartan (COZAAR) 50 MG tablet    triamterene-hydrochlorothiazide (MAXZIDE-25) 37.5-25 MG tablet    2. Prediabetes  R73.03     3. Pure hypercholesterolemia  E78.00     4. Class 1 obesity due to excess calories with serious comorbidity and body mass index (BMI) of 32.0 to 32.9 in adult  E66.09    Z68.32        RECOMMENDATIONS: Joye D Kuznia is a 46 y.o. female whose past medical history and cardiac risk factors include: Anxiety, depression, chronic migraines, marijuana use, prediabetes, hyperlipidemia, hypertension, obesity due to excess calorie.   Benign hypertension Home and office blood pressures are very well-controlled. Medications are reconciled. No changes warranted at this time.  Prediabetes Educated on importance of glycemic control. Currently being managed by primary team.  Pure hypercholesterolemia In the past her LDL was as high as 203 mg/dL.  Her most recent lipids from September 2023 noted an LDL of 147 mg/dL.  When asked patient states that she was not religiously taking her Crestor prior to the labs in September 2023. She is currently working with PCP to improve compliance and will have her lipids rechecked to see if medication titration is warranted. Currently managed by primary care provider.  Class 1 obesity due to excess calories with serious comorbidity and body mass index (BMI) of 32.0 to 32.9 in adult Body mass index is 32.36 kg/m. I reviewed with the patient the importance of diet, regular physical activity/exercise, weight loss.   Patient is educated on increasing physical activity gradually as tolerated.  With the goal of moderate  intensity exercise for 30 minutes a day 5 days a  week.  Over the last 12 months patient is doing well from a cardiovascular standpoint her blood pressure is well-controlled.  She does have some residual shortness of breath likely secondary to her recent COVID-19 infection in August/September 2023.  No symptoms of heart failure or anginal discomfort.  Going forward I would like to see her back on an annual basis or on as needed basis as long as she is doing well.  No changes recommended from a cardiovascular standpoint at today's office visit.  FINAL MEDICATION LIST END OF ENCOUNTER: Meds ordered this encounter  Medications   losartan (COZAAR) 50 MG tablet    Sig: TAKE 1 TABLET BY MOUTH EVERY DAY IN THE EVENING    Dispense:  90 tablet    Refill:  1   triamterene-hydrochlorothiazide (MAXZIDE-25) 37.5-25 MG tablet    Sig: Take 1 tablet by mouth daily.    Dispense:  90 tablet    Refill:  1    Medications Discontinued During This Encounter  Medication Reason   olopatadine (PATANOL) 0.1 % ophthalmic solution Patient Preference   sulfamethoxazole-trimethoprim (BACTRIM DS) 800-160 MG tablet Completed Course   losartan (COZAAR) 50 MG tablet Reorder   triamterene-hydrochlorothiazide (MAXZIDE-25) 37.5-25 MG tablet Reorder     Current Outpatient Medications:    albuterol (VENTOLIN HFA) 108 (90 Base) MCG/ACT inhaler, Inhale 2 puffs into the lungs every 6 (six) hours as needed for wheezing or shortness of breath., Disp: 1 each, Rfl: 1   ALPRAZolam (XANAX) 1 MG tablet, Take 1 mg by mouth daily as needed for anxiety., Disp: , Rfl:    cetirizine (ZYRTEC) 10 MG tablet, Take 1 tablet (10 mg total) by mouth at bedtime as needed for allergies., Disp: 90 tablet, Rfl: 3   Cholecalciferol (VITAMIN D) 50 MCG (2000 UT) CAPS, Take 1 capsule (2,000 Units total) by mouth daily., Disp: 90 capsule, Rfl: 3   clindamycin (CLINDAGEL) 1 % gel, Apply to affected area 2 times daily, Disp: 30 g, Rfl: 0   Continuous Blood  Gluc Sensor (FREESTYLE LIBRE 14 DAY SENSOR) MISC, 1 each by Does not apply route every 14 (fourteen) days., Disp: 6 each, Rfl: 3   doxycycline (VIBRA-TABS) 100 MG tablet, Take 1 tablet (100 mg total) by mouth 2 (two) times daily. X 7 days for skin flare up, Disp: 30 tablet, Rfl: 1   fluticasone (FLONASE) 50 MCG/ACT nasal spray, Place 2 sprays into both nostrils daily., Disp: 16 g, Rfl: 6   Probiotic Product (UP4 PROBIOTICS WOMENS PO), Take by mouth., Disp: , Rfl:    rosuvastatin (CRESTOR) 20 MG tablet, Take 1 tablet (20 mg total) by mouth daily., Disp: 90 tablet, Rfl: 3   cephALEXin (KEFLEX) 500 MG capsule, Take 500 mg by mouth daily., Disp: , Rfl:    cyanocobalamin (VITAMIN B12) 1000 MCG tablet, Take 1,000 mcg by mouth daily. (Patient not taking: Reported on 08/05/2022), Disp: , Rfl:    Liraglutide -Weight Management (SAXENDA) 18 MG/3ML SOPN, Inject 3 mg into the skin daily. Start 0.'6mg'$  daily x 1 week, then increase to 1.'2mg'$  daily x 1wk, then 1.'8mg'$  daily x 1 wk, then '3mg'$  daily (Patient not taking: Reported on 08/05/2022), Disp: 3 mL, Rfl: 2   losartan (COZAAR) 50 MG tablet, TAKE 1 TABLET BY MOUTH EVERY DAY IN THE EVENING, Disp: 90 tablet, Rfl: 1   triamterene-hydrochlorothiazide (MAXZIDE-25) 37.5-25 MG tablet, Take 1 tablet by mouth daily., Disp: 90 tablet, Rfl: 1  Orders Placed This Encounter  Procedures   EKG 12-Lead  There are no Patient Instructions on file for this visit.   --Continue cardiac medications as reconciled in final medication list. --Return in about 1 year (around 08/06/2023), or if symptoms worsen or fail to improve, for Follow up, BP. Or sooner if needed. --Continue follow-up with your primary care physician regarding the management of your other chronic comorbid conditions.  Patient's questions and concerns were addressed to her satisfaction. She voices understanding of the instructions provided during this encounter.   This note was created using a voice recognition  software as a result there may be grammatical errors inadvertently enclosed that do not reflect the nature of this encounter. Every attempt is made to correct such errors.  Rex Kras, [place], [facility]  Pager: [phone] Office: [phone]

## 2022-08-23 ENCOUNTER — Telehealth: Payer: Self-pay | Admitting: Internal Medicine

## 2022-08-23 NOTE — Telephone Encounter (Signed)
P.A Kirke Shaggy, sent through covermymeds. Waiting on response

## 2022-08-24 NOTE — Telephone Encounter (Signed)
This has been denied but sent an appeal to try to get this approved

## 2022-08-29 HISTORY — PX: TRIGGER FINGER RELEASE: SHX641

## 2022-08-30 NOTE — Telephone Encounter (Signed)
Still waiting on appeal to see if approved or not

## 2022-09-15 ENCOUNTER — Ambulatory Visit: Payer: 59 | Admitting: Medical

## 2022-09-15 VITALS — BP 122/86 | HR 68 | Temp 97.7°F | Wt 206.6 lb

## 2022-09-15 DIAGNOSIS — M79642 Pain in left hand: Secondary | ICD-10-CM

## 2022-09-15 DIAGNOSIS — M79641 Pain in right hand: Secondary | ICD-10-CM | POA: Diagnosis not present

## 2022-09-15 DIAGNOSIS — R200 Anesthesia of skin: Secondary | ICD-10-CM

## 2022-09-15 NOTE — Progress Notes (Signed)
Subjective:  Alisha Coffey is a 47 y.o. female who presents for Chief Complaint  Patient presents with   tingling     Tingling in hands all day long, arms will tingling at night when laying down, been going on a month     Here for tingling in hands.  If lying flat on back in bed and holding phone up, arms start to go numb.   Happens fairly quickly.   Wakes up several times per night with hands numb.  Hands either ache or tingling or numb.  Feels like hands thawing out like if playing in the snow.   Feels achy in joints of the hands, sometimes achiness in wrists.  Went to urgent care once prior for wry neck, and ended up having MRI neck.  Advised she has arthritis in neck affecting arms.  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  Currently laid off though.   Has used wrist splints in remote past due to prior numbness in hands, but that was years ago.  No other aggravating or relieving factors.    No other c/o.  Past Medical History:  Diagnosis Date   Acne    Anxiety    Chronic headache 2018   improved after treatment for anxiety 2018   History of sinusitis    Hypertension    Insomnia    Joint pain    Obesity    Uterine fibroid    Current Outpatient Medications on File Prior to Visit  Medication Sig Dispense Refill   albuterol (VENTOLIN HFA) 108 (90 Base) MCG/ACT inhaler Inhale 2 puffs into the lungs every 6 (six) hours as needed for wheezing or shortness of breath. 1 each 1   ALPRAZolam (XANAX) 1 MG tablet Take 1 mg by mouth daily as needed for anxiety.     cetirizine (ZYRTEC) 10 MG tablet Take 1 tablet (10 mg total) by mouth at bedtime as needed for allergies. 90 tablet 3   Cholecalciferol (VITAMIN D) 50 MCG (2000 UT) CAPS Take 1 capsule (2,000 Units total) by mouth daily. 90 capsule 3   fluticasone (FLONASE) 50 MCG/ACT nasal spray Place 2 sprays into both nostrils daily. 16 g 6   losartan (COZAAR) 50 MG tablet TAKE 1 TABLET BY MOUTH EVERY DAY IN THE EVENING 90  tablet 1   Probiotic Product (UP4 PROBIOTICS WOMENS PO) Take by mouth.     rosuvastatin (CRESTOR) 20 MG tablet Take 1 tablet (20 mg total) by mouth daily. 90 tablet 3   sulfamethoxazole-trimethoprim (BACTRIM DS) 800-160 MG tablet Take 1 tablet by mouth 2 (two) times daily.     tretinoin (RETIN-A) 0.025 % cream Apply topically at bedtime as needed.     triamterene-hydrochlorothiazide (MAXZIDE-25) 37.5-25 MG tablet Take 1 tablet by mouth daily. 90 tablet 1   Continuous Blood Gluc Sensor (FREESTYLE LIBRE 14 DAY SENSOR) MISC 1 each by Does not apply route every 14 (fourteen) days. (Patient not taking: Reported on 09/15/2022) 6 each 3   cyanocobalamin (VITAMIN B12) 1000 MCG tablet Take 1,000 mcg by mouth daily. (Patient not taking: Reported on 08/05/2022)     No current facility-administered medications on file prior to visit.   The following portions of the patient's history were reviewed and updated as appropriate: allergies, current medications, past family history, past medical history, past social history, past surgical history and problem list.  ROS Otherwise as in subjective above   Objective: BP 122/86   Pulse 68   Temp 97.7  F (36.5 C)   Wt 206 lb 9.6 oz (93.7 kg)   LMP 11/12/2018 (Exact Date)   BMI 32.36 kg/m   Wt Readings from Last 3 Encounters:  09/15/22 206 lb 9.6 oz (93.7 kg)  08/05/22 206 lb 9.6 oz (93.7 kg)  05/19/22 201 lb 12.8 oz (91.5 kg)   General appearance: alert, no distress, well developed, well nourished Neck nontender, normal range of motion, supple, no mass Bilateral arms nontender, no swelling, no obvious deformity, normal range of motion of arms and hands with fingers. Negative Phalen' and Tinel's, normal strength and sensation in the fingers and hands but when she is lying flat on her back holding her following up in the air she does start to tingling and numbness in both hands Pulses and cap refill of hands normal   Assessment: Encounter Diagnoses   Name Primary?   Bilateral hand numbness Yes   Pain in both hands      Plan: Symptoms and exam suggest possible combination of carpal tunnel and ulnar nerve syndrome.  Advise carpal tunnel splints over-the-counter at night, daily Aleve OTC for the next week and referral to orthopedic for further evaluation likely to include nerve conduction studies.  Avoid sleeping on bent up arm.    [name] was seen today for tingling .  Diagnoses and all orders for this visit:  Bilateral hand numbness -     AMB referral to orthopedics  Pain in both hands -     AMB referral to orthopedics    Follow up: pending referral

## 2022-09-19 ENCOUNTER — Ambulatory Visit (INDEPENDENT_AMBULATORY_CARE_PROVIDER_SITE_OTHER): Payer: 59 | Admitting: Physician Assistant

## 2022-09-19 ENCOUNTER — Encounter: Payer: Self-pay | Admitting: Physician Assistant

## 2022-09-19 DIAGNOSIS — G5602 Carpal tunnel syndrome, left upper limb: Secondary | ICD-10-CM

## 2022-09-19 DIAGNOSIS — M542 Cervicalgia: Secondary | ICD-10-CM | POA: Diagnosis not present

## 2022-09-19 DIAGNOSIS — G5601 Carpal tunnel syndrome, right upper limb: Secondary | ICD-10-CM

## 2022-09-19 DIAGNOSIS — R200 Anesthesia of skin: Secondary | ICD-10-CM | POA: Diagnosis not present

## 2022-09-19 NOTE — Progress Notes (Signed)
Office Visit Note   Patient: Alisha Coffey           Date of Birth: Jun 17, 1976           MRN: [id number] Visit Date: 09/19/2022              Requested by: Carlena Hurl, PA-C [address] [place],  [place] [address] PCP: Carlena Hurl, PA-C   Assessment & Plan: Visit Diagnoses:  1. Bilateral hand numbness   2. Cervicalgia   3. Carpal tunnel syndrome, left upper limb   4. Carpal tunnel syndrome, right upper limb     Plan: Ms. Cheever has a long history of bilateral hand numbness right greater than left and it is getting worse.  She feels like her hands fall asleep at night.  It used to be intermittent but now it is daily.  She reports a history of this for many years.  She was once told she might have carpal tunnel syndrome but does not have any studies.  She has tried alternative medical treatments including rec reflexology massage.  She has tried braces and has not gotten better.  She also complains of some neck pain and back pain.  Her hands fall asleep when she is driving her car.  On exam she does have a positive Phalen sign very quickly especially on the right.  While some of this could be coming from her neck I think to explore her carpal tunnel would be most appropriate because it is the most difficult problem for her.  If she does have significant carpal tunnel I would refer her to one of our surgeons.  If she has little carpal tunnel we would have to reevaluate her cervical spine  Follow-Up Instructions: No follow-ups on file.   Orders:  Orders Placed This Encounter  Procedures   Ambulatory referral to Physical Medicine Rehab   Ambulatory referral to Physical Therapy   No orders of the defined types were placed in this encounter.     Procedures: No procedures performed   Clinical Data: No additional findings.   Subjective: Chief Complaint  Patient presents with   Right Hand - Numbness   Left Hand - Numbness    HPI pleasant 47 year old woman with  long history of right greater than left hand numbness.  Denies any injuries.  Does have some back and neck problems but does not think it is related.  She has tried many conservative treatments and medications but the numbness is starting to get progressive  Review of Systems  All other systems reviewed and are negative.    Objective: Vital Signs: LMP 11/12/2018 (Exact Date)   Physical Exam Constitutional:      Appearance: Normal appearance.  Pulmonary:     Effort: Pulmonary effort is normal.  Skin:    General: Skin is warm and dry.  Neurological:     General: No focal deficit present.     Mental Status: She is alert.  Psychiatric:        Mood and Affect: Mood normal.     Ortho Exam Examination of her neck she has good movement of her neck does not reproduce the numbness in her hands.  She has good circulation and cannot appreciate any significant thenar or hypothenar atrophy in either of her hands.  Her hands are warm with brisk capillary refill.  Phalen's testing quickly reproduces symptoms in the first 3 digits of her right hand.  As time progresses also in the left.  She has a negative Tinel's sign.  Strength overall is intact. Specialty Comments:  No specialty comments available.  Imaging: No results found.   PMFS History: Patient Active Problem List   Diagnosis Date Noted   Carpal tunnel syndrome, left upper limb 09/19/2022   Carpal tunnel syndrome, right upper limb 09/19/2022   Recurrent boils 03/24/2022   Benign hypertension 03/24/2022   Extrinsic asthma 10/18/2021   Vitamin D deficiency 10/14/2021   Leucocytosis 02/18/2021   Hyperlipidemia 02/18/2021   Primary hypertension 02/18/2021   History of uterine fibroid 12/23/2020   Allergic rhinitis due to pollen 12/23/2020   Impaired fasting blood sugar 12/23/2020   Encounter for hepatitis C screening test for low risk patient 28/78/6767   Folliculitis 20/94/7096   History of hysterectomy 08/2020   Iron  deficiency anemia 09/30/2019   Anxiety 08/20/2019   Mild intermittent asthma 11/22/2018   Polyarthralgia 04/20/2018   Encounter for health maintenance examination in adult 11/24/2017   Vaccine counseling 11/24/2017   Routine general medical examination at a health care facility 10/04/2011   Chronic headaches 10/04/2011   Past Medical History:  Diagnosis Date   Acne    Anxiety    Chronic headache 2018   improved after treatment for anxiety 2018   History of sinusitis    Hypertension    Insomnia    Joint pain    Obesity    Uterine fibroid     Family History  Problem Relation Age of Onset   Hypertension Mother    Kidney Stones Mother    Stroke Maternal Aunt    Depression Maternal Grandmother    Hypertension Maternal Grandmother    Diabetes Maternal Grandfather    Hypertension Maternal Grandfather    Cancer Neg Hx    Heart disease Neg Hx     Past Surgical History:  Procedure Laterality Date   ABDOMINAL HYSTERECTOMY N/A 09/07/2020   Procedure: HYSTERECTOMY ABDOMINAL;  Surgeon: Sanjuana Kava, MD;  Location: [place];  Service: Gynecology;  Laterality: N/A;   BILATERAL SALPINGECTOMY Bilateral 09/07/2020   Procedure: OPEN BILATERAL SALPINGECTOMY;  Surgeon: Sanjuana Kava, MD;  Location: [place];  Service: Gynecology;  Laterality: Bilateral;   BREAST REDUCTION SURGERY  2009, 2000   HERNIA REPAIR     REDUCTION MAMMAPLASTY Bilateral 2000   REDUCTION MAMMAPLASTY Bilateral 12/2007   WISDOM TOOTH EXTRACTION     Social History   Occupational History   Occupation: Medical sales representative: [employer]    Comment: [employer]  Tobacco Use   Smoking status: Never   Smokeless tobacco: Never  Vaping Use   Vaping Use: Never used  Substance and Sexual Activity   Alcohol use: Yes    Alcohol/week: 1.0 standard drink of alcohol    Types: 1 Glasses of wine per week    Comment: occasional   Drug use: Not Currently    Types: Marijuana    Comment: OCC    Sexual activity: Yes    Birth control/protection: Surgical

## 2022-09-27 ENCOUNTER — Ambulatory Visit: Payer: 59 | Admitting: Physical Therapy

## 2022-09-27 ENCOUNTER — Other Ambulatory Visit: Payer: Self-pay

## 2022-09-27 ENCOUNTER — Encounter: Payer: Self-pay | Admitting: Physical Therapy

## 2022-09-27 DIAGNOSIS — M6281 Muscle weakness (generalized): Secondary | ICD-10-CM

## 2022-09-27 DIAGNOSIS — R293 Abnormal posture: Secondary | ICD-10-CM

## 2022-09-27 DIAGNOSIS — M542 Cervicalgia: Secondary | ICD-10-CM
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
## 2022-09-28 ENCOUNTER — Ambulatory Visit (INDEPENDENT_AMBULATORY_CARE_PROVIDER_SITE_OTHER): Payer: 59 | Admitting: Physical Medicine and Rehabilitation

## 2022-09-28 DIAGNOSIS — R202 Paresthesia of skin: Secondary | ICD-10-CM | POA: Diagnosis not present

## 2022-09-28 NOTE — Procedures (Signed)
EMG & NCV Findings: Evaluation of the left median motor and the right median motor nerves showed prolonged distal onset latency (L4.9, R4.8 ms) and decreased conduction velocity (Elbow-Wrist, L49, R47 m/s).  The left median (across palm) sensory nerve showed prolonged distal peak latency (Wrist, 5.6 ms), reduced amplitude (7.9 V), and prolonged distal peak latency (Palm, 5.2 ms).  The right median (across palm) sensory nerve showed prolonged distal peak latency (Wrist, 5.0 ms) and prolonged distal peak latency (Palm, 3.7 ms).  All remaining nerves (as indicated in the following tables) were within normal limits.  Left vs. Right side comparison data for the ulnar motor nerve indicates abnormal L-R velocity difference (A Elbow-B Elbow, 21 m/s).  All remaining left vs. right side differences were within normal limits.    All examined muscles (as indicated in the following table) showed no evidence of electrical instability.    Impression: The above electrodiagnostic study is ABNORMAL and reveals evidence of a moderate to severe bilateral median nerve entrapment at the wrist (carpal tunnel syndrome) affecting sensory and motor components.   There is no significant electrodiagnostic evidence of any other focal nerve entrapment, brachial plexopathy or cervical radiculopathy.   Recommendations: 1.  Follow-up with referring physician. 2.  Continue current management of symptoms. 3.  Continue use of resting splint at night-time and as needed during the day. 4.  Suggest surgical evaluation.  ___________________________ Laurence Spates FAAPMR Board Certified, American Board of Physical Medicine and Rehabilitation    Nerve Conduction Studies Anti Sensory Summary Table   Stim Site NR Peak (ms) Norm Peak (ms) P-T Amp (V) Norm P-T Amp Site1 Site2 Delta-P (ms) Dist (cm) Vel (m/s) Norm Vel (m/s)  Left Median Acr Palm Anti Sensory (2nd Digit)  32C  Wrist    *5.6 <3.6 *7.9 >10 Wrist Palm 0.4 0.0    Palm     *5.2 <2.0 11.8         Right Median Acr Palm Anti Sensory (2nd Digit)  31.2C  Wrist    *5.0 <3.6 10.9 >10 Wrist Palm 1.3 0.0    Palm    *3.7 <2.0 3.8         Left Radial Anti Sensory (Base 1st Digit)  32C  Wrist    1.8 <3.1 26.2  Wrist Base 1st Digit 1.8 0.0    Right Radial Anti Sensory (Base 1st Digit)  32.1C  Wrist    1.9 <3.1 43.3  Wrist Base 1st Digit 1.9 0.0    Left Ulnar Anti Sensory (5th Digit)  32.3C  Wrist    3.0 <3.7 35.3 >15.0 Wrist 5th Digit 3.0 14.0 47 >38  Right Ulnar Anti Sensory (5th Digit)  32.3C  Wrist    2.9 <3.7 29.4 >15.0 Wrist 5th Digit 2.9 14.0 48 >38   Motor Summary Table   Stim Site NR Onset (ms) Norm Onset (ms) O-P Amp (mV) Norm O-P Amp Site1 Site2 Delta-0 (ms) Dist (cm) Vel (m/s) Norm Vel (m/s)  Left Median Motor (Abd Poll Brev)  32C    Martin-Gruber  Wrist    *4.9 <4.2 6.8 >5 Elbow Wrist 4.5 22.0 *49 >50  Elbow    9.4  7.9         Right Median Motor (Abd Poll Brev)  32.2C    Martin-Gruber  Wrist    *4.8 <4.2 5.2 >5 Elbow Wrist 4.3 20.0 *47 >50  Elbow    9.1  3.2         Left Ulnar Motor (Abd Dig  Min)  32.3C  Wrist    2.3 <4.2 11.2 >3 B Elbow Wrist 3.2 18.5 58 >53  B Elbow    5.5  7.3  A Elbow B Elbow 1.4 11.0 79 >53  A Elbow    6.9  7.7         Right Ulnar Motor (Abd Dig Min)  32.1C  Wrist    2.3 <4.2 11.6 >3 B Elbow Wrist 3.2 20.0 63 >53  B Elbow    5.5  7.2  A Elbow B Elbow 1.2 12.0 100 >53  A Elbow    6.7  4.4          EMG   Side Muscle Nerve Root Ins Act Fibs Psw Amp Dur Poly Recrt Int Fraser Din Comment  Right Abd Poll Brev Median C8-T1 Nml Nml Nml Nml Nml 0 Nml Nml   Right 1stDorInt Ulnar C8-T1 Nml Nml Nml Nml Nml 0 Nml Nml   Right PronatorTeres Median C6-7 Nml Nml Nml Nml Nml 0 Nml Nml   Right Biceps Musculocut C5-6 Nml Nml Nml Nml Nml 0 Nml Nml   Right Deltoid Axillary C5-6 Nml Nml Nml Nml Nml 0 Nml Nml     Nerve Conduction Studies Anti Sensory Left/Right Comparison   Stim Site L Lat (ms) R Lat (ms) L-R Lat (ms) L Amp (V) R Amp (V)  L-R Amp (%) Site1 Site2 L Vel (m/s) R Vel (m/s) L-R Vel (m/s)  Median Acr Palm Anti Sensory (2nd Digit)  32C  Wrist *5.6 *5.0 0.6 *7.9 10.9 27.5 Wrist Palm     Palm *5.2 *3.7 1.5 11.8 3.8 67.8       Radial Anti Sensory (Base 1st Digit)  32C  Wrist 1.8 1.9 0.1 26.2 43.3 39.5 Wrist Base 1st Digit     Ulnar Anti Sensory (5th Digit)  32.3C  Wrist 3.0 2.9 0.1 35.3 29.4 16.7 Wrist 5th Digit 47 48 1   Motor Left/Right Comparison   Stim Site L Lat (ms) R Lat (ms) L-R Lat (ms) L Amp (mV) R Amp (mV) L-R Amp (%) Site1 Site2 L Vel (m/s) R Vel (m/s) L-R Vel (m/s)  Median Motor (Abd Poll Brev)  32C    Martin-Gruber  Wrist *4.9 *4.8 0.1 6.8 5.2 23.5 Elbow Wrist *49 *47 2  Elbow 9.4 9.1 0.3 7.9 3.2 59.5       Ulnar Motor (Abd Dig Min)  32.3C  Wrist 2.3 2.3 0.0 11.2 11.6 3.4 B Elbow Wrist 58 63 5  B Elbow 5.5 5.5 0.0 7.3 7.2 1.4 A Elbow B Elbow 79 100 *21  A Elbow 6.9 6.7 0.2 7.7 4.4 42.9          Waveforms:

## 2022-09-28 NOTE — Progress Notes (Signed)
Functional Pain Scale - descriptive words and definitions  Moderate (4)   Constantly aware of pain, can complete ADLs with modification/sleep marginally affected at times/passive distraction is of no use, but active distraction gives some relief. Moderate range order  Average Pain 4  Right handed. Pain in hands and wrists with numbness and tingling. Numbness goes all the way up the arm. Hands become achy, right is worse

## 2022-09-29 ENCOUNTER — Other Ambulatory Visit: Payer: Self-pay | Admitting: Physician Assistant

## 2022-09-29 MED ORDER — TRAMADOL HCL 50 MG PO TABS: 50.0000 mg | ORAL_TABLET | Freq: Two times a day (BID) | ORAL | 0 refills | Status: DC | PRN

## 2022-09-30 ENCOUNTER — Ambulatory Visit: Payer: 59 | Admitting: Physician Assistant

## 2022-10-04 ENCOUNTER — Ambulatory Visit: Payer: 59 | Admitting: Physical Therapy

## 2022-10-04 ENCOUNTER — Encounter: Payer: Self-pay | Admitting: Physical Therapy

## 2022-10-04 ENCOUNTER — Other Ambulatory Visit: Payer: Self-pay

## 2022-10-04 DIAGNOSIS — M542 Cervicalgia: Secondary | ICD-10-CM

## 2022-10-04 DIAGNOSIS — M6281 Muscle weakness (generalized): Secondary | ICD-10-CM | POA: Diagnosis not present

## 2022-10-04 DIAGNOSIS — R293 Abnormal posture: Secondary | ICD-10-CM | POA: Diagnosis not present

## 2022-10-04 NOTE — Progress Notes (Signed)
Alisha Coffey - 47 y.o. female MRN [id number]  Date of birth: [birth year]  Office Visit Note: Visit Date: 09/28/2022 PCP: Carlena Hurl, PA-C Referred by: Persons, Bevely Palmer, PA  Subjective: Chief Complaint  Patient presents with   Right Wrist - Numbness, Pain   Left Wrist - Numbness, Pain   Left Hand - Numbness, Pain   Right Hand - Numbness, Pain   HPI:  Alisha Coffey is a 47 y.o. female who comes in today at the request of Maynard, PA-C for evaluation and management of chronic, worsening and severe pain, numbness and tingling in the Bilateral upper extremities.  Patient is Right hand dominant.  She reports bilateral right more than left hand pain with numbness and tingling over the last many years and progressively worsening.  Rates her pain and function as a moderate 4 out of 10 and is constantly aware of the pain and it does affect her daily living.  She has been felt to have carpal tunnel syndrome in the past and has sought out alternative treatments but also bracing without much relief.  She is prediabetic but does not take any diabetic medications and last hemoglobin A1c was 6.1.  She has no prior electrodiagnostic studies to review.  No frank radicular symptoms.   ROS Otherwise per HPI.  Assessment & Plan: Visit Diagnoses:    ICD-10-CM   1. Paresthesia of skin  R20.2 NCV with EMG (electromyography)      Plan: Impression: The above electrodiagnostic study is ABNORMAL and reveals evidence of a moderate to severe bilateral median nerve entrapment at the wrist (carpal tunnel syndrome) affecting sensory and motor components.   There is no significant electrodiagnostic evidence of any other focal nerve entrapment, brachial plexopathy or cervical radiculopathy.   Recommendations: 1.  Follow-up with referring physician. 2.  Continue current management of symptoms. 3.  Continue use of resting splint at night-time and as needed during the day. 4.  Suggest surgical  evaluation.  Meds & Orders: No orders of the defined types were placed in this encounter.   Orders Placed This Encounter  Procedures   NCV with EMG (electromyography)    Follow-up: Return for [place], PA-C.   Procedures: No procedures performed  EMG & NCV Findings: Evaluation of the left median motor and the right median motor nerves showed prolonged distal onset latency (L4.9, R4.8 ms) and decreased conduction velocity (Elbow-Wrist, L49, R47 m/s).  The left median (across palm) sensory nerve showed prolonged distal peak latency (Wrist, 5.6 ms), reduced amplitude (7.9 V), and prolonged distal peak latency (Palm, 5.2 ms).  The right median (across palm) sensory nerve showed prolonged distal peak latency (Wrist, 5.0 ms) and prolonged distal peak latency (Palm, 3.7 ms).  All remaining nerves (as indicated in the following tables) were within normal limits.  Left vs. Right side comparison data for the ulnar motor nerve indicates abnormal L-R velocity difference (A Elbow-B Elbow, 21 m/s).  All remaining left vs. right side differences were within normal limits.    All examined muscles (as indicated in the following table) showed no evidence of electrical instability.    Impression: The above electrodiagnostic study is ABNORMAL and reveals evidence of a moderate to severe bilateral median nerve entrapment at the wrist (carpal tunnel syndrome) affecting sensory and motor components.   There is no significant electrodiagnostic evidence of any other focal nerve entrapment, brachial plexopathy or cervical radiculopathy.   Recommendations: 1.  Follow-up with referring physician. 2.  Continue current management of symptoms. 3.  Continue use of resting splint at night-time and as needed during the day. 4.  Suggest surgical evaluation.  ___________________________ Laurence Spates FAAPMR Board Certified, American Board of Physical Medicine and Rehabilitation    Nerve Conduction  Studies Anti Sensory Summary Table   Stim Site NR Peak (ms) Norm Peak (ms) P-T Amp (V) Norm P-T Amp Site1 Site2 Delta-P (ms) Dist (cm) Vel (m/s) Norm Vel (m/s)  Left Median Acr Palm Anti Sensory (2nd Digit)  32C  Wrist    *5.6 <3.6 *7.9 >10 Wrist Palm 0.4 0.0    Palm    *5.2 <2.0 11.8         Right Median Acr Palm Anti Sensory (2nd Digit)  31.2C  Wrist    *5.0 <3.6 10.9 >10 Wrist Palm 1.3 0.0    Palm    *3.7 <2.0 3.8         Left Radial Anti Sensory (Base 1st Digit)  32C  Wrist    1.8 <3.1 26.2  Wrist Base 1st Digit 1.8 0.0    Right Radial Anti Sensory (Base 1st Digit)  32.1C  Wrist    1.9 <3.1 43.3  Wrist Base 1st Digit 1.9 0.0    Left Ulnar Anti Sensory (5th Digit)  32.3C  Wrist    3.0 <3.7 35.3 >15.0 Wrist 5th Digit 3.0 14.0 47 >38  Right Ulnar Anti Sensory (5th Digit)  32.3C  Wrist    2.9 <3.7 29.4 >15.0 Wrist 5th Digit 2.9 14.0 48 >38   Motor Summary Table   Stim Site NR Onset (ms) Norm Onset (ms) O-P Amp (mV) Norm O-P Amp Site1 Site2 Delta-0 (ms) Dist (cm) Vel (m/s) Norm Vel (m/s)  Left Median Motor (Abd Poll Brev)  32C    Martin-Gruber  Wrist    *4.9 <4.2 6.8 >5 Elbow Wrist 4.5 22.0 *49 >50  Elbow    9.4  7.9         Right Median Motor (Abd Poll Brev)  32.2C    Martin-Gruber  Wrist    *4.8 <4.2 5.2 >5 Elbow Wrist 4.3 20.0 *47 >50  Elbow    9.1  3.2         Left Ulnar Motor (Abd Dig Min)  32.3C  Wrist    2.3 <4.2 11.2 >3 B Elbow Wrist 3.2 18.5 58 >53  B Elbow    5.5  7.3  A Elbow B Elbow 1.4 11.0 79 >53  A Elbow    6.9  7.7         Right Ulnar Motor (Abd Dig Min)  32.1C  Wrist    2.3 <4.2 11.6 >3 B Elbow Wrist 3.2 20.0 63 >53  B Elbow    5.5  7.2  A Elbow B Elbow 1.2 12.0 100 >53  A Elbow    6.7  4.4          EMG   Side Muscle Nerve Root Ins Act Fibs Psw Amp Dur Poly Recrt Int Fraser Din Comment  Right Abd Poll Brev Median C8-T1 Nml Nml Nml Nml Nml 0 Nml Nml   Right 1stDorInt Ulnar C8-T1 Nml Nml Nml Nml Nml 0 Nml Nml   Right PronatorTeres Median C6-7 Nml Nml Nml  Nml Nml 0 Nml Nml   Right Biceps Musculocut C5-6 Nml Nml Nml Nml Nml 0 Nml Nml   Right Deltoid Axillary C5-6 Nml Nml Nml Nml Nml 0 Nml Nml     Nerve Conduction Studies Anti Sensory  Left/Right Comparison   Stim Site L Lat (ms) R Lat (ms) L-R Lat (ms) L Amp (V) R Amp (V) L-R Amp (%) Site1 Site2 L Vel (m/s) R Vel (m/s) L-R Vel (m/s)  Median Acr Palm Anti Sensory (2nd Digit)  32C  Wrist *5.6 *5.0 0.6 *7.9 10.9 27.5 Wrist Palm     Palm *5.2 *3.7 1.5 11.8 3.8 67.8       Radial Anti Sensory (Base 1st Digit)  32C  Wrist 1.8 1.9 0.1 26.2 43.3 39.5 Wrist Base 1st Digit     Ulnar Anti Sensory (5th Digit)  32.3C  Wrist 3.0 2.9 0.1 35.3 29.4 16.7 Wrist 5th Digit 47 48 1   Motor Left/Right Comparison   Stim Site L Lat (ms) R Lat (ms) L-R Lat (ms) L Amp (mV) R Amp (mV) L-R Amp (%) Site1 Site2 L Vel (m/s) R Vel (m/s) L-R Vel (m/s)  Median Motor (Abd Poll Brev)  32C    Martin-Gruber  Wrist *4.9 *4.8 0.1 6.8 5.2 23.5 Elbow Wrist *49 *47 2  Elbow 9.4 9.1 0.3 7.9 3.2 59.5       Ulnar Motor (Abd Dig Min)  32.3C  Wrist 2.3 2.3 0.0 11.2 11.6 3.4 B Elbow Wrist 58 63 5  B Elbow 5.5 5.5 0.0 7.3 7.2 1.4 A Elbow B Elbow 79 100 *21  A Elbow 6.9 6.7 0.2 7.7 4.4 42.9          Waveforms:                    Clinical History: MRI CERVICAL SPINE WITHOUT CONTRAST  TECHNIQUE: Multiplanar, multisequence MR imaging of the cervical spine was performed. No intravenous contrast was administered.  COMPARISON: Radiographs of the cervical spine 11/29/2021 (images available, report unavailable).  FINDINGS: Alignment: Straightening of the expected cervical lordosis. Trace C4-C5 grade 1 anterolisthesis.  Vertebrae: Vertebral body height is maintained. No significant marrow edema or focal suspicious osseous lesion.  Cord: No signal abnormality identified within the cervical spinal cord.  Posterior Fossa, vertebral arteries, paraspinal tissues: No abnormality identified within included portions of  the posterior fossa. Flow voids preserved within the imaged cervical vertebral arteries. Paraspinal soft tissues unremarkable.  Disc levels:  Multilevel disc degeneration. Most notably, mild-to-moderate disc degeneration is present at C5-C6 and C6-C7.  C2-C3: No significant disc herniation or stenosis.  C3-C4: No significant disc herniation or stenosis.  C4-C5: Trace grade 1 anterolisthesis. Slight disc uncovering. No significant spinal canal or foraminal stenosis.  C5-C6: Shallow disc bulge. Uncovertebral hypertrophy (greater on the right). The disc bulge effaces the ventral thecal sac, contacting and minimally flattening the ventral aspect of the spinal cord. However, the dorsal CSF space is maintained within the spinal canal. Mild right neural foraminal narrowing.  C6-C7: Shallow broad-based central disc protrusion. The disc protrusion effaces the ventral thecal sac, contacting and minimally flattening the ventral aspect of the spinal cord. However, the dorsal CSF space is maintained within the spinal canal. No significant foraminal stenosis.  C7-T1: No significant disc herniation or stenosis.  T1-T2: This level is imaged in the sagittal plane only. Shallow disc bulge without significant spinal canal stenosis.  IMPRESSION: Cervical spondylosis, as outlined and with findings most notably as follows.  At C5-C6, there is mild-to-moderate disc degeneration. Shallow disc bulge. Uncovertebral hypertrophy (greater on the right). The disc bulge effaces the ventral thecal sac, contacting and minimally flattening the ventral aspect of the spinal cord. However, the dorsal CSF space is maintained within the spinal  canal. Mild right neural foraminal narrowing.  At C6-C7, there is a shallow broad-based central disc protrusion which effaces the ventral thecal sac, contacting and minimally flattening the ventral aspect of the spinal cord. However, the dorsal CSF space is maintained  within the spinal canal.  No significant spinal canal or foraminal stenosis at the remaining levels.  Nonspecific straightening of the expected cervical lordosis.  Trace C4-C5 grade 1 anterolisthesis.   Electronically Signed By: Kellie Simmering D.O. On: 12/08/2021 18:17     Objective:  VS:  HT:    WT:   BMI:     BP:   HR: bpm  TEMP: ( )  RESP:  Physical Exam Musculoskeletal:        General: No swelling, tenderness or deformity.     Comments: Inspection reveals no atrophy of the bilateral APB or FDI or hand intrinsics. There is no swelling, color changes, allodynia or dystrophic changes. There is 5 out of 5 strength in the bilateral wrist extension, finger abduction and long finger flexion. There is intact sensation to light touch in all dermatomal and peripheral nerve distributions. There is a negative Tinel's test at the bilateral wrist and elbow. There is a positive Phalen's test bilaterally. There is a negative Hoffmann's test bilaterally.  Skin:    General: Skin is warm and dry.     Findings: No erythema or rash.  Neurological:     General: No focal deficit present.     Mental Status: She is alert and oriented to person, place, and time.     Motor: No weakness or abnormal muscle tone.     Coordination: Coordination normal.  Psychiatric:        Mood and Affect: Mood normal.        Behavior: Behavior normal.      Imaging: No results found.
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
## 2022-10-06 ENCOUNTER — Encounter: Payer: 59 | Admitting: Physical Therapy

## 2022-10-11 ENCOUNTER — Ambulatory Visit: Payer: 59 | Admitting: Orthopaedic Surgery

## 2022-10-11 ENCOUNTER — Encounter: Payer: 59 | Admitting: Physical Therapy

## 2022-10-14 ENCOUNTER — Ambulatory Visit (INDEPENDENT_AMBULATORY_CARE_PROVIDER_SITE_OTHER): Payer: 59 | Admitting: Physical Therapy

## 2022-10-14 ENCOUNTER — Encounter: Payer: Self-pay | Admitting: Physical Therapy

## 2022-10-14 DIAGNOSIS — M542 Cervicalgia: Secondary | ICD-10-CM

## 2022-10-14 DIAGNOSIS — R293 Abnormal posture: Secondary | ICD-10-CM

## 2022-10-14 DIAGNOSIS — M6281 Muscle weakness (generalized): Secondary | ICD-10-CM | POA: Diagnosis not present
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
## 2022-10-17 ENCOUNTER — Encounter: Payer: Self-pay | Admitting: Orthopaedic Surgery

## 2022-10-18 ENCOUNTER — Other Ambulatory Visit (INDEPENDENT_AMBULATORY_CARE_PROVIDER_SITE_OTHER): Payer: 59

## 2022-10-18 ENCOUNTER — Ambulatory Visit: Payer: 59 | Admitting: Orthopaedic Surgery

## 2022-10-18 ENCOUNTER — Telehealth: Payer: 59 | Admitting: Medical

## 2022-10-18 VITALS — Temp 100.0°F | Wt 208.0 lb

## 2022-10-18 DIAGNOSIS — R6889 Other general symptoms and signs: Secondary | ICD-10-CM

## 2022-10-18 DIAGNOSIS — R0602 Shortness of breath: Secondary | ICD-10-CM

## 2022-10-18 DIAGNOSIS — R7301 Impaired fasting glucose: Secondary | ICD-10-CM

## 2022-10-18 DIAGNOSIS — R0981 Nasal congestion: Secondary | ICD-10-CM

## 2022-10-18 DIAGNOSIS — R52 Pain, unspecified: Secondary | ICD-10-CM | POA: Diagnosis not present

## 2022-10-18 DIAGNOSIS — R0989 Other specified symptoms and signs involving the circulatory and respiratory systems: Secondary | ICD-10-CM | POA: Diagnosis not present

## 2022-10-18 DIAGNOSIS — E669 Obesity, unspecified: Secondary | ICD-10-CM

## 2022-10-18 LAB — POCT INFLUENZA A/B
Influenza A, POC: NEGATIVE
Influenza B, POC: NEGATIVE

## 2022-10-18 LAB — POC COVID19 BINAXNOW: SARS Coronavirus 2 Ag: NEGATIVE

## 2022-10-18 LAB — POCT RESPIRATORY SYNCYTIAL VIRUS: RSV Rapid Ag: NEGATIVE

## 2022-10-18 MED ORDER — BUDESONIDE-FORMOTEROL FUMARATE 160-4.5 MCG/ACT IN AERO: 2.0000 | INHALATION_SPRAY | Freq: Two times a day (BID) | RESPIRATORY_TRACT | 2 refills | Status: DC

## 2022-10-18 MED ORDER — PREDNISONE 10 MG PO TABS: ORAL_TABLET | ORAL | 0 refills | Status: DC

## 2022-10-18 MED ORDER — WEGOVY 0.5 MG/0.5ML ~~LOC~~ SOAJ: 0.5000 mg | SUBCUTANEOUS | 0 refills | Status: DC

## 2022-10-18 MED ORDER — WEGOVY 0.25 MG/0.5ML ~~LOC~~ SOAJ: 0.2500 mg | SUBCUTANEOUS | 0 refills | Status: DC

## 2022-10-18 NOTE — Progress Notes (Signed)
Subjective:     Patient ID: Alisha Coffey, female   DOB: Jul 29, 1976, 47 y.o.   MRN: [id number]  This visit type was conducted due to national recommendations for restrictions regarding the COVID-19 Pandemic (e.g. social distancing) in an effort to limit this patient's exposure and mitigate transmission in our community.  Due to their co-morbid illnesses, this patient is at least at moderate risk for complications without adequate follow up.  This format is felt to be most appropriate for this patient at this time.    Documentation for virtual audio and video telecommunications through [place] encounter:  The patient was located at home. The provider was located in the office. The patient did consent to this visit and is aware of possible charges through their insurance for this visit.  The other persons participating in this telemedicine service were none. Time spent on call was 20 minutes and in review of previous records 20 minutes total.  This virtual service is not related to other E/M service within previous 7 days.   HPI Chief Complaint  Patient presents with   URI    Upper Respiratory infection- SOB, coughing up yellow, nasal and chest congestion, no cough, rattle in chest. Symptoms started Sunday- runny nose, itchy watery eye,low grade fever. Really fatigue. Covid test negative but expired.    Virtual for cough.  Symptoms began 2.5 days ago.  Started with sneezing, watery eyes, raspy throat, then next day fever up to 100, ongoing eye watery, chest congestion, head congestion, feels  alittle SOB, some cough, some chest/back ache from congestion.  Feels fatigue.  No NVD.  Using inhaler some.  Having some aches, some chills.   Has had covid before, and this doesn't feel as bad as with covid.   Did home covid test but test was 5 months expired.   Using flonase, zyrtec and some robitusin.  No other aggravating or relieving factors. No other complaint.  Past Medical History:   Diagnosis Date   Acne    Anxiety    Chronic headache 2018   improved after treatment for anxiety 2018   History of sinusitis    Hypertension    Insomnia    Joint pain    Obesity    Uterine fibroid    Current Outpatient Medications on File Prior to Visit  Medication Sig Dispense Refill   albuterol (VENTOLIN HFA) 108 (90 Base) MCG/ACT inhaler Inhale 2 puffs into the lungs every 6 (six) hours as needed for wheezing or shortness of breath. 1 each 1   ALPRAZolam (XANAX) 1 MG tablet Take 1 mg by mouth daily as needed for anxiety.     cetirizine (ZYRTEC) 10 MG tablet Take 1 tablet (10 mg total) by mouth at bedtime as needed for allergies. 90 tablet 3   Cholecalciferol (VITAMIN D) 50 MCG (2000 UT) CAPS Take 1 capsule (2,000 Units total) by mouth daily. 90 capsule 3   Continuous Blood Gluc Sensor (FREESTYLE LIBRE 14 DAY SENSOR) MISC 1 each by Does not apply route every 14 (fourteen) days. 6 each 3   fluticasone (FLONASE) 50 MCG/ACT nasal spray Place 2 sprays into both nostrils daily. 16 g 6   losartan (COZAAR) 50 MG tablet TAKE 1 TABLET BY MOUTH EVERY DAY IN THE EVENING 90 tablet 1   Probiotic Product (UP4 PROBIOTICS WOMENS PO) Take by mouth.     rosuvastatin (CRESTOR) 20 MG tablet Take 1 tablet (20 mg total) by mouth daily. 90 tablet 3   tretinoin (RETIN-A) 0.025 %  cream Apply topically at bedtime as needed.     triamterene-hydrochlorothiazide (MAXZIDE-25) 37.5-25 MG tablet Take 1 tablet by mouth daily. 90 tablet 1   traMADol (ULTRAM) 50 MG tablet Take 1 tablet (50 mg total) by mouth every 12 (twelve) hours as needed. (Patient not taking: Reported on 10/18/2022) 10 tablet 0   No current facility-administered medications on file prior to visit.   Review of Systems As in subjective    Objective:   Physical Exam  Due to coronavirus pandemic stay at home measures, patient visit was virtual and they were not examined in person.    Temp 100 F (37.8 C)   Wt 208 lb (94.3 kg)   LMP  11/12/2018 (Exact Date)   BMI 32.58 kg/m  144/92  Gen: wd, wn, nad Mildly ill appearing Somewhat diaphoretic      Assessment:     Encounter Diagnoses  Name Primary?   Flu-like symptoms Yes   SOB (shortness of breath)    Body aches    Impaired fasting glucose    Obesity, unspecified classification, unspecified obesity type, unspecified whether serious comorbidity present        Plan:     We discussed symptoms and concerns.  She will report to our back parking lot now for testing for flu COVID and RSV screening  Advised rest, hydration, continue albuterol rescue inhaler as needed given her underlying asthma and shortness of breath.  Add Symbicort now for the next 2 weeks.  Discussed proper use of medication.  If worse in the next day or 2 or 3 can add the prednisone oral medication  Can continue Robitussin DM or Coricidin HBP over-the-counter for symptoms  Self quarantine for now until we have results  She came to our office for testing: Negative for flu COVID and RSV.  Continue same plan above.  We will treat this as a viral URI currently but if symptoms worsen over the next few days particularly trouble breathing then recheck or call back.  Unrelated, refilled Wegovy as she has had a hard time getting this medicine anywhere else.  We will try a mail order pharmacy in [facility] was seen today for uri.  Diagnoses and all orders for this visit:  Flu-like symptoms  SOB (shortness of breath)  Body aches  Impaired fasting glucose  Obesity, unspecified classification, unspecified obesity type, unspecified whether serious comorbidity present  Other orders -     budesonide-formoterol (SYMBICORT) 160-4.5 MCG/ACT inhaler; Inhale 2 puffs into the lungs 2 (two) times daily. -     predniSONE (DELTASONE) 10 MG tablet; 6 tablets all together day 1, 5 tablets day 2, 4 tablets day 3, 3 tablets day 4, 2 tablets day 5, 1 tablet day 6. -     Semaglutide-Weight  Management (WEGOVY) 0.25 MG/0.5ML SOAJ; Inject 0.25 mg into the skin once a week. -     Semaglutide-Weight Management (WEGOVY) 0.5 MG/0.5ML SOAJ; Inject 0.5 mg into the skin once a week.    F/u prn

## 2022-10-18 NOTE — Progress Notes (Signed)
You are negative for COVID flu and RSV.  Just this could just be a viral respiratory tract infection or cold.  I recommend rest, hydration, continue albuterol rescue inhaler as needed given her underlying asthma and shortness of breath.    Add Symbicort maintenance inhaler, long-acting inhaler now for the next 2 weeks.  If worse in the next day or 2 or 3 can add the prednisone oral medication  You can use Robitussin DM or Coricidin HBP over-the-counter for symptoms  if symptoms worsen over the next few days, particularly trouble breathing then recheck or call back.  He has significant change in symptoms the next few days we could also consider chest x-ray if needed.  Continue her blood pressure pills as usual

## 2022-10-19 ENCOUNTER — Encounter: Payer: 59 | Admitting: Physical Therapy

## 2022-10-26 ENCOUNTER — Ambulatory Visit: Payer: 59 | Admitting: Orthopaedic Surgery

## 2023-01-26 ENCOUNTER — Ambulatory Visit: Payer: 59 | Admitting: Medical

## 2023-01-26 VITALS — BP 112/70 | HR 96 | Temp 97.1°F | Wt 214.4 lb

## 2023-01-26 DIAGNOSIS — H6993 Unspecified Eustachian tube disorder, bilateral: Secondary | ICD-10-CM

## 2023-01-26 DIAGNOSIS — J301 Allergic rhinitis due to pollen: Secondary | ICD-10-CM | POA: Diagnosis not present

## 2023-01-26 DIAGNOSIS — H9202 Otalgia, left ear: Secondary | ICD-10-CM

## 2023-01-26 MED ORDER — FLUTICASONE PROPIONATE 50 MCG/ACT NA SUSP: 2.0000 | Freq: Every day | NASAL | 6 refills | Status: DC

## 2023-01-26 MED ORDER — NEOMYCIN-POLYMYXIN-HC 3.5-10000-1 OT SOLN: 3.0000 [drp] | Freq: Four times a day (QID) | OTIC | 0 refills | Status: DC

## 2023-01-26 NOTE — Progress Notes (Signed)
Subjective:  Alisha Coffey is a 47 y.o. female who presents for Chief Complaint  Patient presents with   ear pain    Left ear pain, sensitive on left side face and moved to ear and sharp pain into ear, chewing or drinking will hurt on that side. Been going on 4 days     Here for left ear and facial pain.  Started out sensitive to touch, but worsened to ear and sharp pain in ear.   Worsened with chewing or drinking x 4 days.  Today has felt some post nasal drainage on left.  Has some nasal congestion.  No cough, no sore throat, but some stuffiness in nose.   Drinks a lot of water.  Felt tender of left neck glands.     Needs refill on Flonase.  Been using tylenol.   No other aggravating or relieving factors.    No other c/o.  Past Medical History:  Diagnosis Date   Acne    Anxiety    Chronic headache 2018   improved after treatment for anxiety 2018   History of sinusitis    Hypertension    Insomnia    Joint pain    Obesity    Uterine fibroid    Current Outpatient Medications on File Prior to Visit  Medication Sig Dispense Refill   albuterol (VENTOLIN HFA) 108 (90 Base) MCG/ACT inhaler Inhale 2 puffs into the lungs every 6 (six) hours as needed for wheezing or shortness of breath. 1 each 1   ALPRAZolam (XANAX) 1 MG tablet Take 1 mg by mouth daily as needed for anxiety.     budesonide-formoterol (SYMBICORT) 160-4.5 MCG/ACT inhaler Inhale 2 puffs into the lungs 2 (two) times daily. 1 each 2   cetirizine (ZYRTEC) 10 MG tablet Take 1 tablet (10 mg total) by mouth at bedtime as needed for allergies. 90 tablet 3   Cholecalciferol (VITAMIN D) 50 MCG (2000 UT) CAPS Take 1 capsule (2,000 Units total) by mouth daily. 90 capsule 3   losartan (COZAAR) 50 MG tablet TAKE 1 TABLET BY MOUTH EVERY DAY IN THE EVENING 90 tablet 1   Probiotic Product (UP4 PROBIOTICS WOMENS PO) Take by mouth.     rosuvastatin (CRESTOR) 20 MG tablet Take 1 tablet (20 mg total) by mouth daily. 90 tablet 3    sulfamethoxazole-trimethoprim (BACTRIM DS) 800-160 MG tablet Take 1 tablet by mouth 2 (two) times daily.     traMADol (ULTRAM) 50 MG tablet Take 1 tablet (50 mg total) by mouth every 12 (twelve) hours as needed. 10 tablet 0   tretinoin (RETIN-A) 0.025 % cream Apply topically at bedtime as needed.     triamterene-hydrochlorothiazide (MAXZIDE-25) 37.5-25 MG tablet Take 1 tablet by mouth daily. 90 tablet 1   Continuous Blood Gluc Sensor (FREESTYLE LIBRE 14 DAY SENSOR) MISC 1 each by Does not apply route every 14 (fourteen) days. 6 each 3   No current facility-administered medications on file prior to visit.     The following portions of the patient's history were reviewed and updated as appropriate: allergies, current medications, past family history, past medical history, past social history, past surgical history and problem list.  ROS Otherwise as in subjective above  Objective: BP 112/70   Pulse 96   Temp (!) 97.1 F (36.2 C)   Wt 214 lb 6.4 oz (97.3 kg)   LMP 11/12/2018 (Exact Date)   BMI 33.58 kg/m   General appearance: alert, no distress, well developed, well nourished HEENT: normocephalic,  sclerae anicteric, conjunctiva pink and moist, decreased light reflex of TMs, but otherwise, nares patent, no discharge or erythema, pharynx normal Oral cavity: MMM, no lesions Neck: supple, no lymphadenopathy, no thyromegaly, no masses    Assessment: Encounter Diagnoses  Name Primary?   Seasonal allergic rhinitis due to pollen    Dysfunction of both eustachian tubes Yes   Left ear pain      Plan: I recommend good hydration at least 80-100 ounces of water daily Continue your Flonase and allergy pill Begin ear drop to see if this helps current symptoms If not improving within a week, call back and I may need to send in oral antibiotic and or steroid prednisone by mouth.  [name] was seen today for ear pain.  Diagnoses and all orders for this visit:  Dysfunction of both  eustachian tubes  Seasonal allergic rhinitis due to pollen -     fluticasone (FLONASE) 50 MCG/ACT nasal spray; Place 2 sprays into both nostrils daily.  Left ear pain  Other orders -     neomycin-polymyxin-hydrocortisone (CORTISPORIN) OTIC solution; Place 3 drops into both ears 4 (four) times daily.   Follow up: prn

## 2023-01-26 NOTE — Patient Instructions (Signed)
Barotitis Media Barotitis media is soreness (inflammation) of the area behind the eardrum (middle ear). This occurs when the auditory tube (Eustachian tube) leading from the back of the throat to the eardrum is blocked. When it is blocked air cannot move in and out of the middle ear to equalize pressure changes. These pressure changes come from changes in altitude when:  Flying.   Driving in the mountains.   Diving.  Problems are more likely to occur with pressure changes during times when you are congested as from:  Hay fever.   Upper respiratory infection.   A cold.  Damage or hearing loss (barotrauma) caused by this may be permanent. HOME CARE INSTRUCTIONS   Use medicines as recommended by your caregiver. Over the counter medicines will help unblock the canal and can help during times of air travel.   Do not put anything into your ears to clean or unplug them. Eardrops will not be helpful.   Do not swim, dive, or fly until your caregiver says it is all right to do so. If these activities are necessary, chewing gum with frequent swallowing may help. It is also helpful to hold your nose and gently blow to pop your ears for equalizing pressure changes. This forces air into the Eustachian tube.   For little ones with problems, give your baby a bottle of water or juice during periods when pressure changes would be anticipated such as during take offs and landings associated with air travel.   Only take over-the-counter or prescription medicines for pain, discomfort, or fever as directed by your caregiver.   A decongestant may be helpful in de-congesting the middle ear and make pressure equalization easier. This can be even more effective if the drops (spray) are delivered with the head lying over the edge of a bed with the head tilted toward the ear on the affected side.   If your caregiver has given you a follow-up appointment, it is very important to keep that appointment. Not keeping  the appointment could result in a chronic or permanent injury, pain, hearing loss and disability. If there is any problem keeping the appointment, you must call back to this facility for assistance.  SEEK IMMEDIATE MEDICAL CARE IF:   You develop a severe headache, dizziness, severe ear pain, or bloody or pus-like drainage from your ears.   An oral temperature above 102 F (38.9 C) develops.   Your problems do not improve or become worse.  MAKE SURE YOU:   Understand these instructions.   Will watch your condition.   Will get help right away if you are not doing well or get worse.  Document Released: 08/12/2000 Document Revised: 04/27/2011 Document Reviewed: 03/20/2008 Exit[employer] Patient Information 2012 [employer]. 

## 2023-04-01 ENCOUNTER — Encounter (HOSPITAL_COMMUNITY): Payer: Self-pay

## 2023-04-01 ENCOUNTER — Ambulatory Visit (HOSPITAL_COMMUNITY)
Admission: EM | Admit: 2023-04-01 | Discharge: 2023-04-01 | Disposition: A | Discharge status: Home / Self Care | Payer: Medicaid Other | Attending: Internal Medicine | Admitting: Internal Medicine

## 2023-04-01 DIAGNOSIS — M654 Radial styloid tenosynovitis [de Quervain]: Secondary | ICD-10-CM | POA: Diagnosis not present

## 2023-04-01 MED ORDER — PREDNISONE 20 MG PO TABS: 40.0000 mg | ORAL_TABLET | Freq: Every day | ORAL | 0 refills | Status: AC

## 2023-04-01 NOTE — ED Provider Notes (Signed)
[facility]    CSN: [id number] Arrival date & time: 04/01/23  1324      History   Chief Complaint Chief Complaint  Patient presents with   Hand Pain    HPI Alisha Coffey is a 47 y.o. female.   Patient presents to urgent care for evaluation of left hand pain at the base of the left thumb that initially started 10 days ago on March 21, 2023.  Pain is worse with movement, improving, carrying objects, and making grip motions.  She states that she is a single parent and uses her hands frequently.  She also spends a lot of time typing at the computer and has a history of bilateral carpal tunnel disease.  No recent trauma or injuries to the left hand/left thumb.  No numbness or tingling distally to pain, no swelling to the hand.  She has been treating this with Tylenol at home. History of HTN and has been advised to avoid taking NSAIDs by PCP.  No recent antibiotic or steroid use.  She has attempted use of her wrist brace use for her carpal tunnel but this does not cover her thumb and has not been very helpful with symptoms.   Hand Pain The problem occurs every several days.    Past Medical History:  Diagnosis Date   Acne    Anxiety    Chronic headache 2018   improved after treatment for anxiety 2018   History of sinusitis    Hypertension    Insomnia    Joint pain    Obesity    Uterine fibroid     Patient Active Problem List   Diagnosis Date Noted   Carpal tunnel syndrome, left upper limb 09/19/2022   Carpal tunnel syndrome, right upper limb 09/19/2022   Recurrent boils 03/24/2022   Benign hypertension 03/24/2022   Extrinsic asthma 10/18/2021   Vitamin D deficiency 10/14/2021   Leucocytosis 02/18/2021   Hyperlipidemia 02/18/2021   Primary hypertension 02/18/2021   History of uterine fibroid 12/23/2020   Allergic rhinitis due to pollen 12/23/2020   Impaired fasting blood sugar 12/23/2020   Encounter for hepatitis C screening test for low risk patient 12/23/2020    Folliculitis 12/23/2020   History of hysterectomy 08/2020   Iron deficiency anemia 09/30/2019   Anxiety 08/20/2019   Mild intermittent asthma 11/22/2018   Polyarthralgia 04/20/2018   Encounter for health maintenance examination in adult 11/24/2017   Vaccine counseling 11/24/2017   Routine general medical examination at a health care facility 10/04/2011   Chronic headaches 10/04/2011    Past Surgical History:  Procedure Laterality Date   ABDOMINAL HYSTERECTOMY N/A 09/07/2020   Procedure: HYSTERECTOMY ABDOMINAL;  Surgeon: Essie Hart, MD;  Location: [facility];  Service: Gynecology;  Laterality: N/A;   BILATERAL SALPINGECTOMY Bilateral 09/07/2020   Procedure: OPEN BILATERAL SALPINGECTOMY;  Surgeon: Essie Hart, MD;  Location: [facility];  Service: Gynecology;  Laterality: Bilateral;   BREAST REDUCTION SURGERY  2009, 2000   HERNIA REPAIR     REDUCTION MAMMAPLASTY Bilateral 2000   REDUCTION MAMMAPLASTY Bilateral 12/2007   WISDOM TOOTH EXTRACTION      OB History   No obstetric history on file.      Home Medications    Prior to Admission medications   Medication Sig Start Date End Date Taking? Authorizing Provider  ALPRAZolam Prudy Feeler) 1 MG tablet Take 1 mg by mouth daily as needed for anxiety.   Yes [provider]  cetirizine (ZYRTEC)  10 MG tablet Take 1 tablet (10 mg total) by mouth at bedtime as needed for allergies. 05/20/22  Yes Tysinger, Kermit Balo, PA-C  losartan (COZAAR) 50 MG tablet TAKE 1 TABLET BY MOUTH EVERY DAY IN THE EVENING 08/05/22  Yes Tolia, Sunit, DO  predniSONE (DELTASONE) 20 MG tablet Take 2 tablets (40 mg total) by mouth daily for 5 days. 04/01/23 04/06/23 Yes StanhopeDonavan Burnet, FNP  Probiotic Product (UP4 PROBIOTICS WOMENS PO) Take by mouth.   Yes [provider]  rosuvastatin (CRESTOR) 20 MG tablet Take 1 tablet (20 mg total) by mouth daily. 05/20/22 05/20/23 Yes Tysinger, Kermit Balo, PA-C  tretinoin (RETIN-A) 0.025  % cream Apply topically at bedtime as needed. 08/24/22  Yes [provider]  triamterene-hydrochlorothiazide (MAXZIDE-25) 37.5-25 MG tablet Take 1 tablet by mouth daily. 08/05/22 04/01/23 Yes Tolia, Sunit, DO  albuterol (VENTOLIN HFA) 108 (90 Base) MCG/ACT inhaler Inhale 2 puffs into the lungs every 6 (six) hours as needed for wheezing or shortness of breath. 05/20/22   Tysinger, Kermit Balo, PA-C  budesonide-formoterol (SYMBICORT) 160-4.5 MCG/ACT inhaler Inhale 2 puffs into the lungs 2 (two) times daily. 10/18/22   Tysinger, Kermit Balo, PA-C  Cholecalciferol (VITAMIN D) 50 MCG (2000 UT) CAPS Take 1 capsule (2,000 Units total) by mouth daily. 05/20/22   Tysinger, Kermit Balo, PA-C  Continuous Blood Gluc Sensor (FREESTYLE LIBRE 14 DAY SENSOR) MISC 1 each by Does not apply route every 14 (fourteen) days. 10/18/21   Tysinger, Kermit Balo, PA-C  fluticasone (FLONASE) 50 MCG/ACT nasal spray Place 2 sprays into both nostrils daily. 01/26/23   Tysinger, Kermit Balo, PA-C  neomycin-polymyxin-hydrocortisone (CORTISPORIN) OTIC solution Place 3 drops into both ears 4 (four) times daily. 01/26/23   Tysinger, Kermit Balo, PA-C  sulfamethoxazole-trimethoprim (BACTRIM DS) 800-160 MG tablet Take 1 tablet by mouth 2 (two) times daily. 01/24/23   [provider]  traMADol (ULTRAM) 50 MG tablet Take 1 tablet (50 mg total) by mouth every 12 (twelve) hours as needed. 09/29/22   Persons, [place], PA    Family History Family History  Problem Relation Age of Onset   Hypertension Mother    Kidney Stones Mother    Stroke Maternal Aunt    Depression Maternal Grandmother    Hypertension Maternal Grandmother    Diabetes Maternal Grandfather    Hypertension Maternal Grandfather    Cancer Neg Hx    Heart disease Neg Hx     Social History Social History   Tobacco Use   Smoking status: Never   Smokeless tobacco: Never  Vaping Use   Vaping status: Never Used  Substance Use Topics   Alcohol use: Yes    Alcohol/week: 1.0 standard  drink of alcohol    Types: 1 Glasses of wine per week    Comment: occasional   Drug use: Not Currently    Types: Marijuana    Comment: OCC     Allergies   Diazepam, Propoxyphene, and Suvorexant   Review of Systems Review of Systems Per HPI  Physical Exam Triage Vital Signs ED Triage Vitals  Encounter Vitals Group     BP 04/01/23 1506 134/89     Systolic BP Percentile --      Diastolic BP Percentile --      Pulse Rate 04/01/23 1506 87     Resp 04/01/23 1506 16     Temp 04/01/23 1506 99.1 F (37.3 C)     Temp Source 04/01/23 1506 Oral     SpO2 04/01/23 1506  98 %     Weight 04/01/23 1506 210 lb (95.3 kg)     Height 04/01/23 1506 5\' 6"  (1.676 m)     Head Circumference --      Peak Flow --      Pain Score 04/01/23 1502 10     Pain Loc --      Pain Education --      Exclude from Growth Chart --    No data found.  Updated Vital Signs BP 134/89 (BP Location: Left Arm)   Pulse 87   Temp 99.1 F (37.3 C) (Oral)   Resp 16   Ht 5\' 6"  (1.676 m)   Wt 210 lb (95.3 kg)   LMP 11/12/2018 (Exact Date)   SpO2 98%   BMI 33.89 kg/m   Visual Acuity Right Eye Distance:   Left Eye Distance:   Bilateral Distance:    Right Eye Near:   Left Eye Near:    Bilateral Near:     Physical Exam Vitals and nursing note reviewed.  Constitutional:      Appearance: She is not ill-appearing or toxic-appearing.  HENT:     Head: Normocephalic and atraumatic.     Right Ear: Hearing and external ear normal.     Left Ear: Hearing and external ear normal.     Nose: Nose normal.     Mouth/Throat:     Lips: Pink.  Eyes:     General: Lids are normal. Vision grossly intact. Gaze aligned appropriately.     Extraocular Movements: Extraocular movements intact.     Conjunctiva/sclera: Conjunctivae normal.  Pulmonary:     Effort: Pulmonary effort is normal.  Musculoskeletal:     Right hand: Normal.     Left hand: Tenderness (Positive Finklestein's test to the left thumb/wrist) present. No  swelling, deformity, lacerations or bony tenderness. Normal range of motion. Normal strength. Normal sensation. There is no disruption of two-point discrimination. Normal capillary refill. Normal pulse.     Cervical back: Neck supple.     Comments: +2 left radial pulse present, less than 2 capillary refill, 5/5 grip strength bilateral lower extremities, sensation intact distally, full ROM of the left hand.  Skin:    General: Skin is warm and dry.     Capillary Refill: Capillary refill takes less than 2 seconds.     Findings: No rash.  Neurological:     General: No focal deficit present.     Mental Status: She is alert and oriented to person, place, and time. Mental status is at baseline.     Cranial Nerves: No dysarthria or facial asymmetry.  Psychiatric:        Mood and Affect: Mood normal.        Speech: Speech normal.        Behavior: Behavior normal.        Thought Content: Thought content normal.        Judgment: Judgment normal.      UC Treatments / Results  Labs (all labs ordered are listed, but only abnormal results are displayed) Labs Reviewed - No data to display  EKG   Radiology No results found.  Procedures Procedures (including critical care time)  Medications Ordered in UC Medications - No data to display  Initial Impression / Assessment and Plan / UC Course  I have reviewed the triage vital signs and the nursing notes.  Pertinent labs & imaging results that were available during my care of the patient were reviewed by  me and considered in my medical decision making (see chart for details).   1.  De Quervain's tenosynovitis, left Finkelstein test is positive to left thumb indicating de Quervain's tenosynovitis etiology.  Neurovascularly intact distally to pain.  Will treat this with Tylenol as needed as well as prednisone burst for the next 5 days.  Advised to take this with food to avoid stomach upset.  No NSAIDs while taking prednisone.  Left distal thumb  spica brace applied, may wear this to provide stability and prevent worsening symptoms.  Recommend follow-up with orthopedics as needed, walking referral given to [facility] orthopedics.RICE recommended.  Counseled patient on potential for adverse effects with medications prescribed/recommended today, strict ER and return-to-clinic precautions discussed, patient verbalized understanding.   Final Clinical Impressions(s) / UC Diagnoses   Final diagnoses:  De Quervain's tenosynovitis, left     Discharge Instructions      You have inflammation of the tendons in your left hand.  I have prescribed prednisone 40 mg once daily for 5 days to treat inflammation.  Wear the thumb spica wrist brace to keep thumb immobilized and wrist straight to prevent worsening symptoms.  Please schedule a follow-up appointment with [facility].  Their phone number is listed on your paperwork.   If you develop any new or worsening symptoms or if your symptoms do not start to improve, please return here or follow-up with your primary care provider. If your symptoms are severe, please go to the emergency room.     ED Prescriptions     Medication Sig Dispense Auth. Provider   predniSONE (DELTASONE) 20 MG tablet Take 2 tablets (40 mg total) by mouth daily for 5 days. 10 tablet Carlisle Beers, FNP      PDMP not reviewed this encounter.   Carlisle Beers, [place] 04/01/23 1616

## 2023-04-01 NOTE — ED Triage Notes (Addendum)
left hand pain onset 03/21/23. No falls or known injuries. Patient types a lot and the base of the left hand into the palm is sore. Patient was also driving for 6 hours in one day recently when the hand started hurting 2 days after that.   Tried tylenol and muscle pain rub with slight relief. Patient tried her carpel tunnel brace all week which helped but hurts as soon as she take it off.

## 2023-04-01 NOTE — Discharge Instructions (Addendum)
You have inflammation of the tendons in your left hand.  I have prescribed prednisone 40 mg once daily for 5 days to treat inflammation.  Wear the thumb spica wrist brace to keep thumb immobilized and wrist straight to prevent worsening symptoms.  Please schedule a follow-up appointment with [facility].  Their phone number is listed on your paperwork.   If you develop any new or worsening symptoms or if your symptoms do not start to improve, please return here or follow-up with your primary care provider. If your symptoms are severe, please go to the emergency room.

## 2023-05-06 ENCOUNTER — Other Ambulatory Visit: Payer: Self-pay | Admitting: Medical

## 2023-05-06 DIAGNOSIS — J4520 Mild intermittent asthma, uncomplicated: Secondary | ICD-10-CM

## 2023-05-23 ENCOUNTER — Other Ambulatory Visit: Payer: Self-pay | Admitting: Medical

## 2023-05-24 ENCOUNTER — Telehealth: Payer: Self-pay

## 2023-05-24 ENCOUNTER — Ambulatory Visit: Payer: Medicaid Other | Admitting: Medical

## 2023-05-24 VITALS — BP 110/64 | HR 88 | Ht 67.0 in | Wt 213.0 lb

## 2023-05-24 DIAGNOSIS — E785 Hyperlipidemia, unspecified: Secondary | ICD-10-CM

## 2023-05-24 DIAGNOSIS — G5601 Carpal tunnel syndrome, right upper limb: Secondary | ICD-10-CM

## 2023-05-24 DIAGNOSIS — I10 Essential (primary) hypertension: Secondary | ICD-10-CM | POA: Diagnosis not present

## 2023-05-24 DIAGNOSIS — J4541 Moderate persistent asthma with (acute) exacerbation: Secondary | ICD-10-CM | POA: Diagnosis not present

## 2023-05-24 DIAGNOSIS — J4520 Mild intermittent asthma, uncomplicated: Secondary | ICD-10-CM

## 2023-05-24 DIAGNOSIS — Z23 Encounter for immunization: Secondary | ICD-10-CM | POA: Diagnosis not present

## 2023-05-24 DIAGNOSIS — E559 Vitamin D deficiency, unspecified: Secondary | ICD-10-CM

## 2023-05-24 DIAGNOSIS — Z0000 Encounter for general adult medical examination without abnormal findings: Secondary | ICD-10-CM

## 2023-05-24 DIAGNOSIS — L732 Hidradenitis suppurativa: Secondary | ICD-10-CM

## 2023-05-24 DIAGNOSIS — Z90710 Acquired absence of both cervix and uterus: Secondary | ICD-10-CM

## 2023-05-24 DIAGNOSIS — G5602 Carpal tunnel syndrome, left upper limb: Secondary | ICD-10-CM

## 2023-05-24 DIAGNOSIS — F419 Anxiety disorder, unspecified: Secondary | ICD-10-CM

## 2023-05-24 DIAGNOSIS — Z7185 Encounter for immunization safety counseling: Secondary | ICD-10-CM

## 2023-05-24 DIAGNOSIS — J301 Allergic rhinitis due to pollen: Secondary | ICD-10-CM

## 2023-05-24 DIAGNOSIS — Z113 Encounter for screening for infections with a predominantly sexual mode of transmission: Secondary | ICD-10-CM

## 2023-05-24 DIAGNOSIS — Z1211 Encounter for screening for malignant neoplasm of colon: Secondary | ICD-10-CM | POA: Diagnosis not present

## 2023-05-24 DIAGNOSIS — Z1231 Encounter for screening mammogram for malignant neoplasm of breast: Secondary | ICD-10-CM | POA: Insufficient documentation

## 2023-05-24 DIAGNOSIS — R7301 Impaired fasting glucose: Secondary | ICD-10-CM

## 2023-05-24 LAB — POCT URINALYSIS DIP (PROADVANTAGE DEVICE)
Bilirubin, UA: NEGATIVE
Blood, UA: NEGATIVE
Glucose, UA: NEGATIVE mg/dL
Ketones, POC UA: NEGATIVE mg/dL
Leukocytes, UA: NEGATIVE
Nitrite, UA: NEGATIVE
Protein Ur, POC: NEGATIVE mg/dL
Specific Gravity, Urine: 1.01
Urobilinogen, Ur: NEGATIVE
pH, UA: 7.5 (ref 5.0–8.0)

## 2023-05-24 MED ORDER — ALBUTEROL SULFATE HFA 108 (90 BASE) MCG/ACT IN AERS: 2.0000 | INHALATION_SPRAY | Freq: Four times a day (QID) | RESPIRATORY_TRACT | 1 refills | Status: AC | PRN

## 2023-05-24 MED ORDER — WEGOVY 0.5 MG/0.5ML ~~LOC~~ SOAJ: 0.5000 mg | SUBCUTANEOUS | 0 refills | Status: DC

## 2023-05-24 MED ORDER — SULFAMETHOXAZOLE-TRIMETHOPRIM 800-160 MG PO TABS
1.0000 | ORAL_TABLET | Freq: Two times a day (BID) | ORAL | 1 refills | Status: DC
Start: 2023-05-24 — End: 2023-08-16

## 2023-05-24 MED ORDER — FLUTICASONE PROPIONATE 50 MCG/ACT NA SUSP: 2.0000 | Freq: Every day | NASAL | 6 refills | Status: DC

## 2023-05-24 MED ORDER — LOSARTAN POTASSIUM 50 MG PO TABS: ORAL_TABLET | ORAL | 1 refills | Status: DC

## 2023-05-24 MED ORDER — CETIRIZINE HCL 10 MG PO TABS: 10.0000 mg | ORAL_TABLET | Freq: Every evening | ORAL | 3 refills | Status: DC | PRN

## 2023-05-24 MED ORDER — ALPRAZOLAM 1 MG PO TABS: 1.0000 mg | ORAL_TABLET | Freq: Every day | ORAL | 1 refills | Status: DC | PRN

## 2023-05-24 MED ORDER — WEGOVY 0.25 MG/0.5ML ~~LOC~~ SOAJ: 0.2500 mg | SUBCUTANEOUS | 0 refills | Status: DC

## 2023-05-24 MED ORDER — ROSUVASTATIN CALCIUM 20 MG PO TABS: 20.0000 mg | ORAL_TABLET | Freq: Every day | ORAL | 3 refills | Status: DC

## 2023-05-24 MED ORDER — TRIAMTERENE-HCTZ 37.5-25 MG PO TABS: 1.0000 | ORAL_TABLET | Freq: Every day | ORAL | 1 refills | Status: DC

## 2023-05-24 NOTE — Telephone Encounter (Signed)
KeyRicharda Blade Rx #: [id number] Drug: Wegovy 0.25MG /0.5ML auto-injectors Form: [facility] Electronic [place] Form [id number] NCPDP) Determination: Favorable  Your prior authorization for Reginal Lutes has been approved!  Message from plan:  PA Case: [id number],  Status: Approved,  Coverage Starts on: 05/24/2023 12:00:00 AM,  Coverage Ends on: 11/20/2023 12:00:00 AM..  Authorization Expiration Date: November 20, 2023.

## 2023-05-24 NOTE — Progress Notes (Signed)
Subjective:   HPI  Alisha Coffey is a 47 y.o. female who presents for Chief Complaint  Patient presents with   Annual Exam    Fasting cpe, would like to referral to carpel tunnel- hand specialist, would like pelvic exam- had hysterectomy, would like xanax refilled until she get insurance through her work to then see a Therapist, sports. Would like flu and covid today    Patient Care Team: Ashaki Frosch, Cleda Mccreedy as PCP - General (Family Medicine) Prior gyn with Dr. Essie Hart and Henreitta Leber PA  Dr. Tessa Lerner, cardiology Dr. Milagros Evener, psychiatrist prior [facility] [place] [facility] Prior dermatology Prior ortho with Dr. Tyrell Antonio   Concerns: Hidradenitis-blood send dermatology but insurance no longer excepted there.  She was on several months of daily Bactrim antibiotic and topical fluocinolone steroid and was seeing marginal improvement  Hypertension-compliant with medication  Hyperlipidemia-compliant medication, no side effects noted  Asthma-no recent concern  Status post hysterectomy due to prior fibroids  She was seeing psychiatry.  Currently had a change in insurance.  Would like a refill on Xanax until she can see psychiatry again  Past Medical History:  Diagnosis Date   Acne    Anxiety    Chronic headache 2018   improved after treatment for anxiety 2018   History of sinusitis    Hypertension    Insomnia    Joint pain    Obesity    Uterine fibroid     Family History  Problem Relation Age of Onset   Hypertension Mother    Kidney Stones Mother    Stroke Maternal Aunt    Depression Maternal Grandmother    Hypertension Maternal Grandmother    Diabetes Maternal Grandfather    Hypertension Maternal Grandfather    Cancer Neg Hx    Heart disease Neg Hx      Current Outpatient Medications:    mupirocin ointment (BACTROBAN) 2 %, APPLY TOPICALLY 3 TIMES A DAY, Disp: 22 g, Rfl: 1   Semaglutide-Weight Management (WEGOVY) 0.25 MG/0.5ML  SOAJ, Inject 0.25 mg into the skin once a week., Disp: 2 mL, Rfl: 0   Semaglutide-Weight Management (WEGOVY) 0.5 MG/0.5ML SOAJ, Inject 0.5 mg into the skin once a week., Disp: 2 mL, Rfl: 0   sulfamethoxazole-trimethoprim (BACTRIM DS) 800-160 MG tablet, Take 1 tablet by mouth 2 (two) times daily., Disp: 14 tablet, Rfl: 1   albuterol (VENTOLIN HFA) 108 (90 Base) MCG/ACT inhaler, Inhale 2 puffs into the lungs every 6 (six) hours as needed for wheezing or shortness of breath., Disp: 18 each, Rfl: 1   ALPRAZolam (XANAX) 1 MG tablet, Take 1 tablet (1 mg total) by mouth daily as needed for anxiety., Disp: 20 tablet, Rfl: 1   cetirizine (ZYRTEC) 10 MG tablet, Take 1 tablet (10 mg total) by mouth at bedtime as needed for allergies., Disp: 90 tablet, Rfl: 3   Continuous Blood Gluc Sensor (FREESTYLE LIBRE 14 DAY SENSOR) MISC, 1 each by Does not apply route every 14 (fourteen) days. (Patient not taking: Reported on 05/24/2023), Disp: 6 each, Rfl: 3   fluticasone (FLONASE) 50 MCG/ACT nasal spray, Place 2 sprays into both nostrils daily., Disp: 16 g, Rfl: 6   losartan (COZAAR) 50 MG tablet, TAKE 1 TABLET BY MOUTH EVERY DAY IN THE EVENING, Disp: 90 tablet, Rfl: 1   rosuvastatin (CRESTOR) 20 MG tablet, Take 1 tablet (20 mg total) by mouth daily., Disp: 90 tablet, Rfl: 3   tretinoin (RETIN-A) 0.025 % cream,  Apply topically at bedtime as needed., Disp: , Rfl:    triamterene-hydrochlorothiazide (MAXZIDE-25) 37.5-25 MG tablet, Take 1 tablet by mouth daily., Disp: 90 tablet, Rfl: 1  Allergies  Allergen Reactions   Diazepam Nausea And Vomiting and Other (See Comments)   Propoxyphene Itching   Suvorexant Other (See Comments)    Didn't tolerate    Reviewed their medical, surgical, family, social, medication, and allergy history and updated chart as appropriate.  Review of Systems  Constitutional:  Negative for chills, fever, malaise/fatigue and weight loss.  HENT:  Negative for congestion, ear pain, hearing loss,  sore throat and tinnitus.   Eyes:  Negative for blurred vision, pain and redness.  Respiratory:  Negative for cough, hemoptysis and shortness of breath.   Cardiovascular:  Negative for chest pain, palpitations, orthopnea, claudication and leg swelling.  Gastrointestinal:  Negative for abdominal pain, blood in stool, constipation, diarrhea, nausea and vomiting.  Genitourinary:  Negative for dysuria, flank pain, frequency, hematuria and urgency.  Musculoskeletal:  Positive for myalgias. Negative for falls and joint pain.  Skin:  Negative for itching and rash.       HS  Neurological:  Positive for sensory change. Negative for dizziness, tingling, speech change, weakness and headaches.  Endo/Heme/Allergies:  Negative for polydipsia. Does not bruise/bleed easily.  Psychiatric/Behavioral:  Negative for depression and memory loss. The patient is nervous/anxious. The patient does not have insomnia.      Objective:  BP 110/64   Pulse 88   Ht 5\' 7"  (1.702 m)   Wt 213 lb (96.6 kg)   LMP 11/12/2018 (Exact Date)   BMI 33.36 kg/m    Wt Readings from Last 3 Encounters:  05/24/23 213 lb (96.6 kg)  04/01/23 210 lb (95.3 kg)  01/26/23 214 lb 6.4 oz (97.3 kg)   BP Readings from Last 3 Encounters:  05/24/23 110/64  04/01/23 134/89  01/26/23 112/70    General appearance: alert, no distress, WD/WN, African American female Skin: Right and left mons pubis area with tender tissue that appears a little pinkish but no current drainage, no induration or fluctuance.  Otherwise no other skin lesions of concern. HEENT: normocephalic, conjunctiva/corneas normal, sclerae anicteric, PERRLA, EOMi, nares patent, no discharge or erythema, pharynx normal Neck: supple, no lymphadenopathy, no thyromegaly, no masses, normal ROM, no bruits Chest: non tender, normal shape and expansion Heart: RRR, normal S1, S2, no murmurs Lungs: CTA bilaterally, no wheezes, rhonchi, or rales Abdomen: +bs, soft, non tender, non  distended, no masses, no hepatomegaly, no splenomegaly, no bruits Back: non tender, normal ROM, no scoliosis Musculoskeletal: upper extremities non tender, no obvious deformity, normal ROM throughout, lower extremities non tender, no obvious deformity, normal ROM throughout Extremities: no edema, no cyanosis, no clubbing Pulses: 2+ symmetric, upper and lower extremities, normal cap refill Neurological: alert, oriented x 3, CN2-12 intact, strength normal upper extremities and lower extremities, sensation normal throughout, DTRs 2+ throughout, no cerebellar signs, gait normal Psychiatric: normal affect, behavior normal, pleasant   Breast: nontender, status post prior breast reduction surgery scars, no masses or lumps, no skin changes, no nipple discharge or inversion, no axillary lymphadenopathy Gyn: Normal external genitalia without lesions, vagina with normal mucosa, no abnormal vaginal discharge. adnexa not enlarged, nontender, no masses.  Exam chaperoned by nurse. Rectal:deferred    Assessment and Plan :   Encounter Diagnoses  Name Primary?   Encounter for health maintenance examination in adult Yes   Needs flu shot    COVID-19 vaccine administered  Screening for colon cancer    Encounter for screening mammogram for malignant neoplasm of breast    Benign hypertension    Anxiety    Moderate persistent extrinsic asthma with acute exacerbation    Hyperlipidemia, unspecified hyperlipidemia type    Impaired fasting blood sugar    Primary hypertension    Vitamin D deficiency    Vaccine counseling    Hidradenitis suppurativa    Screen for STD (sexually transmitted disease)    S/P hysterectomy    Carpal tunnel syndrome, left upper limb    Carpal tunnel syndrome, right upper limb    Mild intermittent asthma without complication    Seasonal allergic rhinitis due to pollen     Today you had a preventative care visit or wellness visit.    Topics today may have included healthy  lifestyle, diet, exercise, preventative care, vaccinations, sick and well care, proper use of emergency dept and after hours care, as well as other concerns.     Recommendations: Continue to return yearly for your annual wellness and preventative care visits.  This gives [place] a chance to discuss healthy lifestyle, exercise, vaccinations, review your chart record, and perform screenings where appropriate.  I recommend you see your eye doctor yearly for routine vision care.  I recommend you see your dentist yearly for routine dental care including hygiene visits twice yearly.   Vaccination recommendations were reviewed  Counseled on the influenza virus vaccine.  Vaccine information sheet given.  Influenza vaccine given after consent obtained.  Counseled on the Covid virus vaccine.  Vaccine information sheet given.  Covid vaccine given after consent obtained.    Screening for cancer: Breast cancer screening: You should perform a self breast exam monthly.    Please call to schedule your mammogram.   [facility]  [phone] [place], [place] [address]   Colon cancer screening:  Referral to gastro for colonoscopy   Cervical cancer screening: S/p hysterectomy   Skin cancer screening: Check your skin regularly for new changes, growing lesions, or other lesions of concern Come in for evaluation if you have skin lesions of concern.  Lung cancer screening: If you have a greater than 30 pack year history of tobacco use, then you qualify for lung cancer screening with a chest CT scan  We currently don't have screenings for other cancers besides breast, cervical, colon, and lung cancers.  If you have a strong family history of cancer or have other cancer screening concerns, please let me know.    Bone health: Get at least 150 minutes of aerobic exercise weekly Get weight bearing exercise at least once weekly   Heart  health: Get at least 150 minutes of aerobic exercise weekly Limit alcohol It is important to maintain a healthy blood pressure and healthy cholesterol numbers    Separate significant issues discussed: History of carpal tunnel issues/paresthesias of arms-prior consult with orthopedics and physical therapy.  Referral to hand surgery center for second opinion.  NCV with EMG 09/28/2022 results: Impression: The above electrodiagnostic study is ABNORMAL and reveals evidence of a moderate to severe bilateral median nerve entrapment at the wrist (carpal tunnel syndrome) affecting sensory and motor components.    There is no significant electrodiagnostic evidence of any other focal nerve entrapment, brachial plexopathy or cervical radiculopathy.  Hypertension-continue current medication Maxide  History of anemia-updated labs today, not currently on iron  Status post hysterectomy due to fibroids  Anxiety-refilled medication for prn  use  Hyperlipidemia-continue statin, Rosuvastatin 20 mg daily, labs today  Asthma-continue albuterol as needed  Hidradenitis, cystic lesions of the skin in the mons pubis-in recent months was seeing dermatology and was prescribed daily Bactrim antibiotic and topical fluocinolone steroid topically.  Currently has 2 flareups and hasnt been on Bactrim and topical fluocinolone recently.  Restart short term bactrim, begin medication to help with weight loss in hopes that this may help with HS as well.  Vitamin D deficiency-updated labs today  BMI greater than 33-discussed exercise, diet, and if insurance will cover, begin trial back on GLP-1 or similar medication.   Alisha Coffey was seen today for annual exam.  Diagnoses and all orders for this visit:  Encounter for health maintenance examination in adult -     MM 3D SCREENING MAMMOGRAM BILATERAL BREAST -     Ambulatory referral to Gastroenterology -     Comprehensive metabolic panel -     CBC -     Lipid panel -      Hemoglobin A1c -     TSH -     VITAMIN D 25 Hydroxy (Vit-D Deficiency, Fractures) -     Chlamydia/Gonococcus/Trichomonas, NAA -     HIV Antibody (routine testing w rflx) -     RPR -     Hepatitis C antibody -     Hepatitis B surface antigen  Needs flu shot -     Flu vaccine trivalent PF, 6mos and older(Flulaval,Afluria,Fluarix,Fluzone)  COVID-19 vaccine administered -     Pfizer Comirnaty Covid -19 Vaccine 67yrs and older  Screening for colon cancer -     Ambulatory referral to Gastroenterology  Encounter for screening mammogram for malignant neoplasm of breast -     MM 3D SCREENING MAMMOGRAM BILATERAL BREAST  Benign hypertension -     triamterene-hydrochlorothiazide (MAXZIDE-25) 37.5-25 MG tablet; Take 1 tablet by mouth daily. -     losartan (COZAAR) 50 MG tablet; TAKE 1 TABLET BY MOUTH EVERY DAY IN THE EVENING  Anxiety  Moderate persistent extrinsic asthma with acute exacerbation  Hyperlipidemia, unspecified hyperlipidemia type -     Lipid panel  Impaired fasting blood sugar -     Hemoglobin A1c  Primary hypertension  Vitamin D deficiency -     VITAMIN D 25 Hydroxy (Vit-D Deficiency, Fractures)  Vaccine counseling  Hidradenitis suppurativa  Screen for STD (sexually transmitted disease) -     Chlamydia/Gonococcus/Trichomonas, NAA -     HIV Antibody (routine testing w rflx) -     RPR -     Hepatitis C antibody -     Hepatitis B surface antigen  S/P hysterectomy  Carpal tunnel syndrome, left upper limb -     Ambulatory referral to Hand Surgery  Carpal tunnel syndrome, right upper limb -     Ambulatory referral to Hand Surgery  Mild intermittent asthma without complication -     albuterol (VENTOLIN HFA) 108 (90 Base) MCG/ACT inhaler; Inhale 2 puffs into the lungs every 6 (six) hours as needed for wheezing or shortness of breath.  Seasonal allergic rhinitis due to pollen -     fluticasone (FLONASE) 50 MCG/ACT nasal spray; Place 2 sprays into both nostrils  daily. -     cetirizine (ZYRTEC) 10 MG tablet; Take 1 tablet (10 mg total) by mouth at bedtime as needed for allergies.  Other orders -     Semaglutide-Weight Management (WEGOVY) 0.25 MG/0.5ML SOAJ; Inject 0.25 mg into the skin once a week. -  Semaglutide-Weight Management (WEGOVY) 0.5 MG/0.5ML SOAJ; Inject 0.5 mg into the skin once a week. -     rosuvastatin (CRESTOR) 20 MG tablet; Take 1 tablet (20 mg total) by mouth daily. -     ALPRAZolam (XANAX) 1 MG tablet; Take 1 tablet (1 mg total) by mouth daily as needed for anxiety. -     sulfamethoxazole-trimethoprim (BACTRIM DS) 800-160 MG tablet; Take 1 tablet by mouth 2 (two) times daily.   Follow-up pending labs, yearly for physical

## 2023-05-24 NOTE — Telephone Encounter (Signed)
KeyAlthea Charon Rx #: [id number] Drug: YQMVHQ 0.25MG /0.5ML auto-injectors Form: [facility] Electronic [place] Form [phone] NCPDP) Determination: Wait for Determination

## 2023-05-24 NOTE — Addendum Note (Signed)
Addended by: Herminio Commons A on: 05/24/2023 10:15 AM   Modules accepted: Orders

## 2023-05-24 NOTE — Telephone Encounter (Signed)
Pt notified via phone. Approval faxed to pharmacy.

## 2023-05-25 ENCOUNTER — Other Ambulatory Visit: Payer: Self-pay | Admitting: Medical

## 2023-05-25 LAB — COMPREHENSIVE METABOLIC PANEL
ALT: 21 IU/L (ref 0–32)
AST: 20 IU/L (ref 0–40)
Albumin: 4.3 g/dL (ref 3.9–4.9)
Alkaline Phosphatase: 64 IU/L (ref 44–121)
BUN/Creatinine Ratio: 17 (ref 9–23)
BUN: 15 mg/dL (ref 6–24)
Bilirubin Total: 0.4 mg/dL (ref 0.0–1.2)
CO2: 23 mmol/L (ref 20–29)
Calcium: 9.6 mg/dL (ref 8.7–10.2)
Chloride: 103 mmol/L (ref 96–106)
Creatinine, Ser: 0.9 mg/dL (ref 0.57–1.00)
Globulin, Total: 2.9 g/dL (ref 1.5–4.5)
Glucose: 114 mg/dL — ABNORMAL HIGH (ref 70–99)
Potassium: 4 mmol/L (ref 3.5–5.2)
Sodium: 140 mmol/L (ref 134–144)
Total Protein: 7.2 g/dL (ref 6.0–8.5)
eGFR: 80 mL/min/{1.73_m2} (ref >59)

## 2023-05-25 LAB — CBC
Hematocrit: 41.5 % (ref 34.0–46.6)
Hemoglobin: 13.8 g/dL (ref 11.1–15.9)
MCH: 31.5 pg (ref 26.6–33.0)
MCHC: 33.3 g/dL (ref 31.5–35.7)
MCV: 95 fL (ref 79–97)
Platelets: 209 10*3/uL (ref 150–450)
RBC: 4.38 x10E6/uL (ref 3.77–5.28)
RDW: 12.7 % (ref 11.7–15.4)
WBC: 4.5 10*3/uL (ref 3.4–10.8)

## 2023-05-25 LAB — HIV ANTIBODY (ROUTINE TESTING W REFLEX): HIV Screen 4th Generation wRfx: NONREACTIVE

## 2023-05-25 LAB — VITAMIN D 25 HYDROXY (VIT D DEFICIENCY, FRACTURES): Vit D, 25-Hydroxy: 32.3 ng/mL (ref 30.0–100.0)

## 2023-05-25 LAB — LIPID PANEL
Chol/HDL Ratio: 2.8 ratio (ref 0.0–4.4)
Cholesterol, Total: 141 mg/dL (ref 100–199)
HDL: 50 mg/dL (ref >39)
LDL Chol Calc (NIH): 73 mg/dL (ref 0–99)
Triglycerides: 94 mg/dL (ref 0–149)
VLDL Cholesterol Cal: 18 mg/dL (ref 5–40)

## 2023-05-25 LAB — HEPATITIS C ANTIBODY: Hep C Virus Ab: NONREACTIVE

## 2023-05-25 LAB — HEMOGLOBIN A1C
Est. average glucose Bld gHb Est-mCnc: 120 mg/dL
Hgb A1c MFr Bld: 5.8 % — ABNORMAL HIGH (ref 4.8–5.6)

## 2023-05-25 LAB — HEPATITIS B SURFACE ANTIGEN: Hepatitis B Surface Ag: NEGATIVE

## 2023-05-25 LAB — RPR: RPR Ser Ql: NONREACTIVE

## 2023-05-25 LAB — TSH: TSH: 2.83 u[IU]/mL (ref 0.450–4.500)

## 2023-05-25 NOTE — Progress Notes (Signed)
Blood sugar elevated and hgba1c marker at risk for diabetes.  Kidney, electrolytes, and liver normal.  Blood counts normal. Cholesterol ok.  Thyroid ok.   Vitamin D ok.    Still pending gonorrhea and chlamydia tests but HIV, Hep B, Hep C, and syphilis negative.    Before you start the [facility] medicine sent to the pharmacy, I forgot to mention there is a weight loss study using a similar type of medicine.  If you would be agreeable to this study, you get paid to be a part of the study, free medication, nutrition counseling and routine follow-up.  I would rather go this direction if you are agreeable.

## 2023-05-27 LAB — CHLAMYDIA/GONOCOCCUS/TRICHOMONAS, NAA
Chlamydia by NAA: NEGATIVE
Gonococcus by NAA: NEGATIVE
Trich vag by NAA: NEGATIVE

## 2023-05-28 ENCOUNTER — Other Ambulatory Visit: Payer: Self-pay | Admitting: Medical

## 2023-05-28 NOTE — Progress Notes (Signed)
Results sent through MyChart

## 2023-05-31 ENCOUNTER — Other Ambulatory Visit: Payer: Self-pay | Admitting: Medical

## 2023-05-31 MED ORDER — ZEPBOUND 2.5 MG/0.5ML ~~LOC~~ SOAJ
2.5000 mg | SUBCUTANEOUS | 0 refills | Status: DC
Start: 2023-05-31 — End: 2023-08-16

## 2023-05-31 MED ORDER — ZEPBOUND 5 MG/0.5ML ~~LOC~~ SOAJ: 5.0000 mg | SUBCUTANEOUS | 0 refills | Status: DC

## 2023-06-02 ENCOUNTER — Telehealth: Payer: Self-pay

## 2023-06-02 NOTE — Telephone Encounter (Signed)
KeyMilon Dikes PA Case ID #: [id number] Rx #: [id number] Status: sent iconSent to Plan today Drug: Zepbound 2.5MG /0.5ML pen-injectors Form: [facility] [employer] Electronic [place] Form [phone] NCPDP)

## 2023-06-03 NOTE — Telephone Encounter (Signed)
See notes from [place], she asked me about this pt, Alisha Coffey was approved, but was unable to get due to shortage, Zepbound exception P.A. has been sent in

## 2023-06-05 ENCOUNTER — Other Ambulatory Visit: Payer: Self-pay | Admitting: Medical

## 2023-06-05 MED ORDER — WEGOVY 0.25 MG/0.5ML ~~LOC~~ SOAJ: 0.2500 mg | SUBCUTANEOUS | 0 refills | Status: DC

## 2023-06-05 MED ORDER — WEGOVY 0.5 MG/0.5ML ~~LOC~~ SOAJ: 0.5000 mg | SUBCUTANEOUS | 0 refills | Status: DC

## 2023-06-05 NOTE — Telephone Encounter (Signed)
PA for zepbound denied for the following reasons:  -pt has not had a trial and failure of Wegovy (including completion of 3 to 6  months of Wegovy to complete dose titration and determine your side effect profile)  - no documented contraindication to [facility]  Please advise

## 2023-06-05 NOTE — Telephone Encounter (Signed)
Pt able to get rx filled at [facility]

## 2023-06-05 NOTE — Telephone Encounter (Signed)
KeyMilon Dikes PA Case ID #: [id number] Rx #: [id number] Outcome: Denied on October 4 by [facility] [place] Case: [id number],  Status: Denied.  Notification: Completed. Drug: Zepbound 2.5MG /0.5ML pen-injectors Form: [facility] [employer] Electronic [place] Form [phone] NCPDP)

## 2023-06-28 ENCOUNTER — Ambulatory Visit
Admission: RE | Admit: 2023-06-28 | Discharge: 2023-06-28 | Disposition: A | Discharge status: Home / Self Care | Payer: Medicaid Other | Source: Ambulatory Visit | Attending: Medical | Admitting: Medical

## 2023-06-30 NOTE — Progress Notes (Signed)
Results sent through MyChart

## 2023-08-11 ENCOUNTER — Ambulatory Visit: Payer: Self-pay | Admitting: Cardiology

## 2023-08-15 ENCOUNTER — Other Ambulatory Visit: Payer: Self-pay | Admitting: Medical

## 2023-08-16 ENCOUNTER — Ambulatory Visit: Payer: Medicaid Other | Attending: Cardiology | Admitting: Cardiology

## 2023-08-16 ENCOUNTER — Encounter: Payer: Self-pay | Admitting: Cardiology

## 2023-08-16 VITALS — BP 120/86 | HR 81 | Resp 16 | Ht 67.0 in | Wt 208.2 lb

## 2023-08-16 DIAGNOSIS — I10 Essential (primary) hypertension: Secondary | ICD-10-CM

## 2023-08-16 DIAGNOSIS — R7303 Prediabetes: Secondary | ICD-10-CM

## 2023-08-16 DIAGNOSIS — E66811 Obesity, class 1: Secondary | ICD-10-CM

## 2023-08-16 DIAGNOSIS — E7800 Pure hypercholesterolemia, unspecified: Secondary | ICD-10-CM

## 2023-08-16 DIAGNOSIS — Z6832 Body mass index (BMI) 32.0-32.9, adult: Secondary | ICD-10-CM

## 2023-08-16 DIAGNOSIS — E6609 Other obesity due to excess calories: Secondary | ICD-10-CM

## 2023-08-16 MED ORDER — LOSARTAN POTASSIUM 50 MG PO TABS: ORAL_TABLET | ORAL | 3 refills | Status: DC

## 2023-08-16 NOTE — Patient Instructions (Signed)

## 2023-08-16 NOTE — Progress Notes (Signed)
Cardiology Office Note:  .   Date:  08/16/2023  ID:  Shelly Coss, DOB July 07, 1976, MRN [id number] PCP:  Jac Canavan, PA-C  Former Cardiology Providers: Altamese Elgin, APRN, FNP-C  [facility] Providers Cardiologist:  Tessa Lerner, DO , [facility] (established care 11/2020) Electrophysiologist:  None  Click to update primary MD,subspecialty MD or APP then REFRESH:1}    Chief Complaint  Patient presents with   Benign hypertension   Follow-up    1 year    History of Present Illness: .   Alisha Coffey is a 47 y.o. African-American female whose past medical history and cardiovascular risk factors includes: bilateral carpal tunnel surgery, anxiety, depression, chronic migraines, marijuana use, diabetes, hypertension, obesity due to excess calorie   Referred to the practice back in April 2022 for benign essential hypertension management.   Prior to establishing care her blood pressures used to be between 160-170 mmHg.  With up titration of medical therapy she has done very well.  She presents today for a 1 year follow-up visit.  Home blood pressures are well-controlled.  She recently had bilateral carpal tunnel surgery and is recovering well.  At times when she changes positions quickly  Review of Systems: .   Review of Systems  Cardiovascular:  Negative for chest pain, claudication, irregular heartbeat, leg swelling, near-syncope, orthopnea, palpitations, paroxysmal nocturnal dyspnea and syncope.  Respiratory:  Negative for shortness of breath.   Hematologic/Lymphatic: Negative for bleeding problem.    Studies Reviewed:   EKG: EKG Interpretation Date/Time:  Wednesday August 16 2023 09:10:22 EST Ventricular Rate:  86 PR Interval:  152 QRS Duration:  84 QT Interval:  368 QTC Calculation: 440 R Axis:   34  Text Interpretation: Normal sinus rhythm Nonspecific T wave abnormality Inferior leads and Anterior leads No significant change since last tracing Confirmed by  Tessa Lerner [phone]) on 08/16/2023 9:54:17 AM  Echocardiogram: 12/24/2020: Normal LV systolic function with visual EF 60-65%. Left ventricle cavity is normal in size. Normal global wall motion. Normal diastolic filling pattern, normal LAP. No significant valvular abnormalities. No prior study for comparison.  Stress Testing: Exercise treadmill stress test 02/19/2021: Exercise treadmill stress test performed using Bruce protocol.  Patient reached 10.1 METS, and 84% of age predicted maximum heart rate.  Exercise capacity was good.  No chest pain reported.  Normal heart rate and hemodynamic response. Stress EKG revealed no ischemic changes. Low risk study.  RADIOLOGY: NA  Risk Assessment/Calculations:   N/A   Labs:       Latest Ref Rng & Units 05/24/2023    9:21 AM 05/19/2022    2:37 PM 02/18/2021    8:51 AM  CBC  WBC 3.4 - 10.8 x10E3/uL 4.5  4.5  6.0   Hemoglobin 11.1 - 15.9 g/dL 19.1  47.8  29.5   Hematocrit 34.0 - 46.6 % 41.5  40.5  39.6   Platelets 150 - 450 x10E3/uL 209  219  244        Latest Ref Rng & Units 05/24/2023    9:21 AM 05/19/2022    2:37 PM 12/24/2020    9:13 AM  BMP  Glucose 70 - 99 mg/dL 621  87  308   BUN 6 - 24 mg/dL 15  13    Creatinine 6.57 - 1.00 mg/dL 8.46  9.62    BUN/Creat Ratio 9 - 23 17  15     Sodium 134 - 144 mmol/L 140  136    Potassium 3.5 - 5.2  mmol/L 4.0  4.1    Chloride 96 - 106 mmol/L 103  101    CO2 20 - 29 mmol/L 23  22    Calcium 8.7 - 10.2 mg/dL 9.6  9.5        Latest Ref Rng & Units 05/24/2023    9:21 AM 05/19/2022    2:37 PM 12/24/2020    9:13 AM  CMP  Glucose 70 - 99 mg/dL 161  87  096   BUN 6 - 24 mg/dL 15  13    Creatinine 0.45 - 1.00 mg/dL 4.09  8.11    Sodium 914 - 144 mmol/L 140  136    Potassium 3.5 - 5.2 mmol/L 4.0  4.1    Chloride 96 - 106 mmol/L 103  101    CO2 20 - 29 mmol/L 23  22    Calcium 8.7 - 10.2 mg/dL 9.6  9.5    Total Protein 6.0 - 8.5 g/dL 7.2  7.2  7.1   Total Bilirubin 0.0 - 1.2 mg/dL 0.4  0.4  0.3    Alkaline Phos 44 - 121 IU/L 64  63  72   AST 0 - 40 IU/L 20  20  16    ALT 0 - 32 IU/L 21  24  30      Lab Results  Component Value Date   CHOL 141 05/24/2023   HDL 50 05/24/2023   LDLCALC 73 05/24/2023   TRIG 94 05/24/2023   CHOLHDL 2.8 05/24/2023   No results for input(s): "LIPOA" in the last 8760 hours. No components found for: "NTPROBNP" No results for input(s): "PROBNP" in the last 8760 hours. Recent Labs    05/24/23 0921  TSH 2.830    Physical Exam:    Today's Vitals   08/16/23 0907  BP: 120/86  Pulse: 81  Resp: 16  SpO2: 98%  Weight: 208 lb 3.2 oz (94.4 kg)  Height: 5\' 7"  (1.702 m)   Body mass index is 32.61 kg/m. Wt Readings from Last 3 Encounters:  08/16/23 208 lb 3.2 oz (94.4 kg)  05/24/23 213 lb (96.6 kg)  04/01/23 210 lb (95.3 kg)    Physical Exam  Constitutional: No distress.  hemodynamically stable  Neck: No JVD present.  Cardiovascular: Normal rate, regular rhythm, S1 normal and S2 normal. Exam reveals no gallop, no S3 and no S4.  No murmur heard. Pulmonary/Chest: Effort normal and breath sounds normal. No stridor. She has no wheezes. She has no rales.  Abdominal: Soft. Bowel sounds are normal. She exhibits no distension. There is no abdominal tenderness.  Musculoskeletal:        General: No edema.     Cervical back: Neck supple.  Neurological: She is alert and oriented to person, place, and time. She has intact cranial nerves (2-12).  Skin: Skin is warm.     Impression & Recommendation(s):  Impression:   ICD-10-CM   1. Benign hypertension  I10 EKG 12-Lead    losartan (COZAAR) 50 MG tablet    2. Pure hypercholesterolemia  E78.00     3. Prediabetes  R73.03     4. Class 1 obesity due to excess calories with serious comorbidity and body mass index (BMI) of 32.0 to 32.9 in adult  E66.811    E66.09    Z68.32        Recommendation(s):  Benign hypertension Office and home blood pressures are very well-controlled on current medical  therapy. Currently on losartan 50 mg p.o. every afternoon.  Medication refilled  Currently on Maxide 37.5/25 mg p.o. every morning.  Medication refilled At times when she changes positions quickly she does get lightheaded and dizziness.  For now I have asked her to change positions slowly, keeping yourself well-hydrated, and monitoring her blood pressures at home.  If she has episodes of hypotension or frequent episodes of orthostasis that is very reasonable to reduce from [facility] to just hydrochlorothiazide in the morning.  She will let us know how she feels in the coming months. Reemphasized importance of low-salt diet.  Pure hypercholesterolemia LDL September 2023 147 mg/dL. She has been more compliant with her medical therapy. Most recent LDL 73 mg/dL Continue Crestor 20 mg p.o. nightly. Most recent labs from 05/24/2023 independently reviewed  Prediabetes Class 1 obesity due to excess calories with serious comorbidity and body mass index (BMI) of 32.0 to 32.9 in adult Body mass index is 32.61 kg/m. I reviewed with her importance of diet, regular physical activity/exercise, weight loss.   Patient is educated on the importance of increasing physical activity gradually as tolerated with a goal of moderate intensity exercise for 30 minutes a day 5 days a week.  Orders Placed:  Orders Placed This Encounter  Procedures   EKG 12-Lead   Discussed management of least 2 chronic comorbid conditions, independently reviewed EKG, labs independently reviewed from 05/24/2023, medications refilled.  Final Medication List:    Meds ordered this encounter  Medications   losartan (COZAAR) 50 MG tablet    Sig: TAKE 1 TABLET BY MOUTH EVERY DAY IN THE EVENING    Dispense:  90 tablet    Refill:  3    Medications Discontinued During This Encounter  Medication Reason   tretinoin (RETIN-A) 0.025 % cream Patient Preference   tirzepatide (ZEPBOUND) 5 MG/0.5ML Pen Change in therapy   tirzepatide (ZEPBOUND)  2.5 MG/0.5ML Pen Change in therapy   Continuous Blood Gluc Sensor (FREESTYLE LIBRE 14 DAY SENSOR) MISC Patient Preference   sulfamethoxazole-trimethoprim (BACTRIM DS) 800-160 MG tablet Patient Preference   losartan (COZAAR) 50 MG tablet Reorder     Current Outpatient Medications:    albuterol (VENTOLIN HFA) 108 (90 Base) MCG/ACT inhaler, Inhale 2 puffs into the lungs every 6 (six) hours as needed for wheezing or shortness of breath., Disp: 18 each, Rfl: 1   cetirizine (ZYRTEC) 10 MG tablet, Take 1 tablet (10 mg total) by mouth at bedtime as needed for allergies., Disp: 90 tablet, Rfl: 3   fluticasone (FLONASE) 50 MCG/ACT nasal spray, Place 2 sprays into both nostrils daily., Disp: 16 g, Rfl: 6   gabapentin (NEURONTIN) 300 MG capsule, Take 300 mg by mouth daily., Disp: , Rfl:    mupirocin ointment (BACTROBAN) 2 %, APPLY TOPICALLY 3 TIMES A DAY, Disp: 22 g, Rfl: 1   rosuvastatin (CRESTOR) 20 MG tablet, Take 1 tablet (20 mg total) by mouth daily., Disp: 90 tablet, Rfl: 3   triamterene-hydrochlorothiazide (MAXZIDE-25) 37.5-25 MG tablet, Take 1 tablet by mouth daily., Disp: 90 tablet, Rfl: 1   ALPRAZolam (XANAX) 1 MG tablet, TAKE 1 TABLET BY MOUTH DAILY AS NEEDED FOR ANXIETY (Patient not taking: Reported on 08/16/2023), Disp: 20 tablet, Rfl: 1   losartan (COZAAR) 50 MG tablet, TAKE 1 TABLET BY MOUTH EVERY DAY IN THE EVENING, Disp: 90 tablet, Rfl: 3   Semaglutide-Weight Management (WEGOVY) 0.25 MG/0.5ML SOAJ, Inject 0.25 mg into the skin once a week. (Patient not taking: Reported on 08/16/2023), Disp: 2 mL, Rfl: 0   Semaglutide-Weight Management (WEGOVY) 0.5 MG/0.5ML SOAJ, Inject 0.5 mg  into the skin once a week. (Patient not taking: Reported on 08/16/2023), Disp: 2 mL, Rfl: 0  Consent:   N/A  Disposition:   1 year sooner if needed  Patient may be asked to follow-up sooner based on the results of the above-mentioned testing.  Her questions and concerns were addressed to her satisfaction. She  voices understanding of the recommendations provided during this encounter.    Signed, Tessa Lerner, DO, [facility]  [facility]  [address] [place], [place] [address] 08/16/2023 10:06 AM

## 2023-08-24 ENCOUNTER — Ambulatory Visit: Payer: Medicaid Other | Admitting: Medical

## 2023-08-24 VITALS — BP 122/72 | HR 90 | Temp 97.1°F | Resp 16 | Wt 209.2 lb

## 2023-08-24 DIAGNOSIS — R058 Other specified cough: Secondary | ICD-10-CM

## 2023-08-24 DIAGNOSIS — L732 Hidradenitis suppurativa: Secondary | ICD-10-CM

## 2023-08-24 DIAGNOSIS — J4541 Moderate persistent asthma with (acute) exacerbation: Secondary | ICD-10-CM

## 2023-08-24 DIAGNOSIS — R7301 Impaired fasting glucose: Secondary | ICD-10-CM

## 2023-08-24 DIAGNOSIS — Z6832 Body mass index (BMI) 32.0-32.9, adult: Secondary | ICD-10-CM

## 2023-08-24 MED ORDER — DOXYCYCLINE HYCLATE 100 MG PO TABS: 100.0000 mg | ORAL_TABLET | Freq: Two times a day (BID) | ORAL | 0 refills | Status: DC

## 2023-08-24 MED ORDER — BUDESONIDE-FORMOTEROL FUMARATE 160-4.5 MCG/ACT IN AERO: 2.0000 | INHALATION_SPRAY | Freq: Two times a day (BID) | RESPIRATORY_TRACT | 5 refills | Status: AC

## 2023-08-24 MED ORDER — PREDNISONE 10 MG PO TABS: ORAL_TABLET | ORAL | 0 refills | Status: DC

## 2023-08-24 NOTE — Progress Notes (Signed)
Subjective:  Alisha Coffey is a 47 y.o. female who presents for Chief Complaint  Patient presents with   cough    Cough x 4 days. Symptoms- sneezing, runny nose, watery eye, lots of mucous- yellow/ greenish thick, having some SOB. Wheezing, can't get what's in chest out. Covid negative christmas eve   Here for respiratory tract infection and asthma.  She notes 4 days of cough, congestion, mucus, runny nose, sneezing, sometimes clear and sometimes colored mucus.  Mostly is concerned about her chest that she feels wheezy.  She does started back on her Symbicort a few days ago.  She is using albuterol several times a day and still feels tight in the chest.  No fever, no bodyaches or chills.  No blood-tinged mucus.  No nausea vomiting or diarrhea.  She did a home COVID test today that was negative  She also has questions about starting [facility].  She has 2 months of the medication at home but has not started yet due to recent surgery for carpal tunnel.  No other aggravating or relieving factors.    No other c/o.  Past Medical History:  Diagnosis Date   Acne    Anxiety    Chronic headache 2018   improved after treatment for anxiety 2018   History of sinusitis    Hypertension    Insomnia    Joint pain    Obesity    Uterine fibroid    Current Outpatient Medications on File Prior to Visit  Medication Sig Dispense Refill   albuterol (VENTOLIN HFA) 108 (90 Base) MCG/ACT inhaler Inhale 2 puffs into the lungs every 6 (six) hours as needed for wheezing or shortness of breath. 18 each 1   ALPRAZolam (XANAX) 1 MG tablet TAKE 1 TABLET BY MOUTH DAILY AS NEEDED FOR ANXIETY 20 tablet 1   cetirizine (ZYRTEC) 10 MG tablet Take 1 tablet (10 mg total) by mouth at bedtime as needed for allergies. 90 tablet 3   fluticasone (FLONASE) 50 MCG/ACT nasal spray Place 2 sprays into both nostrils daily. 16 g 6   losartan (COZAAR) 50 MG tablet TAKE 1 TABLET BY MOUTH EVERY DAY IN THE EVENING 90 tablet 3    rosuvastatin (CRESTOR) 20 MG tablet Take 1 tablet (20 mg total) by mouth daily. 90 tablet 3   triamterene-hydrochlorothiazide (MAXZIDE-25) 37.5-25 MG tablet Take 1 tablet by mouth daily. 90 tablet 1   Semaglutide-Weight Management (WEGOVY) 0.25 MG/0.5ML SOAJ Inject 0.25 mg into the skin once a week. (Patient not taking: Reported on 08/24/2023) 2 mL 0   Semaglutide-Weight Management (WEGOVY) 0.5 MG/0.5ML SOAJ Inject 0.5 mg into the skin once a week. (Patient not taking: Reported on 08/24/2023) 2 mL 0   No current facility-administered medications on file prior to visit.     The following portions of the patient's history were reviewed and updated as appropriate: allergies, current medications, past family history, past medical history, past social history, past surgical history and problem list.  ROS Otherwise as in subjective above  Objective: BP 122/72   Pulse 90   Temp (!) 97.1 F (36.2 C)   Resp 16   Wt 209 lb 3.2 oz (94.9 kg)   LMP 11/12/2018 (Exact Date)   SpO2 96%   BMI 32.77 kg/m   General appearance: alert, no distress, well developed, well nourished HEENT: normocephalic, sclerae anicteric, conjunctiva pink and moist, TMs flat, nares patent, no discharge or erythema, pharynx normal Oral cavity: MMM, no lesions Neck: supple, no lymphadenopathy, no  thyromegaly, no masses Heart: RRR, normal S1, S2, no murmurs Lungs: scattered wheezes, no rhonchi or rales    Assessment: Encounter Diagnoses  Name Primary?   Moderate persistent asthma with acute exacerbation Yes   Cough productive of purulent sputum    Hidradenitis suppurativa    Impaired fasting blood sugar    BMI 32.0-32.9,adult      Plan: Get back on Symbicort twice a day for prevention in general.  She just started back on this for the last few days.  Continue albuterol rescue inhaler as needed 2 puffs every 4-6 hours.  Begin short-term prednisone taper.  Rest, hydrate well.  She declines nebulizer at home use  prescription today.  If much worse over the weekend such as persistent purulent cough sputum or blood-tinged mucus from coughing up then begin antibiotic.  Otherwise this may just be viral respiratory tract infection flaring up her asthma.  She plans to start [facility] weight loss medication in January.  She has 1 month of the 0.25 mg and 2 months of the 0.5 mg dosing at home that she has not started yet  Miyoko was seen today for cough.  Diagnoses and all orders for this visit:  Moderate persistent asthma with acute exacerbation  Cough productive of purulent sputum  Hidradenitis suppurativa  Impaired fasting blood sugar  BMI 32.0-32.9,adult  Other orders -     budesonide-formoterol (SYMBICORT) 160-4.5 MCG/ACT inhaler; Inhale 2 puffs into the lungs 2 (two) times daily. -     predniSONE (DELTASONE) 10 MG tablet; 6 tablets all together day 1, 5 tablets day 2, 4 tablets day 3, 3 tablets day 4, 2 tablets day 5, 1 tablet day 6. -     doxycycline (VIBRA-TABS) 100 MG tablet; Take 1 tablet (100 mg total) by mouth 2 (two) times daily.    Follow up: 19mo

## 2023-08-27 DIAGNOSIS — J4541 Moderate persistent asthma with (acute) exacerbation: Secondary | ICD-10-CM

## 2023-08-28 MED ORDER — ALBUTEROL SULFATE (2.5 MG/3ML) 0.083% IN NEBU: 2.5000 mg | INHALATION_SOLUTION | Freq: Four times a day (QID) | RESPIRATORY_TRACT | 0 refills | Status: AC | PRN

## 2023-09-08 ENCOUNTER — Ambulatory Visit (AMBULATORY_SURGERY_CENTER): Payer: Medicaid Other

## 2023-09-08 VITALS — Ht 67.0 in | Wt 206.0 lb

## 2023-09-08 DIAGNOSIS — Z1211 Encounter for screening for malignant neoplasm of colon: Secondary | ICD-10-CM

## 2023-09-08 MED ORDER — PEG 3350-KCL-NA BICARB-NACL 420 G PO SOLR: 4000.0000 mL | Freq: Once | ORAL | 0 refills | Status: AC

## 2023-09-08 NOTE — Progress Notes (Signed)
 No egg or soy allergy known to patient  No issues known to pt with past sedation with any surgeries or procedures Patient denies ever being told they had issues or difficulty with intubation  No FH of Malignant Hyperthermia Pt is on Wegovy  Pt is not on  home 02  Pt is not on blood thinners  Pt denies issues with constipation  No A fib or A flutter Have any cardiac testing pending--No Pt can ambulate  Pt denies use of chewing tobacco Discussed diabetic I weight loss medication holds Discussed NSAID holds Checked BMI Pt instructed to use Single[employer] or GoodRx for a price reduction on prep  Patient's chart reviewed by Norleen Schillings CNRA prior to previsit and patient appropriate for the [facility].  Pre visit completed and red dot placed by patient's name on their procedure day (on provider's schedule).

## 2023-09-29 ENCOUNTER — Encounter: Payer: Self-pay | Admitting: Gastroenterology

## 2023-09-29 ENCOUNTER — Ambulatory Visit (AMBULATORY_SURGERY_CENTER): Payer: Medicaid Other | Admitting: Gastroenterology

## 2023-09-29 VITALS — BP 129/88 | HR 89 | Temp 97.5°F | Resp 22 | Ht 67.0 in | Wt 206.0 lb

## 2023-09-29 DIAGNOSIS — D122 Benign neoplasm of ascending colon: Secondary | ICD-10-CM

## 2023-09-29 DIAGNOSIS — Q438 Other specified congenital malformations of intestine: Secondary | ICD-10-CM | POA: Diagnosis not present

## 2023-09-29 DIAGNOSIS — Z1211 Encounter for screening for malignant neoplasm of colon: Secondary | ICD-10-CM | POA: Diagnosis not present

## 2023-09-29 DIAGNOSIS — K635 Polyp of colon: Secondary | ICD-10-CM | POA: Diagnosis not present

## 2023-09-29 MED ORDER — SODIUM CHLORIDE 0.9 % IV SOLN: 500.0000 mL | INTRAVENOUS | Status: DC

## 2023-09-29 NOTE — Patient Instructions (Addendum)
Resume previous diet Continue present medications Await pathology results  Handouts/information given for polyps  YOU HAD AN ENDOSCOPIC PROCEDURE TODAY AT THE [facility]:   Refer to the procedure report that was given to you for any specific questions about what was found during the examination.  If the procedure report does not answer your questions, please call your gastroenterologist to clarify.  If you requested that your care partner not be given the details of your procedure findings, then the procedure report has been included in a sealed envelope for you to review at your convenience later.  YOU SHOULD EXPECT: Some feelings of bloating in the abdomen. Passage of more gas than usual.  Walking can help get rid of the air that was put into your GI tract during the procedure and reduce the bloating. If you had a lower endoscopy (such as a colonoscopy or flexible sigmoidoscopy) you may notice spotting of blood in your stool or on the toilet paper. If you underwent a bowel prep for your procedure, you may not have a normal bowel movement for a few days.  Please Note:  You might notice some irritation and congestion in your nose or some drainage.  This is from the oxygen used during your procedure.  There is no need for concern and it should clear up in a day or so.  SYMPTOMS TO REPORT IMMEDIATELY:  Following lower endoscopy (colonoscopy or flexible sigmoidoscopy):  Excessive amounts of blood in the stool  Significant tenderness or worsening of abdominal pains  Swelling of the abdomen that is new, acute  Fever of 100F or higher   For urgent or emergent issues, a gastroenterologist can be reached at any hour by calling [phone]. Do not use MyChart messaging for urgent concerns.    DIET:  We do recommend a small meal at first, but then you may proceed to your regular diet.  Drink plenty of fluids but you should avoid alcoholic beverages for 24 hours.  ACTIVITY:  You  should plan to take it easy for the rest of today and you should NOT DRIVE or use heavy machinery until tomorrow (because of the sedation medicines used during the test).    FOLLOW UP: Our staff will call the number listed on your records the next business day following your procedure.  We will call around 7:15- 8:00 am to check on you and address any questions or concerns that you may have regarding the information given to you following your procedure. If we do not reach you, we will leave a message.     If any biopsies were taken you will be contacted by phone or by letter within the next 1-3 weeks.  Please call us at [phone] if you have not heard about the biopsies in 3 weeks.    SIGNATURES/CONFIDENTIALITY: You and/or your care partner have signed paperwork which will be entered into your electronic medical record.  These signatures attest to the fact that that the information above on your After Visit Summary has been reviewed and is understood.  Full responsibility of the confidentiality of this discharge information lies with you and/or your care-partner.Resume previous diet Continue present medications Await pathology results  Handouts/information given for polyps, diverticulosis and hemorrhoids

## 2023-09-29 NOTE — Progress Notes (Signed)
 Sedate, gd SR, tolerated procedure well, VSS, report to RN

## 2023-09-29 NOTE — Progress Notes (Signed)
 Called to room to assist during endoscopic procedure.  Patient ID and intended procedure confirmed with present staff. Received instructions for my participation in the procedure from the performing physician.

## 2023-09-29 NOTE — Progress Notes (Signed)
[facility] History and Physical   Primary Care Physician:  Jac Canavan, PA-C   Reason for Procedure:   Colon cancer screening  Plan:    Screening colonoscopy     HPI: Alisha Coffey is a 48 y.o. female undergoing initial average risk screening colonoscopy.  She has no family history of colon cancer and no chronic GI symptoms.    Past Medical History:  Diagnosis Date   Acne    Anxiety    Chronic headache 2018   improved after treatment for anxiety 2018   History of sinusitis    Hypertension    Insomnia    Joint pain    Obesity    Uterine fibroid     Past Surgical History:  Procedure Laterality Date   ABDOMINAL HYSTERECTOMY N/A 09/07/2020   Procedure: HYSTERECTOMY ABDOMINAL;  Surgeon: Essie Hart, MD;  Location: [facility];  Service: Gynecology;  Laterality: N/A;   BILATERAL SALPINGECTOMY Bilateral 09/07/2020   Procedure: OPEN BILATERAL SALPINGECTOMY;  Surgeon: Essie Hart, MD;  Location: [facility];  Service: Gynecology;  Laterality: Bilateral;   BREAST REDUCTION SURGERY  2009, 2000   HERNIA REPAIR     REDUCTION MAMMAPLASTY Bilateral 2000   REDUCTION MAMMAPLASTY Bilateral 12/2007   TRIGGER FINGER RELEASE Bilateral 2024   Nov 1st and 21st   WISDOM TOOTH EXTRACTION      Prior to Admission medications   Medication Sig Start Date End Date Taking? Authorizing Provider  ALPRAZolam Prudy Feeler) 1 MG tablet TAKE 1 TABLET BY MOUTH DAILY AS NEEDED FOR ANXIETY 08/15/23  Yes Tysinger, Kermit Balo, PA-C  cetirizine (ZYRTEC) 10 MG tablet Take 1 tablet (10 mg total) by mouth at bedtime as needed for allergies. 05/24/23  Yes Tysinger, Kermit Balo, PA-C  losartan (COZAAR) 50 MG tablet TAKE 1 TABLET BY MOUTH EVERY DAY IN THE EVENING 08/16/23  Yes Tolia, Sunit, DO  rosuvastatin (CRESTOR) 20 MG tablet Take 1 tablet (20 mg total) by mouth daily. 05/24/23 05/23/24 Yes Tysinger, Kermit Balo, PA-C  triamterene-hydrochlorothiazide (MAXZIDE-25) 37.5-25 MG  tablet Take 1 tablet by mouth daily. 05/24/23 11/20/23 Yes Tysinger, Kermit Balo, PA-C  albuterol (PROVENTIL) (2.5 MG/3ML) 0.083% nebulizer solution Take 3 mLs (2.5 mg total) by nebulization every 6 (six) hours as needed for wheezing or shortness of breath. Patient not taking: Reported on 09/08/2023 08/28/23   Tysinger, Kermit Balo, PA-C  albuterol (VENTOLIN HFA) 108 (90 Base) MCG/ACT inhaler Inhale 2 puffs into the lungs every 6 (six) hours as needed for wheezing or shortness of breath. 05/24/23   Tysinger, Kermit Balo, PA-C  budesonide-formoterol (SYMBICORT) 160-4.5 MCG/ACT inhaler Inhale 2 puffs into the lungs 2 (two) times daily. 08/24/23   Tysinger, Kermit Balo, PA-C  fluticasone (FLONASE) 50 MCG/ACT nasal spray Place 2 sprays into both nostrils daily. 05/24/23   Tysinger, Kermit Balo, PA-C  gabapentin (NEURONTIN) 300 MG capsule Take 300 mg by mouth at bedtime. Patient not taking: Reported on 09/29/2023    [provider]  Semaglutide-Weight Management (WEGOVY) 0.25 MG/0.5ML SOAJ Inject 0.25 mg into the skin once a week. 06/05/23   Tysinger, Kermit Balo, PA-C  Semaglutide-Weight Management (WEGOVY) 0.5 MG/0.5ML SOAJ Inject 0.5 mg into the skin once a week. Patient not taking: Reported on 08/16/2023 06/05/23   Tysinger, Kermit Balo, PA-C    Current Outpatient Medications  Medication Sig Dispense Refill   ALPRAZolam (XANAX) 1 MG tablet TAKE 1 TABLET BY MOUTH DAILY AS NEEDED FOR ANXIETY 20 tablet 1   cetirizine (ZYRTEC) 10  MG tablet Take 1 tablet (10 mg total) by mouth at bedtime as needed for allergies. 90 tablet 3   losartan (COZAAR) 50 MG tablet TAKE 1 TABLET BY MOUTH EVERY DAY IN THE EVENING 90 tablet 3   rosuvastatin (CRESTOR) 20 MG tablet Take 1 tablet (20 mg total) by mouth daily. 90 tablet 3   triamterene-hydrochlorothiazide (MAXZIDE-25) 37.5-25 MG tablet Take 1 tablet by mouth daily. 90 tablet 1   albuterol (PROVENTIL) (2.5 MG/3ML) 0.083% nebulizer solution Take 3 mLs (2.5 mg total) by nebulization every 6 (six)  hours as needed for wheezing or shortness of breath. (Patient not taking: Reported on 09/08/2023) 150 mL 0   albuterol (VENTOLIN HFA) 108 (90 Base) MCG/ACT inhaler Inhale 2 puffs into the lungs every 6 (six) hours as needed for wheezing or shortness of breath. 18 each 1   budesonide-formoterol (SYMBICORT) 160-4.5 MCG/ACT inhaler Inhale 2 puffs into the lungs 2 (two) times daily. 10.2 g 5   fluticasone (FLONASE) 50 MCG/ACT nasal spray Place 2 sprays into both nostrils daily. 16 g 6   gabapentin (NEURONTIN) 300 MG capsule Take 300 mg by mouth at bedtime. (Patient not taking: Reported on 09/29/2023)     Semaglutide-Weight Management (WEGOVY) 0.25 MG/0.5ML SOAJ Inject 0.25 mg into the skin once a week. 2 mL 0   Semaglutide-Weight Management (WEGOVY) 0.5 MG/0.5ML SOAJ Inject 0.5 mg into the skin once a week. (Patient not taking: Reported on 08/16/2023) 2 mL 0   Current Facility-Administered Medications  Medication Dose Route Frequency Provider Last Rate Last Admin   0.9 %  sodium chloride infusion  500 mL Intravenous Continuous Jenel Lucks, MD        Allergies as of 09/29/2023 - Review Complete 09/29/2023  Allergen Reaction Noted   Hydrocodone-acetaminophen Nausea And Vomiting 06/19/2023   Diazepam Nausea And Vomiting and Other (See Comments) 02/14/2013   Gabapentin Other (See Comments) 08/21/2023   Propoxyphene Itching 03/30/2014   Suvorexant Other (See Comments) 03/06/2014   Tramadol Nausea And Vomiting 06/30/2023    Family History  Problem Relation Age of Onset   Hypertension Mother    Kidney Stones Mother    Stroke Maternal Aunt    Depression Maternal Grandmother    Hypertension Maternal Grandmother    Diabetes Maternal Grandfather    Hypertension Maternal Grandfather    Cancer Neg Hx    Heart disease Neg Hx    BRCA 1/2 Neg Hx    Breast cancer Neg Hx    Colon cancer Neg Hx    Rectal cancer Neg Hx    Stomach cancer Neg Hx    Esophageal cancer Neg Hx     Social History    Socioeconomic History   Marital status: Single    Spouse name: Not on file   Number of children: 2   Years of education: Not on file   Highest education level: Not on file  Occupational History   Occupation: Risk manager: [employer]    Comment: [employer]  Tobacco Use   Smoking status: Never   Smokeless tobacco: Never  Vaping Use   Vaping status: Never Used  Substance and Sexual Activity   Alcohol use: Yes    Alcohol/week: 1.0 standard drink of alcohol    Types: 1 Glasses of wine per week    Comment: occasional   Drug use: Yes    Comment: weekends   Sexual activity: Yes    Birth control/protection: Surgical  Other Topics Concern   Not  on file  Social History Narrative   Single.  2 daughters, Vanice Sarah.  Working at [employer], Curator.  11/2020   Social Drivers of Health   Financial Resource Strain: Not on file  Food Insecurity: Low Risk  (07/14/2023)   Received from [facility]   Hunger Vital Sign    Worried About Running Out of Food in the Last Year: Never true    Ran Out of Food in the Last Year: Never true  Transportation Needs: No Transportation Needs (07/14/2023)   Received from [facility]    In the past 12 months, has lack of reliable transportation kept you from medical appointments, meetings, work or from getting things needed for daily living? : No  Physical Activity: Not on file  Stress: Not on file  Social Connections: Not on file  Intimate Partner Violence: Not on file    Review of Systems:  All other review of systems negative except as mentioned in the HPI.  Physical Exam: Vital signs BP 138/89   Pulse 86   Temp (!) 97.5 F (36.4 C)   Ht 5\' 7"  (1.702 m)   Wt 206 lb (93.4 kg)   LMP 11/12/2018 (Exact Date)   SpO2 99%   BMI 32.26 kg/m   General:   Alert,  Well-developed, well-nourished, pleasant and cooperative in NAD Airway:  Mallampati 2 Lungs:  Clear throughout to  auscultation.   Heart:  Regular rate and rhythm; no murmurs, clicks, rubs,  or gallops. Abdomen:  Soft, nontender and nondistended. Normal bowel sounds.   Neuro/Psych:  Normal mood and affect. A and O x 3   Ardice Boyan E. Tomasa Rand, MD [facility]

## 2023-09-29 NOTE — Op Note (Signed)
[facility] Patient Name: Alisha Coffey Procedure Date: 09/29/2023 10:35 AM MRN: [id number] Endoscopist: Lorin Picket E. Tomasa Rand , MD, [id number] Age: 48 Referring MD:  Date of Birth: [birth year] Gender: Female Account #: [id number] Procedure:                Colonoscopy Indications:              Screening for colorectal malignant neoplasm, This                            is the patient's first colonoscopy Medicines:                Monitored Anesthesia Care Procedure:                Pre-Anesthesia Assessment:                           - Prior to the procedure, a History and Physical                            was performed, and patient medications and                            allergies were reviewed. The patient's tolerance of                            previous anesthesia was also reviewed. The risks                            and benefits of the procedure and the sedation                            options and risks were discussed with the patient.                            All questions were answered, and informed consent                            was obtained. Prior Anticoagulants: The patient has                            taken no anticoagulant or antiplatelet agents. ASA                            Grade Assessment: II - A patient with mild systemic                            disease. After reviewing the risks and benefits,                            the patient was deemed in satisfactory condition to                            undergo the procedure.  After obtaining informed consent, the colonoscope                            was passed under direct vision. Throughout the                            procedure, the patient's blood pressure, pulse, and                            oxygen saturations were monitored continuously. The                            Olympus Scope [id number] was introduced through the                            anus and  advanced to the the terminal ileum, with                            identification of the appendiceal orifice and IC                            valve. The colonoscopy was somewhat difficult due                            to a tortuous colon. The patient tolerated the                            procedure well. The quality of the bowel                            preparation was good. The terminal ileum, ileocecal                            valve, appendiceal orifice, and rectum were                            photographed. The bowel preparation used was                            GoLYTELY via split dose instruction. Scope In: 10:55:40 AM Scope Out: 11:17:26 AM Scope Withdrawal Time: 0 hours 15 minutes 19 seconds  Total Procedure Duration: 0 hours 21 minutes 46 seconds  Findings:                 The perianal and digital rectal examinations were                            normal. Pertinent negatives include normal                            sphincter tone and no palpable rectal lesions.                           A 6 mm polyp was found in the  distal ascending                            colon. The polyp was flat. The polyp was removed                            with a cold snare. Resection and retrieval were                            complete. Estimated blood loss was minimal.                           The exam was otherwise normal throughout the                            examined colon.                           The terminal ileum appeared normal.                           The retroflexed view of the distal rectum and anal                            verge was normal and showed no anal or rectal                            abnormalities. Complications:            No immediate complications. Estimated Blood Loss:     Estimated blood loss was minimal. Impression:               - One 6 mm polyp in the distal ascending colon,                            removed with a cold snare. Resected and  retrieved.                           - The examined portion of the ileum was normal.                           - The distal rectum and anal verge are normal on                            retroflexion view. Recommendation:           - Patient has a contact number available for                            emergencies. The signs and symptoms of potential                            delayed complications were discussed with the                            patient.  Return to normal activities tomorrow.                            Written discharge instructions were provided to the                            patient.                           - Resume previous diet.                           - Continue present medications.                           - Await pathology results.                           - Repeat colonoscopy (date not yet determined) for                            surveillance based on pathology results. [name], MD 09/29/2023 11:22:52 AM This report has been signed electronically.

## 2023-09-29 NOTE — Progress Notes (Signed)
Pt states no changes to health hx since previsit

## 2023-10-02 ENCOUNTER — Telehealth: Payer: Self-pay | Admitting: *Deleted

## 2023-10-02 NOTE — Telephone Encounter (Signed)
  Follow up Call-     09/29/2023   10:27 AM  Call back number  Post procedure Call Back phone  # [phone]  Permission to leave phone message Yes     Patient questions:  Do you have a fever, pain , or abdominal swelling? No. Pain Score  0 *  Have you tolerated food without any problems? Yes.    Have you been able to return to your normal activities? Yes.    Do you have any questions about your discharge instructions: Diet   No. Medications  No. Follow up visit  No.  Do you have questions or concerns about your Care? No.  Actions: * If pain score is 4 or above: No action needed, pain <4.

## 2023-10-03 ENCOUNTER — Encounter: Payer: Self-pay | Admitting: Gastroenterology

## 2023-10-03 LAB — SURGICAL PATHOLOGY

## 2023-10-03 NOTE — Progress Notes (Signed)
Alisha Coffey,  The polyp which I removed during your recent procedure was proven to be completely benign but is considered a "pre-cancerous" polyp that MAY have grown into cancer if it had not been removed.  Studies shows that at least 20% of women over age 46 and 30% of men over age 90 have pre-cancerous polyps.  Based on current nationally recognized surveillance guidelines, I recommend that you have a repeat colonoscopy in 7 years.   If you develop any new rectal bleeding, abdominal pain or significant bowel habit changes, please contact me before then.

## 2023-10-04 ENCOUNTER — Other Ambulatory Visit: Payer: Self-pay | Admitting: Medical

## 2023-11-01 ENCOUNTER — Ambulatory Visit: Payer: Self-pay | Admitting: Medical

## 2023-11-01 ENCOUNTER — Telehealth: Admitting: Family Medicine

## 2023-11-01 ENCOUNTER — Encounter: Payer: Self-pay | Admitting: Family Medicine

## 2023-11-01 VITALS — Temp 101.0°F | Ht 66.0 in | Wt 198.0 lb

## 2023-11-01 DIAGNOSIS — J111 Influenza due to unidentified influenza virus with other respiratory manifestations: Secondary | ICD-10-CM | POA: Diagnosis not present

## 2023-11-01 MED ORDER — OSELTAMIVIR PHOSPHATE 75 MG PO CAPS: 75.0000 mg | ORAL_CAPSULE | Freq: Two times a day (BID) | ORAL | 0 refills | Status: DC

## 2023-11-01 NOTE — Telephone Encounter (Signed)
 Copied from CRM [id number]. Topic: Clinical - Red Word Triage >> Nov 01, 2023  2:15 PM Fredrica W wrote: Red Word that prompted transfer to Nurse Triage: Chest pain, cough, Mucus, Fever, Chills, Body aches - daughter has flu   Chief Complaint: flu symptoms Symptoms: cough, sore throat., fever, body aches Frequency: gradual Pertinent Negatives: Patient denies chest pain Disposition: [] ED /[x] Urgent Care (no appt availability in office) / [] Appointment(In office/virtual)/ []  [facility]/ [] Home Care/ [] Refused Recommended Disposition /[] [place] [facility]/ []  Follow-up with PCP Additional Notes: Pt reports daughter diagnosed with flu yesterday, pt started having symptoms last night. Fever today 101.5. Pt has hx of asthma, sts that she had to use inhaler. RN advising UC as there is no appt avail until next week. Pt agreeable.   Reason for Disposition  Patient is HIGH RISK (e.g., age > 64 years, pregnant, HIV+, or chronic medical condition)  Answer Assessment - Initial Assessment Questions 1. TYPE of EXPOSURE: "How were you exposed?" (e.g., close contact, not a close contact)     Close contact, daughter diagnosed with flu yesterday  2. DATE of EXPOSURE: "When did the exposure occur?" (e.g., hour, days, weeks)     Daughter started having symptoms 2 days ago  3. PREGNANCY: "Is there any chance you are pregnant?" "When was your last menstrual period?"     No  4. HIGH RISK for COMPLICATIONS: "Do you have any heart or lung problems?" "Do you have a weakened immune system?" (e.g., CHF, COPD, asthma, HIV positive, chemotherapy, renal failure, diabetes mellitus, sickle cell anemia)     Asthma  5. SYMPTOMS: "Do you have any symptoms?" (e.g., cough, fever, sore throat, difficulty breathing).     Body aches, chills, fever, sore throat, cough since last night  Answer Assessment - Initial Assessment Questions 1. WORST SYMPTOM: "What is your worst symptom?" (e.g., cough, runny nose, muscle  aches, headache, sore throat, fever)      Body aches and chills  2. ONSET: "When did your flu symptoms start?"      Las night  3. COUGH: "How bad is the cough?"       Not too bad  4. RESPIRATORY DISTRESS: "Describe your breathing."      No shortness of breath  5. FEVER: "Do you have a fever?" If Yes, ask: "What is your temperature, how was it measured, and when did it start?"     Yes, didn't check fever, but was very "hot" this morning and face was flushed  6. EXPOSURE: "Were you exposed to someone with influenza?"       Yes  7. FLU VACCINE: "Did you get a flu shot this year?"     Yes, September 2024  8. HIGH RISK DISEASE: "Do you have any chronic medical problems?" (e.g., heart or lung disease, asthma, weak immune system, or other HIGH RISK conditions)     Asthma  9. PREGNANCY: "Is there any chance you are pregnant?" "When was your last menstrual period?"     No  10. OTHER SYMPTOMS: "Do you have any other symptoms?"  (e.g., runny nose, muscle aches, headache, sore throat)       Chills, cough, sore throat.  Protocols used: Influenza (Flu) Exposure-A-AH, Influenza (Flu) - Seasonal-A-AH

## 2023-11-01 NOTE — Progress Notes (Signed)
   Subjective:    Patient ID: Alisha Coffey, female    DOB: 04-28-1976, 48 y.o.   MRN: [id number]  HPI Documentation for virtual audio and video telecommunications through Caregility encounter: The patient was located at home. 2 patient identifiers used.  The provider was located in the office. The patient did consent to this visit and is aware of possible charges through their insurance for this visit. The other persons participating in this telemedicine service were none. Time spent on call was 5 minutes and in review of previous records >20  minutes total for counseling and coordination of care. This virtual service is not related to other E/M service within previous 7 days.  She states that yesterday she developed fever, chills, myalgias and slight cough.  Her daughter apparently has recently contracted the flu. Review of Systems     Objective:    Physical Exam Alert and in no distress but does not appear toxic.       Assessment & Plan:  Influenza - Plan: oseltamivir (TAMIFLU) 75 MG capsule I explained that I think she has the flu.  Recommend Tamiflu, Tylenol for fever aches and pains and also symptomatic care.  She is to call if she continues to have difficulty.

## 2023-11-28 ENCOUNTER — Other Ambulatory Visit: Payer: Self-pay | Admitting: Medical

## 2023-11-28 MED ORDER — WEGOVY 1 MG/0.5ML ~~LOC~~ SOAJ: 1.0000 mg | SUBCUTANEOUS | 0 refills | Status: DC

## 2023-11-29 ENCOUNTER — Other Ambulatory Visit (HOSPITAL_COMMUNITY): Payer: Self-pay

## 2023-11-29 ENCOUNTER — Telehealth: Payer: Self-pay

## 2023-11-29 ENCOUNTER — Telehealth: Payer: Self-pay | Admitting: Medical

## 2023-11-29 NOTE — Telephone Encounter (Signed)
 Has an appt Friday to discuss

## 2023-11-29 NOTE — Telephone Encounter (Signed)
 Fax from [facility]  Wegovy 0.5mg /0.5 ml pen Qty 2 Last filled 08/07/23

## 2023-11-29 NOTE — Telephone Encounter (Signed)
 Pharmacy Patient Advocate Encounter   Received notification from CoverMyMeds that prior authorization for Wegovy 1MG /0.5ML auto-injectors is required/requested.   Insurance verification completed.   The patient is insured through [facility] .   Per test claim: PA required; PA submitted to above mentioned insurance via CoverMyMeds Key/confirmation #/EOC (Key: [id number])   Status is pending

## 2023-11-30 ENCOUNTER — Encounter: Payer: Self-pay | Admitting: Internal Medicine

## 2023-11-30 ENCOUNTER — Other Ambulatory Visit (HOSPITAL_COMMUNITY): Payer: Self-pay

## 2023-11-30 MED ORDER — WEGOVY 1 MG/0.5ML ~~LOC~~ SOAJ: 1.0000 mg | SUBCUTANEOUS | 0 refills | Status: DC

## 2023-11-30 NOTE — Telephone Encounter (Signed)
 Pharmacy Patient Advocate Encounter  Received notification from [facility] that Prior Authorization for  [facility] 1MG /0.5ML auto-injectors has been APPROVED from 4.3.25 to 4.3.26. This test claim was processed through [facility]- copay amounts may vary at other pharmacies due to pharmacy/plan contracts, or as the patient moves through the different stages of their insurance plan.   Pt will also need to call her plan to finish signing up for her wegovy coverage if she hasn't already

## 2023-11-30 NOTE — Telephone Encounter (Signed)
 Pt wanted wegovy 1mg  sent to [facility]

## 2023-12-01 ENCOUNTER — Telehealth: Admitting: Medical

## 2023-12-01 VITALS — BP 120/78 | HR 83 | Wt 201.0 lb

## 2023-12-01 DIAGNOSIS — Z6832 Body mass index (BMI) 32.0-32.9, adult: Secondary | ICD-10-CM

## 2023-12-01 DIAGNOSIS — L732 Hidradenitis suppurativa: Secondary | ICD-10-CM | POA: Diagnosis not present

## 2023-12-01 DIAGNOSIS — E785 Hyperlipidemia, unspecified: Secondary | ICD-10-CM

## 2023-12-01 DIAGNOSIS — J4541 Moderate persistent asthma with (acute) exacerbation: Secondary | ICD-10-CM

## 2023-12-01 DIAGNOSIS — I10 Essential (primary) hypertension: Secondary | ICD-10-CM

## 2023-12-01 DIAGNOSIS — R7301 Impaired fasting glucose: Secondary | ICD-10-CM | POA: Diagnosis not present

## 2023-12-01 DIAGNOSIS — L0292 Furuncle, unspecified: Secondary | ICD-10-CM

## 2023-12-01 NOTE — Progress Notes (Signed)
 This visit type was conducted due to national recommendations for restrictions regarding the COVID-19 Pandemic (e.g. social distancing) in an effort to limit this patient's exposure and mitigate transmission in our community.  Due to their co-morbid illnesses, this patient is at least at moderate risk for complications without adequate follow up.  This format is felt to be most appropriate for this patient at this time.    Documentation for virtual audio and video telecommunications through [place] encounter:  The patient was located at home. The provider was located in the office. The patient did consent to this visit and is aware of possible charges through their insurance for this visit.  The other persons participating in this telemedicine service were none. Time spent on call was 20 minutes and in review of previous records >25 minutes total.  This virtual service is not related to other E/M service within previous 7 days.  Subjective:  Alisha Coffey is a 48 y.o. female who presents for Chief Complaint  Patient presents with   discuss refill for [facility]    Discuss Refill for [facility]. Just finished her 0.5mg . due for 1mg      Virtual visit for follow up on weight loss efforts.  She completed Wegovy 0.25 mg the first month and is now been on the 0.5 mg for about 2 months.  She is getting ready to go on the 1 mg which we just sent into the pharmacy this week.  She does the injection on Sunday.  On Monday gets a little bit of blah feeling and lack of appetite, Tuesday not a lot of appetite, Wednesday bloated and nauseated.    But by end of week, no bloating or nausea.  She does get some reflux with the medication.  She notes that she feels like she is getting improvements with weight.  She has lost weight.  She notices it more in the pant size.  She started at a pant size of 14 but now down to its size 12 pant.  She feels less bloated in the abdomen.  Current exercise: Walks her dog  daily.  Dietary efforts: Is drinking more water, eating more fruits and vegetables.  Doesn't have appetite for heavy portions and meals.  Some of her exercise has been limited because she had double carpal tunnel surgery end of 2024, and had colonoscopy 08/2023.   She feels as though the weight loss medication is helping her skin issues improve particular in the upper thighs.  She has not had as many boils in hidradenitis issues lately.  She does get some changes in the skin or moist skin in the upper thighs.  No other aggravating or relieving factors.    No other c/o.  Past Medical History:  Diagnosis Date   Acne    Anxiety    Chronic headache 2018   improved after treatment for anxiety 2018   History of sinusitis    Hypertension    Insomnia    Joint pain    Obesity    Uterine fibroid     Current Outpatient Medications on File Prior to Visit  Medication Sig Dispense Refill   albuterol (PROVENTIL) (2.5 MG/3ML) 0.083% nebulizer solution Take 3 mLs (2.5 mg total) by nebulization every 6 (six) hours as needed for wheezing or shortness of breath. 150 mL 0   albuterol (VENTOLIN HFA) 108 (90 Base) MCG/ACT inhaler Inhale 2 puffs into the lungs every 6 (six) hours as needed for wheezing or shortness of breath. 18 each  1   ALPRAZolam (XANAX) 1 MG tablet TAKE 1 TABLET BY MOUTH DAILY AS NEEDED FOR ANXIETY 20 tablet 1   budesonide-formoterol (SYMBICORT) 160-4.5 MCG/ACT inhaler Inhale 2 puffs into the lungs 2 (two) times daily. 10.2 g 5   cetirizine (ZYRTEC) 10 MG tablet Take 1 tablet (10 mg total) by mouth at bedtime as needed for allergies. 90 tablet 3   fluticasone (FLONASE) 50 MCG/ACT nasal spray Place 2 sprays into both nostrils daily. 16 g 6   losartan (COZAAR) 50 MG tablet TAKE 1 TABLET BY MOUTH EVERY DAY IN THE EVENING 90 tablet 3   rosuvastatin (CRESTOR) 20 MG tablet Take 1 tablet (20 mg total) by mouth daily. 90 tablet 3   triamterene-hydrochlorothiazide (MAXZIDE-25) 37.5-25 MG  tablet Take 1 tablet by mouth daily. 90 tablet 1   Semaglutide-Weight Management (WEGOVY) 1 MG/0.5ML SOAJ Inject 1 mg into the skin once a week. (Patient not taking: Reported on 12/01/2023) 2 mL 0   No current facility-administered medications on file prior to visit.    The following portions of the patient's history were reviewed and updated as appropriate: allergies, current medications, past family history, past medical history, past social history, past surgical history and problem list.  ROS Otherwise as in subjective above   Objective: BP 120/78   Pulse 83   Wt 201 lb (91.2 kg)   LMP 11/12/2018 (Exact Date)   BMI 32.44 kg/m   Wt Readings from Last 3 Encounters:  12/01/23 201 lb (91.2 kg)  11/01/23 198 lb (89.8 kg)  09/29/23 206 lb (93.4 kg)    General appearance: alert, no distress, well developed, well nourished   Assessment: Encounter Diagnoses  Name Primary?   BMI 32.0-32.9,adult Yes   Moderate persistent extrinsic asthma with acute exacerbation    Hidradenitis suppurativa    Moderate persistent asthma with acute exacerbation    Impaired fasting blood sugar    Hyperlipidemia, unspecified hyperlipidemia type    Primary hypertension    Recurrent boils      Plan: We discussed her symptoms and concerns.  She will transition to the 1 mg when she completes her 0.5 mg Wegovy.  She will let me know before she runs out of the 1 mg so I can send in the 1.7 mg Wegovy.  I will need to see her back in person in about 6 to 8 weeks before going up to the highest dose 2.4 mg Wegovy.  We discussed potential side effects and the risk.  Advised that she can use some over-the-counter medicine for nausea or constipation or acid reflux as needed or I can send some prescription if this gets to be a problem  Monitor blood pressures at home.  If any blood pressures less than 105/65, then we may need to back off blood pressure medication  For now continue current medications.  [name]  was seen today for discuss refill for wegovy.  Diagnoses and all orders for this visit:  BMI 32.0-32.9,adult  Moderate persistent extrinsic asthma with acute exacerbation  Hidradenitis suppurativa  Moderate persistent asthma with acute exacerbation  Impaired fasting blood sugar  Hyperlipidemia, unspecified hyperlipidemia type  Primary hypertension  Recurrent boils    Follow up: 6-8 weeks

## 2023-12-07 ENCOUNTER — Other Ambulatory Visit: Payer: Self-pay | Admitting: Medical

## 2023-12-07 MED ORDER — KETOCONAZOLE 200 MG PO TABS: 200.0000 mg | ORAL_TABLET | Freq: Every day | ORAL | 0 refills | Status: AC

## 2023-12-07 MED ORDER — CLOTRIMAZOLE-BETAMETHASONE 1-0.05 % EX CREA: 1.0000 | TOPICAL_CREAM | Freq: Two times a day (BID) | CUTANEOUS | 0 refills | Status: AC

## 2023-12-20 ENCOUNTER — Other Ambulatory Visit: Payer: Self-pay | Admitting: Medical

## 2023-12-20 MED ORDER — WEGOVY 1.7 MG/0.75ML ~~LOC~~ SOAJ
1.7000 mg | SUBCUTANEOUS | 0 refills | Status: DC
Start: 2023-12-20 — End: 2024-02-27

## 2023-12-20 MED ORDER — WEGOVY 2.4 MG/0.75ML ~~LOC~~ SOAJ
2.4000 mg | SUBCUTANEOUS | 0 refills | Status: DC
Start: 2023-12-20 — End: 2024-02-27

## 2024-01-29 ENCOUNTER — Other Ambulatory Visit (HOSPITAL_COMMUNITY): Payer: Self-pay

## 2024-01-30 ENCOUNTER — Other Ambulatory Visit (HOSPITAL_COMMUNITY): Payer: Self-pay

## 2024-01-30 ENCOUNTER — Ambulatory Visit: Payer: Self-pay

## 2024-01-30 NOTE — Telephone Encounter (Signed)
 Spoke to patient through mychart about this already

## 2024-01-30 NOTE — Telephone Encounter (Signed)
 Copied from CRM [id number]. Topic: Clinical - Medical Advice >> Jan 30, 2024  3:13 PM Alisha Coffey wrote: Reason for CRM: pt is taking  Wegovy  and wants to know if she has to inject her self in the same spot all the time or can she change up the spots. If she can get a call on this question

## 2024-02-07 ENCOUNTER — Other Ambulatory Visit: Payer: Self-pay | Admitting: Medical

## 2024-02-27 ENCOUNTER — Ambulatory Visit: Admitting: Medical

## 2024-02-27 VITALS — BP 110/68 | HR 107 | Wt 185.0 lb

## 2024-02-27 DIAGNOSIS — E785 Hyperlipidemia, unspecified: Secondary | ICD-10-CM | POA: Diagnosis not present

## 2024-02-27 DIAGNOSIS — L732 Hidradenitis suppurativa: Secondary | ICD-10-CM | POA: Diagnosis not present

## 2024-02-27 DIAGNOSIS — R7301 Impaired fasting glucose: Secondary | ICD-10-CM | POA: Diagnosis not present

## 2024-02-27 DIAGNOSIS — M549 Dorsalgia, unspecified: Secondary | ICD-10-CM | POA: Diagnosis not present

## 2024-02-27 DIAGNOSIS — Z6829 Body mass index (BMI) 29.0-29.9, adult: Secondary | ICD-10-CM | POA: Insufficient documentation

## 2024-02-27 DIAGNOSIS — I10 Essential (primary) hypertension: Secondary | ICD-10-CM

## 2024-02-27 MED ORDER — WEGOVY 2.4 MG/0.75ML ~~LOC~~ SOAJ: 2.4000 mg | SUBCUTANEOUS | 2 refills | Status: DC

## 2024-02-27 MED ORDER — TIZANIDINE HCL 4 MG PO TABS
4.0000 mg | ORAL_TABLET | Freq: Every evening | ORAL | 0 refills | Status: DC | PRN
Start: 2024-02-27 — End: 2024-05-01

## 2024-02-27 NOTE — Progress Notes (Signed)
 Subjective:  Alisha Coffey is a 48 y.o. female who presents for Chief Complaint  Patient presents with   Follow-up    Follow-up on Wegovy , no concerns, having some back pain every day. 2 months ago was mopping and felt like she pulled something, feels like it goes down spine     Here for med management.  Here for recheck on Wegovy  and weight loss efforts.  She is doing quite well with the Wegovy .  She is now at the highest dose 2.4 mg.  She has had some nausea.  Interestingly she started using injection in her arms instead of her abdomen and the nausea went away.  She had seen online where other people were doing the injection in other places.  She is really happy with her progress.  She is exercising somewhat regularly.  She she does notice a huge improvement in her hidradenitis skin issues since being on the Wegovy .  She also complains of back pain.  For the last 2 months she has been getting pains all through her back like she pulled a muscle.  She did feel like she pulled a muscle mopping within the last 2 months but no fall or other injury.  Sometimes she gets pain going into the right hip and leg.  Sometimes she can get tingling in the right leg.  She would like some type of intervention with the back pain.  She also wants to check her labs for liver and kidney since she is feeling this pain.  No other urinary complaint.  She notes that she went to [facility] back in April for back pain and had an x-ray that was reportedly normal.  The treatment they gave her did not help.  She has not been taking her losartan  since her blood pressures have been looking a lot better.  She does continue on her Dyazide and Crestor  regularly.  No other aggravating or relieving factors.    No other c/o.  Past Medical History:  Diagnosis Date   Acne    Anxiety    Chronic headache 2018   improved after treatment for anxiety 2018   History of sinusitis    Hypertension    Insomnia    Joint pain     Obesity    Uterine fibroid     Current Outpatient Medications on File Prior to Visit  Medication Sig Dispense Refill   albuterol  (PROVENTIL ) (2.5 MG/3ML) 0.083% nebulizer solution Take 3 mLs (2.5 mg total) by nebulization every 6 (six) hours as needed for wheezing or shortness of breath. 150 mL 0   albuterol  (VENTOLIN  HFA) 108 (90 Base) MCG/ACT inhaler Inhale 2 puffs into the lungs every 6 (six) hours as needed for wheezing or shortness of breath. 18 each 1   ALPRAZolam  (XANAX ) 1 MG tablet TAKE 1 TABLET BY MOUTH EVERY DAY AS NEEDED FOR ANXIETY 20 tablet 1   budesonide -formoterol  (SYMBICORT ) 160-4.5 MCG/ACT inhaler Inhale 2 puffs into the lungs 2 (two) times daily. 10.2 g 5   cetirizine  (ZYRTEC ) 10 MG tablet Take 1 tablet (10 mg total) by mouth at bedtime as needed for allergies. 90 tablet 3   clotrimazole -betamethasone  (LOTRISONE ) cream Apply 1 Application topically 2 (two) times daily. 30 g 0   fluticasone  (FLONASE ) 50 MCG/ACT nasal spray Place 2 sprays into both nostrils daily. 16 g 6   ketoconazole  (NIZORAL ) 200 MG tablet Take 1 tablet (200 mg total) by mouth daily. 7 tablet 0   rosuvastatin  (CRESTOR ) 20 MG tablet Take  1 tablet (20 mg total) by mouth daily. 90 tablet 3   triamterene -hydrochlorothiazide (MAXZIDE-25) 37.5-25 MG tablet Take 1 tablet by mouth daily. 90 tablet 1   No current facility-administered medications on file prior to visit.    The following portions of the patient's history were reviewed and updated as appropriate: allergies, current medications, past family history, past medical history, past social history, past surgical history and problem list.  ROS Otherwise as in subjective above     Objective: BP 110/68   Pulse (!) 107   Wt 185 lb (83.9 kg)   LMP 11/12/2018 (Exact Date)   SpO2 96%   BMI 29.86 kg/m   Wt Readings from Last 3 Encounters:  02/27/24 185 lb (83.9 kg)  12/01/23 201 lb (91.2 kg)  11/01/23 198 lb (89.8 kg)   BP Readings from Last 3  Encounters:  02/27/24 110/68  12/01/23 120/78  09/29/23 129/88   General appearance: alert, no distress, well developed, well nourished Seemingly tender throughout the paraspinal muscular area of her back upper mid and lower.  Some tenderness in the midline spine upper back but not mid to lower back.  Range of motion is normal.  No obvious deformity or swelling or discoloration.  No asymmetry or scoliosis. She has mild tenderness in the right lateral hip but otherwise legs nontender with normal range of motion Legs neurovascularly intact    Assessment: Encounter Diagnoses  Name Primary?   Bilateral back pain, unspecified back location, unspecified chronicity Yes   Hidradenitis suppurativa    Impaired fasting blood sugar    Hyperlipidemia, unspecified hyperlipidemia type    Primary hypertension    BMI 29.0-29.9,adult    Morbid obesity (HCC)       Plan: Obesity, BMI 29 currently-she is done quite well on the Wegovy .  Continue 2.4 mg Wegovy .  Discussed possible risk and benefits and proper use of the medication including injection site.  Discussed continuing efforts with exercise several days a week and healthy diet.  Hidradenitis suppurative much improved since losing weight and using the Wegovy   Hyperlipidemia-continue rosuvastatin  Crestor  20 mg daily  Hypertension-discontinue losartan .  Continue Dyazide for now.  Monitor blood pressures at home.  Goal blood pressure is 120/70.  Hydrate well with water throughout the day.  Back pain, musculoskeletal pain-referral to physical therapy.  We will request a copy of x-ray notes from orthopedics from April 2025.  She can use over-the-counter NSAID periodically.  Tizanidine muscle laxer can be used periodically.  Caution with sedation.   Alisha Coffey was seen today for follow-up.  Diagnoses and all orders for this visit:  Bilateral back pain, unspecified back location, unspecified chronicity -     Ambulatory referral to Physical  Therapy  Hidradenitis suppurativa  Impaired fasting blood sugar -     Comprehensive metabolic panel with GFR  Hyperlipidemia, unspecified hyperlipidemia type  Primary hypertension -     Comprehensive metabolic panel with GFR  BMI 29.0-29.9,adult  Morbid obesity (HCC)  Other orders -     tiZANidine (ZANAFLEX) 4 MG tablet; Take 1 tablet (4 mg total) by mouth at bedtime as needed for muscle spasms. -     Semaglutide -Weight Management (WEGOVY ) 2.4 MG/0.75ML SOAJ; Inject 2.4 mg into the skin once a week.    Follow up: 2 to 3 months

## 2024-02-28 ENCOUNTER — Ambulatory Visit: Payer: Self-pay | Admitting: Medical

## 2024-02-28 LAB — COMPREHENSIVE METABOLIC PANEL WITH GFR
ALT: 21 IU/L (ref 0–32)
AST: 22 IU/L (ref 0–40)
Albumin: 4.6 g/dL (ref 3.9–4.9)
Alkaline Phosphatase: 65 IU/L (ref 44–121)
BUN/Creatinine Ratio: 13 (ref 9–23)
BUN: 12 mg/dL (ref 6–24)
Bilirubin Total: 0.5 mg/dL (ref 0.0–1.2)
CO2: 19 mmol/L — ABNORMAL LOW (ref 20–29)
Calcium: 9.9 mg/dL (ref 8.7–10.2)
Chloride: 99 mmol/L (ref 96–106)
Creatinine, Ser: 0.9 mg/dL (ref 0.57–1.00)
Globulin, Total: 2.7 g/dL (ref 1.5–4.5)
Glucose: 103 mg/dL — ABNORMAL HIGH (ref 70–99)
Potassium: 3.6 mmol/L (ref 3.5–5.2)
Sodium: 137 mmol/L (ref 134–144)
Total Protein: 7.3 g/dL (ref 6.0–8.5)
eGFR: 80 mL/min/{1.73_m2} (ref >59)

## 2024-02-28 NOTE — Progress Notes (Signed)
 Results sent through MyChart

## 2024-03-21 ENCOUNTER — Other Ambulatory Visit: Payer: Self-pay | Admitting: Medical

## 2024-03-21 DIAGNOSIS — I10 Essential (primary) hypertension: Secondary | ICD-10-CM

## 2024-05-01 ENCOUNTER — Telehealth: Payer: Self-pay | Admitting: Medical

## 2024-05-01 ENCOUNTER — Other Ambulatory Visit: Payer: Self-pay | Admitting: Medical

## 2024-05-01 MED ORDER — TIZANIDINE HCL 4 MG PO TABS: 4.0000 mg | ORAL_TABLET | Freq: Every evening | ORAL | 0 refills | Status: DC | PRN

## 2024-05-01 NOTE — Telephone Encounter (Signed)
 Pt requesting a refill on tizanidine  to [facility] - [place], [place] - [address]

## 2024-05-28 ENCOUNTER — Encounter: Payer: Medicaid Other | Admitting: Medical

## 2024-06-19 ENCOUNTER — Ambulatory Visit: Admitting: Medical

## 2024-06-19 VITALS — BP 112/70 | HR 91 | Wt 186.2 lb

## 2024-06-19 DIAGNOSIS — I10 Essential (primary) hypertension: Secondary | ICD-10-CM

## 2024-06-19 DIAGNOSIS — B369 Superficial mycosis, unspecified: Secondary | ICD-10-CM | POA: Diagnosis not present

## 2024-06-19 DIAGNOSIS — R7301 Impaired fasting glucose: Secondary | ICD-10-CM

## 2024-06-19 DIAGNOSIS — Z7689 Persons encountering health services in other specified circumstances: Secondary | ICD-10-CM

## 2024-06-19 DIAGNOSIS — F419 Anxiety disorder, unspecified: Secondary | ICD-10-CM

## 2024-06-19 DIAGNOSIS — F430 Acute stress reaction: Secondary | ICD-10-CM

## 2024-06-19 DIAGNOSIS — Z23 Encounter for immunization: Secondary | ICD-10-CM

## 2024-06-19 DIAGNOSIS — R519 Headache, unspecified: Secondary | ICD-10-CM

## 2024-06-19 DIAGNOSIS — K30 Functional dyspepsia: Secondary | ICD-10-CM

## 2024-06-19 DIAGNOSIS — G8929 Other chronic pain: Secondary | ICD-10-CM

## 2024-06-19 DIAGNOSIS — G43909 Migraine, unspecified, not intractable, without status migrainosus: Secondary | ICD-10-CM

## 2024-06-19 DIAGNOSIS — R110 Nausea: Secondary | ICD-10-CM

## 2024-06-19 DIAGNOSIS — J4541 Moderate persistent asthma with (acute) exacerbation: Secondary | ICD-10-CM

## 2024-06-19 MED ORDER — WEGOVY 1.7 MG/0.75ML ~~LOC~~ SOAJ: 1.7000 mg | SUBCUTANEOUS | 2 refills | Status: AC

## 2024-06-19 MED ORDER — MAGNESIUM 400 MG PO TABS: 1.0000 | ORAL_TABLET | Freq: Every day | ORAL | 1 refills | Status: DC

## 2024-06-19 MED ORDER — ONDANSETRON 4 MG PO TBDP: 4.0000 mg | ORAL_TABLET | Freq: Three times a day (TID) | ORAL | 0 refills | Status: DC | PRN

## 2024-06-19 MED ORDER — NYSTATIN 100000 UNIT/GM EX POWD
1.0000 | Freq: Three times a day (TID) | CUTANEOUS | 1 refills | Status: AC
Start: 2024-06-19 — End: ?

## 2024-06-19 MED ORDER — TIZANIDINE HCL 4 MG PO TABS: 4.0000 mg | ORAL_TABLET | Freq: Every evening | ORAL | 0 refills | Status: AC | PRN

## 2024-06-19 MED ORDER — ONDANSETRON 4 MG PO TBDP: 4.0000 mg | ORAL_TABLET | Freq: Three times a day (TID) | ORAL | 0 refills | Status: AC | PRN

## 2024-06-19 MED ORDER — FAMOTIDINE 40 MG PO TABS: 40.0000 mg | ORAL_TABLET | Freq: Every day | ORAL | 0 refills | Status: DC

## 2024-06-19 MED ORDER — TRIAMTERENE-HCTZ 37.5-25 MG PO TABS
1.0000 | ORAL_TABLET | Freq: Every day | ORAL | 3 refills | Status: DC
Start: 2024-06-19 — End: 2024-06-24

## 2024-06-19 MED ORDER — CLOTRIMAZOLE-BETAMETHASONE 1-0.05 % EX CREA: 1.0000 | TOPICAL_CREAM | Freq: Every day | CUTANEOUS | 0 refills | Status: AC

## 2024-06-19 MED ORDER — FAMOTIDINE 40 MG PO TABS
40.0000 mg | ORAL_TABLET | Freq: Every day | ORAL | 1 refills | Status: DC
Start: 2024-06-19 — End: 2024-06-19

## 2024-06-19 MED ORDER — CLOTRIMAZOLE-BETAMETHASONE 1-0.05 % EX CREA: 1.0000 | TOPICAL_CREAM | Freq: Every day | CUTANEOUS | 0 refills | Status: DC

## 2024-06-19 NOTE — Patient Instructions (Signed)
 Obesity She experienced significant nausea and gastrointestinal symptoms on the highest dose of GLP-1 agonist, possibly due to a faulty batch. Weight increased from 179 lbs to 186 lbs, with stressors potentially contributing. - Reduce GLP-1 agonist dose to 1.7 mg or 1 mg based on preference. - Administer GLP-1 agonist every other week. - Instruct her to report batch issues to the pharmacist. - Prescribe Zofran  for nausea as needed. - Prescribe Pepcid for indigestion as needed.  Fungal rash (intertrigo) Recurrent rash at the top of the buttock crease, itchy and possibly fungal, similar to a previous rash. Rash between breasts as well both appear to be fungal -Begin Lotrisone  cream for about a week -Begin nystatin powder you can use ongoing to help with fungal rash and drying  Nausea -Prescribe Zofran  for nausea as needed -Nausea is a medication adverse effect of Wegovy   Indigestion -Prescribe Zofran  as needed - Indigestion is a medication adverse effect of Wegovy   Migraines -Work on stress reduction where possible --You can use Excedrin as needed for acute therapy -Begin magnesium 400 mg daily to help with migraines  Anxiety -Work on stress reduction where possible, follow-up with psychiatry as planned  Influenza and COVID vaccines given today at her request

## 2024-06-19 NOTE — Progress Notes (Signed)
 Name: Alisha Coffey   Date of Visit: 06/19/24   Date of last visit with me: 05/01/2024   CHIEF COMPLAINT:  Chief Complaint  Patient presents with   Medical Management of Chronic Issues    Med check, not taking wegovy - been super sick, nauseated, vomiting, had a rash between breasts and top of bottom        HPI:  Discussed the use of AI scribe software for clinical note transcription with the patient, who gave verbal consent to proceed.  History of Present Illness  History of Present Illness Alisha Coffey is a 48 year old female who presents for a medication check  She takes Wegovy  2.4mg  weekly for wieght management but in the past month has had lots of problems with this batch.  She has been on the highest dose of her medication, 2.4 mg, for some time with minimal side effects until the most recent batch.  After taking the first dose from this batch, she experienced severe nausea and acid reflux for an entire week. She attempted to manage these symptoms with ginger tea and rest. The second dose from the batch malfunctioned as the needle broke, resulting in the medication not being administered properly. The third dose again caused severe sickness, leading her to refrain from taking the fourth dose for the past two weeks due to the severity of the side effects, which included nausea that persisted for several days, impacting her ability to work.  Her weight has been stable at 186 lbs for the past three months, although she had previously dropped to 179 lbs. She attributes some weight fluctuation to recent life stressors, including the passing of a cousin, her brother's hospitalization, and managing her father's care.  She reports a rash on the inside of her thigh that was previously treated with a medication that cleared it up. She now experiences a similar rash at the top of her buttocks, which is itchy and patchy.  She is having migraines regularly of late with stressors.   She will  be seeing psychiatry Dr. Vincente next month and may need to take temporary leave of abscne.  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     Past Medical History:  Diagnosis Date   Acne    Anxiety    Chronic headache 2018   improved after treatment for anxiety 2018   History of sinusitis    Hypertension    Insomnia    Joint pain    Obesity    Uterine fibroid    Current Outpatient Medications on File Prior to Visit  Medication Sig Dispense Refill   albuterol  (PROVENTIL ) (2.5 MG/3ML) 0.083% nebulizer solution Take 3 mLs (2.5 mg total) by nebulization every 6 (six) hours as needed for wheezing or shortness of breath. 150 mL 0   albuterol  (VENTOLIN  HFA) 108 (90 Base) MCG/ACT inhaler Inhale 2 puffs into the lungs every 6 (six) hours as needed for wheezing or shortness of breath. 18 each 1   ALPRAZolam  (XANAX ) 1 MG tablet TAKE 1 TABLET BY MOUTH EVERY DAY AS NEEDED FOR ANXIETY 20 tablet 1   budesonide -formoterol  (SYMBICORT ) 160-4.5 MCG/ACT inhaler Inhale 2 puffs into the lungs 2 (two) times daily. 10.2 g 5   cetirizine  (ZYRTEC ) 10 MG tablet Take 1 tablet (10 mg total) by mouth at bedtime as needed for allergies. 90 tablet 3   clotrimazole -betamethasone  (LOTRISONE ) cream Apply 1 Application topically 2 (two) times daily. 30 g 0   fluticasone  (FLONASE ) 50  MCG/ACT nasal spray Place 2 sprays into both nostrils daily. 16 g 6   rosuvastatin  (CRESTOR ) 20 MG tablet Take 1 tablet (20 mg total) by mouth daily. 90 tablet 3   ketoconazole  (NIZORAL ) 200 MG tablet Take 1 tablet (200 mg total) by mouth daily. 7 tablet 0   No current facility-administered medications on file prior to visit.   ROS as in subjective  Objective BP 112/70   Pulse 91   Wt 186 lb 3.2 oz (84.5 kg)   LMP 11/12/2018 (Exact Date)   BMI 30.05 kg/m   General appearence: alert, no distress, WD/WN,  HEENT: normocephalic, sclerae anicteric Neuro: cn2-12 intact, nonfoncal exam Psyh: pleasant, good eye contact, answers questions  approaptoely Skin: Pinkish-red rash along the gluteal cleft and along the right medial breast, exam chaperoned by nurse Pulses: 2+ symmetric, upper and lower extremities, normal cap refill     Assessment and Plan Encounter Diagnoses  Name Primary?   Fungal rash of trunk Yes   Morbid obesity (HCC)    Encounter for weight management    Needs flu shot    COVID-19 vaccine administered    Migraine without status migrainosus, not intractable, unspecified migraine type    Acute stress reaction    Anxiety    Chronic nonintractable headache, unspecified headache type    Impaired fasting blood sugar    Moderate persistent asthma with acute exacerbation    Nausea    Indigestion    Benign hypertension     Morbid Obesity associated with asthma She experienced significant nausea and gastrointestinal symptoms on the highest dose of GLP-1 agonist, possibly due to a faulty batch. Weight increased from 179 lbs to 186 lbs, with stressors potentially contributing. - Reduce GLP-1 agonist dose to 1.7 mg or 1 mg based on preference. - Administer GLP-1 agonist every other week. - Instruct her to report batch issues to the pharmacist. - Prescribe Zofran  for nausea as needed. - Prescribe Pepcid for indigestion as needed.  Fungal rash (intertrigo) Recurrent rash at the top of the buttock crease, itchy and possibly fungal, similar to a previous rash. Rash between breasts as well both appear to be fungal -Begin Lotrisone  cream for about a week -Begin nystatin powder you can use ongoing to help with fungal rash and drying  Nausea -Prescribe Zofran  for nausea as needed -Nausea is a medication adverse effect of Wegovy   Indigestion -Prescribe Zofran  as needed - Indigestion is a medication adverse effect of Wegovy   Migraines -Work on stress reduction where possible --You can use Excedrin as needed for acute therapy -Begin magnesium 400 mg daily to help with migraines  Anxiety -Work on stress  reduction where possible, follow-up with psychiatry as planned  Influenza and COVID vaccines given today at her request   Denine was seen today for medical management of chronic issues.  Diagnoses and all orders for this visit:  Fungal rash of trunk  Morbid obesity (HCC)  Encounter for weight management  Needs flu shot -     Flu vaccine trivalent PF, 6mos and older(Flulaval,Afluria,Fluarix,Fluzone)  COVID-19 vaccine administered Best boy Vaccine 6yrs & older  Migraine without status migrainosus, not intractable, unspecified migraine type  Acute stress reaction  Anxiety  Chronic nonintractable headache, unspecified headache type  Impaired fasting blood sugar  Moderate persistent asthma with acute exacerbation  Nausea  Indigestion  Benign hypertension -     triamterene -hydrochlorothiazide (MAXZIDE-25) 37.5-25 MG tablet; Take 1 tablet by mouth daily.  Other orders -  semaglutide -weight management (WEGOVY ) 1.7 MG/0.75ML SOAJ SQ injection; Inject 1.7 mg into the skin once a week. -     Discontinue: ondansetron  (ZOFRAN -ODT) 4 MG disintegrating tablet; Take 1 tablet (4 mg total) by mouth every 8 (eight) hours as needed for nausea or vomiting. -     Discontinue: famotidine (PEPCID) 40 MG tablet; Take 1 tablet (40 mg total) by mouth daily. -     Discontinue: clotrimazole -betamethasone  (LOTRISONE ) cream; Apply 1 Application topically daily. -     nystatin (MYCOSTATIN/NYSTOP) powder; Apply 1 Application topically 3 (three) times daily. -     famotidine (PEPCID) 40 MG tablet; Take 1 tablet (40 mg total) by mouth daily. -     ondansetron  (ZOFRAN -ODT) 4 MG disintegrating tablet; Take 1 tablet (4 mg total) by mouth every 8 (eight) hours as needed for nausea or vomiting. -     clotrimazole -betamethasone  (LOTRISONE ) cream; Apply 1 Application topically daily. -     tiZANidine  (ZANAFLEX ) 4 MG tablet; Take 1 tablet (4 mg total) by mouth at bedtime as needed for  muscle spasms. -     Magnesium 400 MG TABS; Take 1 tablet by mouth daily.   F/u 53mo

## 2024-06-22 ENCOUNTER — Other Ambulatory Visit: Payer: Self-pay | Admitting: Medical

## 2024-06-22 DIAGNOSIS — I10 Essential (primary) hypertension: Secondary | ICD-10-CM

## 2024-07-05 ENCOUNTER — Other Ambulatory Visit (HOSPITAL_COMMUNITY): Payer: Self-pay

## 2024-07-09 ENCOUNTER — Other Ambulatory Visit (HOSPITAL_COMMUNITY): Payer: Self-pay

## 2024-07-22 ENCOUNTER — Other Ambulatory Visit: Payer: Self-pay | Admitting: Medical

## 2024-07-22 DIAGNOSIS — Z1231 Encounter for screening mammogram for malignant neoplasm of breast: Secondary | ICD-10-CM

## 2024-07-30 ENCOUNTER — Other Ambulatory Visit: Payer: Self-pay | Admitting: Medical

## 2024-07-30 ENCOUNTER — Other Ambulatory Visit: Payer: Self-pay | Admitting: Family Medicine

## 2024-07-30 DIAGNOSIS — J301 Allergic rhinitis due to pollen: Secondary | ICD-10-CM

## 2024-07-30 MED ORDER — FLUTICASONE PROPIONATE 50 MCG/ACT NA SUSP: NASAL | 2 refills | Status: AC

## 2024-07-30 NOTE — Addendum Note (Signed)
 Addended by: VICCI HUSBAND A on: 07/30/2024 04:36 PM   Modules accepted: Orders

## 2024-07-31 NOTE — Telephone Encounter (Signed)
 Last appt 06/19/24

## 2024-08-09 ENCOUNTER — Ambulatory Visit: Admitting: Internal Medicine

## 2024-08-09 ENCOUNTER — Ambulatory Visit: Payer: Self-pay

## 2024-08-09 ENCOUNTER — Other Ambulatory Visit: Payer: Self-pay | Admitting: Medical

## 2024-08-09 ENCOUNTER — Telehealth: Payer: Self-pay | Admitting: Medical

## 2024-08-09 VITALS — BP 140/100 | HR 102 | Temp 98.5°F | Ht 67.0 in

## 2024-08-09 DIAGNOSIS — Z6829 Body mass index (BMI) 29.0-29.9, adult: Secondary | ICD-10-CM

## 2024-08-09 DIAGNOSIS — E861 Hypovolemia: Secondary | ICD-10-CM

## 2024-08-09 DIAGNOSIS — R1110 Vomiting, unspecified: Secondary | ICD-10-CM

## 2024-08-09 DIAGNOSIS — R112 Nausea with vomiting, unspecified: Secondary | ICD-10-CM | POA: Diagnosis not present

## 2024-08-09 DIAGNOSIS — E7439 Other disorders of intestinal carbohydrate absorption: Secondary | ICD-10-CM

## 2024-08-09 MED ORDER — ONDANSETRON HCL 4 MG/2ML IJ SOLN
4.0000 mg | Freq: Once | INTRAMUSCULAR | Status: AC
  Administered 2024-08-09: 4 mg via INTRAMUSCULAR

## 2024-08-09 MED ORDER — PROMETHAZINE HCL 12.5 MG RE SUPP: 12.5000 mg | Freq: Four times a day (QID) | RECTAL | 0 refills | Status: AC | PRN

## 2024-08-09 MED ORDER — PROMETHAZINE HCL 12.5 MG RE SUPP: 12.5000 mg | Freq: Four times a day (QID) | RECTAL | 0 refills | Status: DC | PRN

## 2024-08-09 MED ORDER — PROMETHAZINE HCL 12.5 MG PO TABS: 12.5000 mg | ORAL_TABLET | Freq: Four times a day (QID) | ORAL | 0 refills | Status: AC | PRN

## 2024-08-09 NOTE — Progress Notes (Signed)
 Patient Care Team: Tysinger, Alm GORMAN RIGGERS as PCP - General (Family Medicine) Michele Richardson, DO as PCP - Cardiology (Cardiology)  Visit Date: 08/09/2024  Subjective:  No chief complaint on file.  There were no vitals filed for this visit. Patient PI:Alisha Coffey,Female DOB:13-Mar-1976,48 y.o. FMW:[id number]   48 y.o.Female presents today for acute sick visit with vomiting. I am seeing this patient on behalf of PA-C Alm Gent, who is this patient's PCP and was unavailable today. Patient has a past medical history of Hypertension. Says that her vomiting began 08/08/2024 at noon an hour after starting her Wegovy  1.7 mg. This has occurred before after an increase. The dose and frequency has since been changed (dose decreased to previous and frequency to every other week). She says that the Zofran  PO that she was given has not had a chance to work as she is vomiting too frequently; has vomited about 14-15 times and is currently vomiting bile only. Denies dizziness upon standing or fevers/chills. She notes that she has plans to travel to [place]  this weekend for her daughter's sports game.  Her blood pressure is elevated today above 140/100  Phenergan  out of stock, suppositories at pharmacy  Does take BP meds but can't keep meds down due to vomiting     Patient has hx of HTN, seasonal allergic rhinitis, mechanical back pain, impaired glucose tolerance, hyperlipidemia,hidradentitis suppurativa,obesity    Past Medical History:  Diagnosis Date   Acne    Anxiety    Chronic headache 2018   improved after treatment for anxiety 2018   History of sinusitis    Hypertension    Insomnia    Joint pain    Obesity    Uterine fibroid     Allergies[1] Immunization History  Administered Date(s) Administered   Influenza, Seasonal, Injecte, Preservative Fre 05/24/2023, 06/19/2024   Influenza,inj,Quad PF,6+ Mos 09/04/2014, 05/12/2017, 05/20/2022   PFIZER(Purple Top)SARS-COV-2 Vaccination  02/19/2020, 03/11/2020   Pfizer(Comirnaty)Fall Seasonal Vaccine 12 years and older 05/24/2023, 06/19/2024   Pneumococcal Polysaccharide-23 08/20/2019   Tdap 11/24/2017   Past Surgical History:  Procedure Laterality Date   ABDOMINAL HYSTERECTOMY N/A 09/07/2020   Procedure: HYSTERECTOMY ABDOMINAL;  Surgeon: Bettina Muskrat, MD;  Location: [facility] [place];  Service: Gynecology;  Laterality: N/A;   BILATERAL SALPINGECTOMY Bilateral 09/07/2020   Procedure: OPEN BILATERAL SALPINGECTOMY;  Surgeon: Bettina Muskrat, MD;  Location: [facility];  Service: Gynecology;  Laterality: Bilateral;   BREAST REDUCTION SURGERY  2009, 2000   HERNIA REPAIR     REDUCTION MAMMAPLASTY Bilateral 2000   REDUCTION MAMMAPLASTY Bilateral 12/2007   TRIGGER FINGER RELEASE Bilateral 2024   Nov 1st and 21st   WISDOM TOOTH EXTRACTION      Family History  Problem Relation Age of Onset   Hypertension Mother    Kidney Stones Mother    Stroke Maternal Aunt    Depression Maternal Grandmother    Hypertension Maternal Grandmother    Diabetes Maternal Grandfather    Hypertension Maternal Grandfather    Cancer Neg Hx    Heart disease Neg Hx    BRCA 1/2 Neg Hx    Breast cancer Neg Hx    Colon cancer Neg Hx    Rectal cancer Neg Hx    Stomach cancer Neg Hx    Esophageal cancer Neg Hx    Social History   Social History Narrative   Single.  2 daughters, Willeen Dan.  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  HENT:  Negative for congestion.   Eyes:  Negative for blurred vision.  Respiratory:  Negative for cough and shortness of breath.   Cardiovascular:  Negative for chest pain, palpitations and leg swelling.  Gastrointestinal:  Negative for vomiting.  Musculoskeletal:  Negative for back pain.  Skin:  Negative for rash.  Neurological:  Negative for loss of consciousness and headaches.      Objective:  Vitals: LMP 11/12/2018  BP 150/100 repeated 140/100, 130/100;  pulse 102 regular, T 98.5 degrees, pulse ox 98%, BMI 29.16 Physical Exam Vitals and nursing note reviewed.  Constitutional:      General: She is not in acute distress.    Appearance: Normal appearance. She is not toxic-appearing.  HENT:     Head: Normocephalic and atraumatic.  Pulmonary:     Effort: Pulmonary effort is normal.  Skin:    General: Skin is warm and dry.  Neurological:     Mental Status: She is alert and oriented to person, place, and time. Mental status is at baseline.  Psychiatric:        Mood and Affect: Mood normal.        Behavior: Behavior normal.        Thought Content: Thought content normal.        Judgment: Judgment normal.     Results:  Studies Obtained And Personally Reviewed By Me:    Labs:  CBC w/ Differential Lab Results  Component Value Date   WBC 4.5 05/24/2023   RBC 4.38 05/24/2023   HGB 13.8 05/24/2023   HCT 41.5 05/24/2023   PLT 209 05/24/2023   MCV 95 05/24/2023   MCH 31.5 05/24/2023   MCHC 33.3 05/24/2023   RDW 12.7 05/24/2023   LYMPHSABS 1.5 05/19/2022   MONOABS 0.1 12/02/2013   BASOSABS 0.1 05/19/2022    Comprehensive Metabolic Panel Lab Results  Component Value Date   NA 137 02/27/2024   K 3.6 02/27/2024   CL 99 02/27/2024   CO2 19 (L) 02/27/2024   GLUCOSE 103 (H) 02/27/2024   BUN 12 02/27/2024   CREATININE 0.90 02/27/2024   CALCIUM  9.9 02/27/2024   PROT 7.3 02/27/2024   ALBUMIN  4.6 02/27/2024   AST 22 02/27/2024   ALT 21 02/27/2024   ALKPHOS 65 02/27/2024   BILITOT 0.5 02/27/2024   EGFR 80 02/27/2024   GFRNONAA >60 09/08/2020   Lipid Panel  Lab Results  Component Value Date   CHOL 141 05/24/2023   HDL 50 05/24/2023   LDLCALC 73 05/24/2023   TRIG 94 05/24/2023   A1c Lab Results  Component Value Date   HGBA1C 5.8 (H) 05/24/2023    TSH Lab Results  Component Value Date   TSH 2.830 05/24/2023   PSA No results found for any  visits on 08/09/24. Assessment & Plan:   Nausea and Vomiting due to GLP-1 medication  Mild volume depletion  Hx of hyperlipidemia treated with statin  Mild HTN treated with Maxzide 25  Hx of migraine headaches  Mild obesity BMI 20.16  Hx of glucose intolerance Hgb AIC was 5.8% in Sept 2024  Anxiety - treated with Xanax  1 mg daily as needed  Plan: Zofran  4 mg IM injection given. Phenergan  suppository 12.5 mg per rectum every 4-6 hours as needed For nausea and vomiting. Do not think she needs IVF at this time. B-met drawn.            I,Emily Lagle,acting  as a scribe for Ronal JINNY Hailstone, MD.,have documented all relevant documentation on the behalf of Ronal JINNY Hailstone, MD,as directed by  Ronal JINNY Hailstone, MD while in the presence of Ronal JINNY Hailstone, MD.  I, Ronal JINNY Hailstone, MD, have reviewed all documentation for this visit. The documentation on 08/09/2024 for the exam, diagnosis, procedures, and orders are all accurate and complete.     [1]  Allergies Allergen Reactions   Hydrocodone -Acetaminophen  Nausea And Vomiting    Gets nauseated the whole time taking and occasionally has vomited on it   Diazepam  Nausea And Vomiting and Other (See Comments)   Propoxyphene Itching   Suvorexant  Other (See Comments)    Didn't tolerate   Tramadol  Nausea And Vomiting

## 2024-08-09 NOTE — Telephone Encounter (Signed)
 FYI Only or Action Required?: FYI only for provider: appointment scheduled on this afternoon.  Patient was last seen in primary care on 06/19/2024 by Bulah Alm RAMAN, PA-C.  Called Nurse Triage reporting Vomiting.  Symptoms began about 1 hour after taking WEgovy .  Interventions attempted: Prescription medications: unable to keep medication down, has also tried several home remedies w/o success  Symptoms are: unchanged.  Triage Disposition: See HCP Within 4 Hours (Or PCP Triage)  Patient/caregiver understands and will follow disposition?: Yes                         Copied from CRM #[id number]. Topic: Clinical - Red Word Triage >> Aug 09, 2024  8:24 AM Alisha Coffey wrote: Red Word that prompted transfer to Nurse Triage: Patient states since yesterday around noon has had uncontrollable vomiting and a fever. Has vomited at least 10 times, thinks it may be a side effect from Wegovy . Has an anti-nausea medication but has been unable to keep the medication down. Reason for Disposition  [1] Vomiting AND [2] contains bile (green color)  Answer Assessment - Initial Assessment Questions 1. VOMITING SEVERITY: How many times have you vomited in the past 24 hours?      More than 10 times - vomiting every 30 minutes 2. ONSET: When did the vomiting begin?      About 1 hour after taking her wegovy  3. FLUIDS: What fluids or food have you vomited up today? Have you been able to keep any fluids down?     no 4. ABDOMEN PAIN: Are your having any abdomen pain? If Yes : How bad is it and what does it feel like? (e.g., crampy, dull, intermittent, constant)      Did not ask 5. DIARRHEA: Is there any diarrhea? If Yes, ask: How many times today?      Did not ask 6. CONTACTS: Is there anyone else in the family with the same symptoms?      no 7. CAUSE: What do you think is causing your vomiting?     wegovy  8. HYDRATION STATUS: Any signs of dehydration? (e.g., dry  mouth [not only dry lips], too weak to stand) When did you last urinate?     Did not ask 9. OTHER SYMPTOMS: Do you have any other symptoms? (e.g., fever, headache, vertigo, vomiting blood or coffee grounds, recent head injury)     Vomiting bile  Protocols used: Vomiting-A-AH

## 2024-08-09 NOTE — Patient Instructions (Addendum)
 Patient given Zofran  injection in the office today. Prescribed Phenergan  suppositories 12.5 mg- may use q 4-6 hours as needed. Basic metabolic panel ordered. Try to hydrate with liquids other than water such as ginger ale or Sprite. Call [facility] if symptoms nio improving in 12-24 hours or sooner if worse.

## 2024-08-09 NOTE — Progress Notes (Signed)
 Patient Care Team: Tysinger, Alm RAMAN, PA-C as PCP - General (Family Medicine) Michele Richardson, DO as PCP - Cardiology (Cardiology)  Visit Date: 08/09/2024  Subjective:   Chief Complaint  Patient presents with   Vomiting   Medication Reaction    After taking WEGOVY    Vitals:   08/09/24 1134 08/09/24 1142  BP: (!) 150/100 (!) 140/100   Patient PI:Ryjwzo D Durell,Female DOB:1975/11/28,48 y.o. FMW:[id number]   48 y.o.Female presents today for acute sick visit with vomiting. I am seeing this patient on behalf of PA-C Alm Gent, who is this patient's PCP and was unavailable today. Patient has a past medical history of Hypertension. Says that her vomiting began 08/08/2024 at noon an hour after starting her Wegovy  1.7 mg. This has occurred before after an increase. The dose and frequency has since been changed (dose decreased to previous and frequency to every other week). She says that the Zofran  PO that she was given has not had a chance to work as she is vomiting too frequently; has vomited about 14-15 times and is currently vomiting bile only. Denies dizziness upon standing or fevers/chills. She notes that she has plans to travel to [place]  soon for her daughter's sports game. She says that her PCP did send in antinausea medications, but she was notified that one of them is out of stock.  Her blood pressure is elevated today above 140/100. She does have a history of Hypertension, for which she takes Triamterene -HCTZ37.5-25 mg daily. She attributes her elevated blood pressure to her persistent vomiting.   Past Medical History:  Diagnosis Date   Acne    Anxiety    Chronic headache 2018   improved after treatment for anxiety 2018   History of sinusitis    Hypertension    Insomnia    Joint pain    Obesity    Uterine fibroid     Allergies[1] Immunization History  Administered Date(s) Administered   Influenza, Seasonal, Injecte, Preservative Fre 05/24/2023, 06/19/2024    Influenza,inj,Quad PF,6+ Mos 09/04/2014, 05/12/2017, 05/20/2022   PFIZER(Purple Top)SARS-COV-2 Vaccination 02/19/2020, 03/11/2020   Pfizer(Comirnaty)Fall Seasonal Vaccine 12 years and older 05/24/2023, 06/19/2024   Pneumococcal Polysaccharide-23 08/20/2019   Tdap 11/24/2017   Past Surgical History:  Procedure Laterality Date   ABDOMINAL HYSTERECTOMY N/A 09/07/2020   Procedure: HYSTERECTOMY ABDOMINAL;  Surgeon: Bettina Muskrat, MD;  Location: [facility] [place];  Service: Gynecology;  Laterality: N/A;   BILATERAL SALPINGECTOMY Bilateral 09/07/2020   Procedure: OPEN BILATERAL SALPINGECTOMY;  Surgeon: Bettina Muskrat, MD;  Location: [facility] [place];  Service: Gynecology;  Laterality: Bilateral;   BREAST REDUCTION SURGERY  2009, 2000   HERNIA REPAIR     REDUCTION MAMMAPLASTY Bilateral 2000   REDUCTION MAMMAPLASTY Bilateral 12/2007   TRIGGER FINGER RELEASE Bilateral 2024   Nov 1st and 21st   WISDOM TOOTH EXTRACTION      Family History  Problem Relation Age of Onset   Hypertension Mother    Kidney Stones Mother    Stroke Maternal Aunt    Depression Maternal Grandmother    Hypertension Maternal Grandmother    Diabetes Maternal Grandfather    Hypertension Maternal Grandfather    Cancer Neg Hx    Heart disease Neg Hx    BRCA 1/2 Neg Hx    Breast cancer Neg Hx    Colon cancer Neg Hx    Rectal cancer Neg Hx    Stomach cancer Neg Hx    Esophageal cancer Neg Hx    Social History  Social History Narrative   Single.  2 daughters, Willeen Dan.  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  HENT:  Negative for congestion.   Eyes:  Negative for blurred vision.  Respiratory:  Negative for cough and shortness of breath.   Cardiovascular:  Negative for chest pain, palpitations and leg swelling.  Gastrointestinal:  Negative for vomiting.   Musculoskeletal:  Negative for back pain.  Skin:  Negative for rash.  Neurological:  Negative for loss of consciousness and headaches.     Objective:  Vitals: BP (!) 140/100 (Patient Position: Supine)   Pulse (!) 102   Temp 98.5 F (36.9 C)   Ht 5' 7 (1.702 m)   LMP 11/12/2018   SpO2 98%   BMI 29.16 kg/m   Physical Exam Vitals and nursing note reviewed.  Constitutional:      General: She is not in acute distress.    Appearance: Normal appearance. She is not ill-appearing or toxic-appearing.  HENT:     Head: Normocephalic and atraumatic.     Right Ear: Tympanic membrane, ear canal and external ear normal.     Left Ear: Tympanic membrane, ear canal and external ear normal.     Mouth/Throat:     Mouth: Mucous membranes are moist.     Pharynx: Oropharynx is clear. No oropharyngeal exudate or posterior oropharyngeal erythema.  Pulmonary:     Effort: Pulmonary effort is normal.     Breath sounds: Normal breath sounds. No wheezing, rhonchi or rales.  Lymphadenopathy:     Cervical: No cervical adenopathy.  Skin:    General: Skin is warm and dry.  Neurological:     Mental Status: She is alert and oriented to person, place, and time. Mental status is at baseline.  Psychiatric:        Mood and Affect: Mood normal.        Behavior: Behavior normal.        Thought Content: Thought content normal.        Judgment: Judgment normal.     Results:  Studies Obtained And Personally Reviewed By Me: Labs:  CBC w/ Differential Lab Results  Component Value Date   WBC 4.5 05/24/2023   RBC 4.38 05/24/2023   HGB 13.8 05/24/2023   HCT 41.5 05/24/2023   PLT 209 05/24/2023   MCV 95 05/24/2023   MCH 31.5 05/24/2023   MCHC 33.3 05/24/2023   RDW 12.7 05/24/2023   LYMPHSABS 1.5 05/19/2022   MONOABS 0.1 12/02/2013   BASOSABS 0.1 05/19/2022    Comprehensive Metabolic Panel Lab Results  Component Value Date   NA 137 02/27/2024   K 3.6 02/27/2024   CL 99 02/27/2024   CO2 19 (L)  02/27/2024   GLUCOSE 103 (H) 02/27/2024   BUN 12 02/27/2024   CREATININE 0.90 02/27/2024   CALCIUM  9.9 02/27/2024   PROT 7.3 02/27/2024   ALBUMIN  4.6 02/27/2024   AST 22 02/27/2024   ALT 21 02/27/2024   ALKPHOS 65 02/27/2024   BILITOT 0.5 02/27/2024   EGFR 80 02/27/2024   GFRNONAA >60 09/08/2020   Lipid Panel  Lab Results  Component Value Date   CHOL 141 05/24/2023   HDL 50 05/24/2023   LDLCALC 73 05/24/2023   TRIG 94 05/24/2023   A1c Lab Results  Component Value Date   HGBA1C 5.8 (H) 05/24/2023    TSH Lab Results  Component Value Date  TSH 2.830 05/24/2023   No results found for any visits on 08/09/24. Assessment & Plan:   Orders Placed This Encounter  Procedures   Basic Metabolic Panel    Meds ordered this encounter  Medications   promethazine  (PHENERGAN ) 12.5 MG suppository    Sig: Place 1 suppository (12.5 mg total) rectally every 6 (six) hours as needed for nausea or vomiting.    Dispense:  6 each    Refill:  0   ondansetron  (ZOFRAN ) injection 4 mg   Nausea and Vomiting due to Wegovy ; Intravascular Volume Depletion due to nausea and vomiting: Symptoms began 08/08/2024 at noon, one hour after taking Wegovy  1.7 mg, an increased dose. She has experienced this side effect before, but not to this degree and she has responsibilities tomorrow that she needs to be able to drive for. Mouth is slightly moist on exam, but patient does endorse vomiting about 14 times since onset. Has not been able to use Zofran  PO due to vomiting and requests a suppository instead. No dizziness, fevers, or chills associated with nausea and vomiting. Her blood pressure is slightly elevated today, which is likely due to her vomiting.  Ordering basic metabolic panel to evaluate electrolytes due to amount of times she has vomited. Instructed her to avoid food/drinks that would trigger vomiting, such as water, dairy, or orange juice, and instead have Ginger Ale. Given Zofran  injection in  office today to provide rapid nausea relief. Sending in Phenergan  12.5 mg suppositories to use as needed every 6 hours. Continue antihypertensives and monitor blood pressure.    I,Emily Lagle,acting as a neurosurgeon for Ronal JINNY Hailstone, MD.,have documented all relevant documentation on the behalf of Ronal JINNY Hailstone, MD,as directed by  Ronal JINNY Hailstone, MD while in the presence of Ronal JINNY Hailstone, MD.  I, Ronal JINNY Hailstone, MD, have reviewed all documentation for this visit. The documentation on 08/09/2024 for the exam, diagnosis, procedures, and orders are all accurate and complete.      [1] Allergies Allergen Reactions   Hydrocodone -Acetaminophen  Nausea And Vomiting    Gets nauseated the whole time taking and occasionally has vomited on it   Diazepam  Nausea And Vomiting and Other (See Comments)   Propoxyphene Itching   Suvorexant  Other (See Comments)    Didn't tolerate   Tramadol  Nausea And Vomiting

## 2024-08-09 NOTE — Telephone Encounter (Signed)
 Done

## 2024-08-10 LAB — BASIC METABOLIC PANEL WITH GFR
BUN: 16 mg/dL (ref 7–25)
CO2: 27 mmol/L (ref 20–32)
Calcium: 9.8 mg/dL (ref 8.6–10.2)
Chloride: 101 mmol/L (ref 98–110)
Creat: 0.83 mg/dL (ref 0.50–0.99)
Glucose, Bld: 86 mg/dL (ref 65–99)
Potassium: 3.4 mmol/L — ABNORMAL LOW (ref 3.5–5.3)
Sodium: 137 mmol/L (ref 135–146)
eGFR: 88 mL/min/1.73m2 (ref >=60)

## 2024-08-11 ENCOUNTER — Ambulatory Visit: Payer: Self-pay | Admitting: Internal Medicine

## 2024-08-12 ENCOUNTER — Encounter: Payer: Self-pay | Admitting: Internal Medicine

## 2024-08-13 ENCOUNTER — Other Ambulatory Visit: Payer: Self-pay | Admitting: Medical

## 2024-08-20 ENCOUNTER — Ambulatory Visit
Admission: RE | Admit: 2024-08-20 | Discharge: 2024-08-20 | Disposition: A | Discharge status: Home / Self Care | Source: Ambulatory Visit | Attending: Medical | Admitting: Medical

## 2024-08-20 ENCOUNTER — Ambulatory Visit

## 2024-08-20 DIAGNOSIS — Z1231 Encounter for screening mammogram for malignant neoplasm of breast: Secondary | ICD-10-CM

## 2024-08-27 ENCOUNTER — Ambulatory Visit: Payer: Self-pay | Admitting: Medical

## 2024-08-27 NOTE — Progress Notes (Signed)
 Called Patient and gave results.
